# Patient Record
Sex: Female | Born: 1950
Health system: Southern US, Community
[De-identification: ages and names within clinical notes are randomized; demographics above are authoritative.]

## PROBLEM LIST (undated history)

## (undated) DIAGNOSIS — I10 Essential (primary) hypertension: Secondary | ICD-10-CM

## (undated) DIAGNOSIS — N200 Calculus of kidney: Secondary | ICD-10-CM

## (undated) DIAGNOSIS — R112 Nausea with vomiting, unspecified: Secondary | ICD-10-CM

## (undated) DIAGNOSIS — R519 Headache, unspecified: Secondary | ICD-10-CM

## (undated) DIAGNOSIS — L409 Psoriasis, unspecified: Secondary | ICD-10-CM

## (undated) DIAGNOSIS — Z91018 Allergy to other foods: Secondary | ICD-10-CM

## (undated) DIAGNOSIS — Z9889 Other specified postprocedural states: Secondary | ICD-10-CM

## (undated) DIAGNOSIS — E8809 Other disorders of plasma-protein metabolism, not elsewhere classified: Secondary | ICD-10-CM

## (undated) DIAGNOSIS — F32A Depression, unspecified: Secondary | ICD-10-CM

## (undated) DIAGNOSIS — Z9861 Coronary angioplasty status: Principal | ICD-10-CM

## (undated) DIAGNOSIS — F329 Major depressive disorder, single episode, unspecified: Secondary | ICD-10-CM

## (undated) DIAGNOSIS — A4902 Methicillin resistant Staphylococcus aureus infection, unspecified site: Secondary | ICD-10-CM

## (undated) DIAGNOSIS — F411 Generalized anxiety disorder: Secondary | ICD-10-CM

## (undated) DIAGNOSIS — I2510 Atherosclerotic heart disease of native coronary artery without angina pectoris: Secondary | ICD-10-CM

## (undated) DIAGNOSIS — I219 Acute myocardial infarction, unspecified: Secondary | ICD-10-CM

## (undated) DIAGNOSIS — E785 Hyperlipidemia, unspecified: Secondary | ICD-10-CM

## (undated) DIAGNOSIS — G4700 Insomnia, unspecified: Secondary | ICD-10-CM

## (undated) DIAGNOSIS — F419 Anxiety disorder, unspecified: Secondary | ICD-10-CM

## (undated) DIAGNOSIS — I214 Non-ST elevation (NSTEMI) myocardial infarction: Secondary | ICD-10-CM

## (undated) DIAGNOSIS — R079 Chest pain, unspecified: Secondary | ICD-10-CM

## (undated) DIAGNOSIS — R51 Headache: Secondary | ICD-10-CM

## (undated) DIAGNOSIS — T8859XA Other complications of anesthesia, initial encounter: Secondary | ICD-10-CM

## (undated) DIAGNOSIS — T4145XA Adverse effect of unspecified anesthetic, initial encounter: Secondary | ICD-10-CM

## (undated) HISTORY — DX: Atherosclerotic heart disease of native coronary artery without angina pectoris: I25.10

## (undated) HISTORY — DX: Major depressive disorder, single episode, unspecified: F32.9

## (undated) HISTORY — DX: Insomnia, unspecified: G47.00

## (undated) HISTORY — DX: Depression, unspecified: F32.A

## (undated) HISTORY — DX: Anxiety disorder, unspecified: F41.9

## (undated) HISTORY — PX: BREAST BIOPSY: SHX20

## (undated) HISTORY — PX: APPENDECTOMY: SHX54

## (undated) HISTORY — DX: Psoriasis, unspecified: L40.9

## (undated) HISTORY — PX: INCISION AND DRAINAGE FOOT: SHX1800

## (undated) HISTORY — PX: NOSE SURGERY: SHX723

## (undated) HISTORY — DX: Coronary angioplasty status: Z98.61

## (undated) HISTORY — DX: Methicillin resistant Staphylococcus aureus infection, unspecified site: A49.02

## (undated) HISTORY — DX: Generalized anxiety disorder: F41.1

---

## 1977-08-07 HISTORY — PX: DILATION AND CURETTAGE OF UTERUS: SHX78

## 1999-07-08 ENCOUNTER — Encounter: Admission: RE | Admit: 1999-07-08 | Discharge: 1999-07-08 | Payer: Self-pay | Admitting: *Deleted

## 1999-07-08 ENCOUNTER — Encounter: Payer: Self-pay | Admitting: *Deleted

## 2000-02-09 ENCOUNTER — Other Ambulatory Visit: Admission: RE | Admit: 2000-02-09 | Discharge: 2000-02-09 | Payer: Self-pay | Admitting: *Deleted

## 2000-07-24 ENCOUNTER — Encounter: Admission: RE | Admit: 2000-07-24 | Discharge: 2000-07-24 | Payer: Self-pay | Admitting: *Deleted

## 2000-07-24 ENCOUNTER — Encounter: Payer: Self-pay | Admitting: *Deleted

## 2001-04-01 ENCOUNTER — Other Ambulatory Visit: Admission: RE | Admit: 2001-04-01 | Discharge: 2001-04-01 | Payer: Self-pay | Admitting: *Deleted

## 2001-08-19 ENCOUNTER — Encounter: Admission: RE | Admit: 2001-08-19 | Discharge: 2001-08-19 | Payer: Self-pay | Admitting: *Deleted

## 2001-08-19 ENCOUNTER — Encounter: Payer: Self-pay | Admitting: *Deleted

## 2001-08-23 ENCOUNTER — Encounter: Payer: Self-pay | Admitting: *Deleted

## 2001-08-23 ENCOUNTER — Encounter: Admission: RE | Admit: 2001-08-23 | Discharge: 2001-08-23 | Payer: Self-pay | Admitting: *Deleted

## 2002-02-13 ENCOUNTER — Encounter: Payer: Self-pay | Admitting: *Deleted

## 2002-02-13 ENCOUNTER — Encounter: Admission: RE | Admit: 2002-02-13 | Discharge: 2002-02-13 | Payer: Self-pay | Admitting: *Deleted

## 2002-09-16 ENCOUNTER — Encounter: Admission: RE | Admit: 2002-09-16 | Discharge: 2002-09-16 | Payer: Self-pay | Admitting: Radiology

## 2004-01-27 ENCOUNTER — Other Ambulatory Visit: Admission: RE | Admit: 2004-01-27 | Discharge: 2004-01-27 | Payer: Self-pay | Admitting: Obstetrics & Gynecology

## 2004-02-10 ENCOUNTER — Encounter: Admission: RE | Admit: 2004-02-10 | Discharge: 2004-02-10 | Payer: Self-pay | Admitting: Obstetrics & Gynecology

## 2005-05-01 ENCOUNTER — Encounter: Admission: RE | Admit: 2005-05-01 | Discharge: 2005-05-01 | Payer: Self-pay | Admitting: Obstetrics & Gynecology

## 2005-07-04 ENCOUNTER — Other Ambulatory Visit: Admission: RE | Admit: 2005-07-04 | Discharge: 2005-07-04 | Payer: Self-pay | Admitting: Obstetrics & Gynecology

## 2006-05-02 ENCOUNTER — Encounter: Admission: RE | Admit: 2006-05-02 | Discharge: 2006-05-02 | Payer: Self-pay | Admitting: Obstetrics & Gynecology

## 2007-05-13 ENCOUNTER — Encounter: Admission: RE | Admit: 2007-05-13 | Discharge: 2007-05-13 | Payer: Self-pay | Admitting: Obstetrics & Gynecology

## 2007-12-18 ENCOUNTER — Inpatient Hospital Stay (HOSPITAL_COMMUNITY): Admission: EM | Admit: 2007-12-18 | Discharge: 2007-12-24 | Payer: Self-pay | Admitting: Emergency Medicine

## 2007-12-18 ENCOUNTER — Ambulatory Visit: Payer: Self-pay | Admitting: Internal Medicine

## 2008-01-03 ENCOUNTER — Telehealth (INDEPENDENT_AMBULATORY_CARE_PROVIDER_SITE_OTHER): Payer: Self-pay | Admitting: Internal Medicine

## 2008-01-16 ENCOUNTER — Ambulatory Visit: Payer: Self-pay | Admitting: Internal Medicine

## 2008-01-17 DIAGNOSIS — F4321 Adjustment disorder with depressed mood: Secondary | ICD-10-CM | POA: Insufficient documentation

## 2008-01-17 DIAGNOSIS — L02619 Cutaneous abscess of unspecified foot: Secondary | ICD-10-CM | POA: Insufficient documentation

## 2008-01-17 DIAGNOSIS — L03119 Cellulitis of unspecified part of limb: Secondary | ICD-10-CM | POA: Insufficient documentation

## 2008-05-28 ENCOUNTER — Encounter: Admission: RE | Admit: 2008-05-28 | Discharge: 2008-05-28 | Payer: Self-pay | Admitting: Obstetrics & Gynecology

## 2009-02-12 ENCOUNTER — Ambulatory Visit: Payer: Self-pay | Admitting: Infectious Diseases

## 2009-02-12 ENCOUNTER — Encounter (INDEPENDENT_AMBULATORY_CARE_PROVIDER_SITE_OTHER): Payer: Self-pay | Admitting: Internal Medicine

## 2009-02-12 DIAGNOSIS — R030 Elevated blood-pressure reading, without diagnosis of hypertension: Secondary | ICD-10-CM | POA: Insufficient documentation

## 2009-02-12 DIAGNOSIS — T4145XA Adverse effect of unspecified anesthetic, initial encounter: Secondary | ICD-10-CM | POA: Insufficient documentation

## 2009-02-17 LAB — CONVERTED CEMR LAB
Calcium: 9.9 mg/dL (ref 8.4–10.5)
Chloride: 108 meq/L (ref 96–112)
Cholesterol: 298 mg/dL — ABNORMAL HIGH (ref 0–200)
LDL Cholesterol: 191 mg/dL — ABNORMAL HIGH (ref 0–99)
Potassium: 4.4 meq/L (ref 3.5–5.3)
Sodium: 142 meq/L (ref 135–145)
Total CHOL/HDL Ratio: 5.4
Triglycerides: 262 mg/dL — ABNORMAL HIGH (ref <150)
VLDL: 52 mg/dL — ABNORMAL HIGH (ref 0–40)

## 2009-02-23 ENCOUNTER — Encounter (INDEPENDENT_AMBULATORY_CARE_PROVIDER_SITE_OTHER): Payer: Self-pay | Admitting: Internal Medicine

## 2009-05-31 ENCOUNTER — Encounter (INDEPENDENT_AMBULATORY_CARE_PROVIDER_SITE_OTHER): Payer: Self-pay | Admitting: Internal Medicine

## 2009-05-31 ENCOUNTER — Encounter: Admission: RE | Admit: 2009-05-31 | Discharge: 2009-05-31 | Payer: Self-pay | Admitting: Obstetrics & Gynecology

## 2009-05-31 LAB — HM MAMMOGRAPHY

## 2010-06-01 ENCOUNTER — Encounter: Admission: RE | Admit: 2010-06-01 | Discharge: 2010-06-01 | Payer: Self-pay | Admitting: Obstetrics & Gynecology

## 2010-09-15 ENCOUNTER — Encounter: Payer: Self-pay | Admitting: Internal Medicine

## 2010-12-20 NOTE — Op Note (Signed)
NAME:  Laura Herrera, Laura Herrera NO.:  [id number]   MEDICAL RECORD NO.:  [id number]          PATIENT TYPE:  INP   LOCATION:  5159                         FACILITY:  [facility]   PHYSICIAN:  Madlyn Frankel. Charlann Boxer, M.D.  DATE OF BIRTH:  25-Dec-1950   DATE OF PROCEDURE:  12/21/2007  DATE OF DISCHARGE:                               OPERATIVE REPORT   PREOPERATIVE DIAGNOSIS:  Right dorsal foot abscess.   POSTOPERATIVE DIAGNOSIS:  Right dorsal foot abscess.   PROCEDURE:  Incision, debridement, and drainage of right foot abscess  obtaining cultures.   FINDINGS:  Purulence dorsal aspect of the right foot, tenuous wound  directly over abscess.   SURGEON:  Shelda Pal, MD   ASSISTANT:  None.   ANESTHESIA:  General LMA.   ESTIMATED BLOOD LOSS:  Minimal.   COMPLICATIONS:  None.   TOURNIQUET:  An Esmarch was utilized for exsanguination and tourniquet  use.  Tourniquet was only up for approximately 10 minutes.  The patient  was otherwise stable to recovery room without complication.   INDICATIONS FOR PROCEDURE:  Laura Herrera is a 60 year old female with 5-to  6-day progressively worsening pain, swelling, and erythema over the  dorsal aspect of foot with migration in the lower leg.  She was admitted  to the hospital on Dec 18, 2007 and placed on IV antibiotics with some  response.  Questionable whether or not this was a spider bite.  Reviewed  her risk and benefits of operative procedure, she wished to proceed with  the I&D of the foot in order to help obtain relief and identify cultures  and prevent further wound complications.  I discussed the risk and wound  breakdown, potential slough, and the need for skin grafting versus  secondary wound closure.  Consent was obtained.   PROCEDURE IN DETAIL:  The patient was brought to the operative theater.  Once the adequate anesthesia was established, the patient was positioned  supine.  The patient had been receiving doxycycline on the floor.   No  preoperative meds given per se.  The right lower extremity was then  prepped and draped in sterile fashion from the toes to the upper leg.  Esmarch tourniquet was used to exsanguinate the leg, but then used as a  tourniquet.   I made a 2-cm incision lateral to the area of her most significant wound  at the center portion of the cellulitic change.  I used a tonsil to open  up and identified purulent material.  Cultures were obtained for aerobic  and anaerobic evaluation.  Following debridement of some of the tissue  in this wound, I irrigated the wound out with 2 L of normal saline  solution.  Further debridement was carried out as necessary.  Two single  interrupted nylon suture were then placed on the wounds for  reapproximation and sealed wound.  I then cleaned the foot and dressed  it with some  Xeroform and then dressings in a Kerlix.  She was awoken from anesthesia  and brought to the recovery room in stable condition.  She will continue  on her  current medications, pain medicines, and be weightbearing as  tolerated with the use of a cast shoe.      Madlyn Frankel Charlann Boxer, M.D.  Electronically Signed     MDO/MEDQ  D:  12/21/2007  T:  12/21/2007  Job:  [id number]

## 2010-12-20 NOTE — Consult Note (Signed)
NAME:  CALENE, PARADISO NO.:  [id number]   MEDICAL RECORD NO.:  [id number]          PATIENT TYPE:  INP   LOCATION:  5159                         FACILITY:  [facility]   PHYSICIAN:  Madlyn Frankel. Charlann Boxer, M.D.  DATE OF BIRTH:  01-27-1951   DATE OF CONSULTATION:  12/21/2007  DATE OF DISCHARGE:                                 CONSULTATION   CHIEF COMPLAINT:  Right foot abscess.   HISTORY OF PRESENT ILLNESS:  Ms. Dress is a pleasant 60 year old female  who presented to the emergency room yesterday after persistent swelling  despite some treatment with oral antibiotics over the right dorsal foot,  and she reports a 5-day history of swelling and increasing pain after  going to dinner one evening, putting on some shoes that she has not worn  in a while.  She cannot recall exactly if there was a spider bite, but  gives it a high-end sense of possibility [place]  Nonetheless, she had an initial pimple appearance over  the dorsal foot, which increased in its erythema.  She had some  progressive erythema despite being placed on some antibiotics after seen  at an Urgent Care, and diagnosed with a staph infection.   Given the persistence of the swelling increasing into the leg, she came  to the emergency room.  She has denied any fevers, chills, or night  sweats.  She has not had any nausea or vomiting.  She is having  difficult time mobilizing due to the discomfort in this foot.  She is  very sore to report.   PAST MEDICAL HISTORY:  Includes psoriasis, history of a right leg staph  infection due to Enbrel injection for this psoriasis.   DRUG ALLERGIES:  Reaction to ANESTHESIA when she was 60 years old.   CURRENT MEDICATIONS:  At the time of admission included rifampin,  Vicodin, Altabax, Celexa, and Ambien.   SOCIAL HISTORY:  She is a former smoker.  Has two glasses of wine a day.  No other drug use.  Socially, she is widowed.  She works as an  Arts development officer.   FAMILY MEDICAL HISTORY:  Includes coronary artery disease and dementia.   PHYSICAL EXAMINATION:  GENERAL:  General medical exam is deferred due to  the medical admission note.  EXTREMITIES:  Examination of the extremities of the dorsal foot reveals  a significant area of erythema over the entire dorsal aspect of the  foot.  She says that it has improved with the use of doxycycline  currently.  She does have an area central that almost has a necrotic-  appearing skin area with elevation.  It is very tender.  She has intact  sensibility distally in her toes, and has palpable pulses in the  posterior tib.  Distribution of the dorsal pedal pulse is not palpable  due to the location of the abscess.   She has no significant streaking at this point, and no evidence of  migratory cellulitis at this point.   ASSESSMENT:  Right dorsal foot abscess.   LABS:  She had normal electrolyte values at the time of evaluation  without any kidney function failure or decline.  She had a white count  of 7.6, hematocrit of 37.2, and platelets 303.  Blood cultures performed  on Dec 18, 2007, are negative to date.   PLAN:  I reviewed Ms. Chenette current situation and plan.  I currently  recommended an I&D of this wound.  Obvious staph infections is of  concern as is the possibility of this being a brown recluse spider bite,  which has the potential to be locally a kind of aggressive.  I discussed  with her possible need for repeat I&D, and possible need for skin  grafting.   At this point, I will plan to do a simple I&D with primary partial wound  closure dressage.  Trace dressing applied.  Consult the Wound Care  Service for potential recommendations.  If the dorsal wound breaks down,  then initial wet-to-dry dressing will be attempted to try to get this to  heal with secondary intent.  Questions were encouraged and answered were  reviewed with Ms. Stubblefield this morning.  She will be  taken in, on  operating on this wound.  She is currently n.p.o.      Madlyn Frankel Charlann Boxer, M.D.  Electronically Signed     MDO/MEDQ  D:  12/21/2007  T:  12/21/2007  Job:  [id number]

## 2010-12-23 NOTE — Discharge Summary (Signed)
NAME:  Laura Herrera, PAGLIARO NO.:  [id number]   MEDICAL RECORD NO.:  [id number]          PATIENT TYPE:  INP   LOCATION:  5159                         FACILITY:  [facility]   PHYSICIAN:  Madaline Guthrie, M.D.    DATE OF BIRTH:  07-23-1951   DATE OF ADMISSION:  12/18/2007  DATE OF DISCHARGE:  12/24/2007                               DISCHARGE SUMMARY   DISCHARGE DIAGNOSES:  1. Cellulitis and abscess of right foot, positive for methicillin-      resistant Staphylococcus aureus.  2. Nausea and vomiting secondary to narcotics.  3. History of psoriasis.  4. History of depression and insomnia.   DISCHARGE MEDICATIONS:  1. Doxycycline 100 mg twice daily for 2 more weeks.  2. Vicodin 5/325 one to two tablets every 4-6 hours as required for      pain.  3. Zofran 4 mg 1 tablet as required for nausea up to 3 times a day.  4. Scopolamine patch 1.5 mg to apply behind the ear every 3 days.  5. Celexa 40 mg 1 tablet daily.  6. Ambien 10 mg 1 tablet daily.   DISPOSITION AND FOLLOWUP:  1. The patient will follow with Dr. Charlann Boxer, phone number [phone] in      about a week's time.  Dr. Nilsa Nutting office is going to call the      patient with a followup appointment.  2. The patient will follow with Dr. Polly Cobia at the [facility].  A followup appointment has been set for January 16, 2008 at 1:30 p.m.  At the followup visit, the patient will be      reviewed for resolution of her right foot wound.  Also the      patient's blood pressure will be monitored, as it was noted to be      on the higher side during this hospital admission.  Any other      primary care needs will be addressed at the followup visit.   STUDIES/PROCEDURES:  1. X-ray, right foot, Dec 18, 2007.  No acute bony findings, no      periosteal reaction, and no acute osseous findings.  2. Incision, debridement, and drainage of right foot abscess, Dec 21, 2007 performed by Dr. Charlann Boxer.   CONSULTATIONS:  Orthopedics, Dr. Shelda Pal.   ADMISSION HISTORY:  Ms. Righetti was an otherwise healthy 60 year old white  lady who presented to the ED on Dec 18, 2007 with 5-day history of right  foot swelling and redness.  It started as a small reddish bump on the  dorsum of her right foot that looked like an insect bite.  However, she  denied having been bitten by an insect.  The redness and swelling  gradually progressed to involve the entire right foot.  The patient went  to Urgent Care 2 days prior to admission where they diagnosed possible  staph infection and placed her on rifampin and Bactrim.  Other than the  leg swelling, she mentioned of fever with chills and sweats.  She denied  any nausea, vomiting, chest pain, shortness of breath, prolonged  immobility or previous history of DVT or pulmonary embolism, weight loss  or any problem with her bowel or bladder.  She did, however, mention of  having had staphylococcus infection on the right leg in the past.   PHYSICAL EXAMINATION:  VITAL SIGNS:  Temperature 97, blood pressure  144/75, pulse 76, respiratory rate 18, oxygen saturation 99% on room  air.  GENERAL:  NAD.  EYES:  EOMI, PERRL, no icterus, no pallor.  ENT:  Moist mucous membranes, oropharynx clear.  NECK:  Supple.  No cervical lymphadenopathy.  CHEST:  Clear to auscultation bilaterally with good air entry.  CARDIOVASCULAR:  Regular rate and rhythm.  Normal heart sounds.  No  murmur, rubs, or gallops.  ABDOMEN:  Soft, nontender, nondistended.  Positive bowel sounds.  EXTREMITIES:  Right foot grossly swollen, reddish/purplish color.  Inflammation extending up to mid part of right leg, warm and exquisitely  tender to touch, good capillary refill, sensation intact.  SKIN:  Scaly skin consistent with psoriasis on hand and sole of feet.  LYMPH:  No lymphadenopathy.  MUSCULOSKELETAL:  No joint or spine tenderness or swelling.  NEURO:  Alert and oriented x3.  Cranial  nerves II-XII intact.  No focal  motor or sensory deficits.  PSYCH:  Appropriate.   ADMISSION LABORATORY DATA:  Sodium 135, potassium 3.9, chloride 103,  bicarbonate 26, BUN 13, creatinine 0.89, blood glucose 102.  WBC 12.5,  ANC 10.5, hemoglobin 14.4, MCV 93.7, platelet 300.  Bilirubin 1.7, alk  phos 92, AST 26, ALT 35, protein 7.6, albumin 4.2, calcium 9.7.   HOSPITAL COURSE:  1. Cellulitis and abscess of right foot.  The patient was admitted to      a regular floor for further management on her problem since she had      already received a course of oral antibiotics.  It was likely that      she was having resistant infection namely MRSA.  She was initially      placed on broad-spectrum vancomycin and Zosyn.  She did not      tolerate vancomycin very well as she developed generalized      erythematous reaction to it.  She was given Benadryl and vancomycin      but were discontinued and switched to IV doxycycline.  Her Zosyn      was also discontinued on day 2.  A wound culture was obtained which      revealed few MRSA that was sensitive to doxycycline.  Her      cellulitis decreased remarkably after initiation of IV antibiotics      but swelling persisted.  An orthopedic consultation was done      following which she underwent incision and drainage procedure.      Following I and D, she progressed very well and upon discharge, she      was placed on oral antibiotics which she will continue for 2 more      weeks to complete a 3 weeks course.  2. Psoriasis.  This was stable.  No medications were given for this      particular problem.  3. Insomnia and depression.  Her regular medications of Effexor and      Ambien were continued.  4. Nausea and vomiting.  This was most likely related to strong      opiates that she was receiving.  This responded to Zofran and  scopolamine patch.  5. Blood pressure.  The patient's blood pressure was noted to be on      the higher side during the  hospital admission, but this might have      been related to her pain.  This will be monitored on outpatient      basis and if need be antihypertensive medicines will be considered.   DISCHARGE LABORATORY DATA:  WBC 6.7, hemoglobin 12, platelet 300.  Sodium 140, potassium 3.9, chloride 111, bicarbonate 26, glucose 105,  BUN 15, creatinine 0.77, calcium 9.3.   DISCHARGE VITAL SIGNS:  Temperature 98.1, pulse 66, respirations 18,  blood pressure 149/59, oxygen saturation 96% on room air.   The patient's condition at the day of discharge was quite stable. Her  nausea and vomiting had subsided, swelling and cellulitis on the right  foot had remarkably decreased, and she was not complaining of any pain.  She was mobile and was able to bear weight on her right side.      Zara Council, MD  Electronically Signed      Madaline Guthrie, M.D.  Electronically Signed    AS/MEDQ  D:  12/25/2007  T:  12/25/2007  Job:  [id number]   cc:   Madlyn Frankel Charlann Boxer, M.D.

## 2011-05-03 LAB — CBC
Hemoglobin: 12
Hemoglobin: 12
Hemoglobin: 13.7
MCHC: 34.7
MCHC: 35
MCV: 93.9
MCV: 94.4
MCV: 94.5
MCV: 94.5
Platelets: 255
Platelets: 303
Platelets: 371
RBC: 3.67 — ABNORMAL LOW
RBC: 4.13
RDW: 12.1
RDW: 12.4
WBC: 6.7
WBC: 7
WBC: 8.8

## 2011-05-03 LAB — BASIC METABOLIC PANEL
BUN: 10
BUN: 11
BUN: 13
CO2: 25
CO2: 26
Calcium: 9.3
Calcium: 9.6
Chloride: 106
Chloride: 107
Creatinine, Ser: 0.77
Creatinine, Ser: 0.78
Creatinine, Ser: 0.81
Creatinine, Ser: 0.88
GFR calc Af Amer: >60
GFR calc Af Amer: >60
GFR calc Af Amer: >60
GFR calc Af Amer: >60
GFR calc Af Amer: >60
GFR calc non Af Amer: >60
GFR calc non Af Amer: >60
Glucose, Bld: 105 — ABNORMAL HIGH
Potassium: 3.9
Sodium: 137
Sodium: 140
Sodium: 140

## 2011-05-03 LAB — DIFFERENTIAL
Basophils Relative: 0
Eosinophils Relative: 4
Monocytes Absolute: 0.7
Monocytes Relative: 8
Neutrophils Relative %: 54

## 2011-05-03 LAB — ANAEROBIC CULTURE

## 2011-05-03 LAB — CULTURE, ROUTINE-ABSCESS

## 2011-05-03 LAB — HEPATIC FUNCTION PANEL
Albumin: 3.4 — ABNORMAL LOW
Alkaline Phosphatase: 76
Bilirubin, Direct: 0.1
Total Protein: 6.2

## 2011-05-29 ENCOUNTER — Other Ambulatory Visit: Payer: Self-pay | Admitting: Obstetrics & Gynecology

## 2011-05-29 DIAGNOSIS — Z1231 Encounter for screening mammogram for malignant neoplasm of breast: Secondary | ICD-10-CM

## 2011-06-15 ENCOUNTER — Ambulatory Visit
Admission: RE | Admit: 2011-06-15 | Discharge: 2011-06-15 | Disposition: A | Discharge status: Home / Self Care | Payer: BC Managed Care – PPO | Source: Ambulatory Visit | Attending: Obstetrics & Gynecology | Admitting: Obstetrics & Gynecology

## 2011-06-15 DIAGNOSIS — Z1231 Encounter for screening mammogram for malignant neoplasm of breast: Secondary | ICD-10-CM

## 2012-06-12 ENCOUNTER — Other Ambulatory Visit: Payer: Self-pay | Admitting: Obstetrics & Gynecology

## 2012-06-12 DIAGNOSIS — Z1231 Encounter for screening mammogram for malignant neoplasm of breast: Secondary | ICD-10-CM

## 2012-07-02 ENCOUNTER — Ambulatory Visit
Admission: RE | Admit: 2012-07-02 | Discharge: 2012-07-02 | Disposition: A | Discharge status: Home / Self Care | Payer: BC Managed Care – PPO | Source: Ambulatory Visit | Attending: Family Medicine | Admitting: Family Medicine

## 2012-07-02 ENCOUNTER — Other Ambulatory Visit: Payer: Self-pay | Admitting: Family Medicine

## 2012-07-02 DIAGNOSIS — M79672 Pain in left foot: Secondary | ICD-10-CM

## 2012-07-23 ENCOUNTER — Ambulatory Visit
Admission: RE | Admit: 2012-07-23 | Discharge: 2012-07-23 | Disposition: A | Discharge status: Home / Self Care | Payer: BC Managed Care – PPO | Source: Ambulatory Visit | Attending: Obstetrics & Gynecology | Admitting: Obstetrics & Gynecology

## 2012-07-23 DIAGNOSIS — Z1231 Encounter for screening mammogram for malignant neoplasm of breast: Secondary | ICD-10-CM

## 2012-11-27 ENCOUNTER — Emergency Department (HOSPITAL_COMMUNITY)
Admission: EM | Admit: 2012-11-27 | Discharge: 2012-11-28 | Disposition: A | Discharge status: Home / Self Care | Payer: BC Managed Care – PPO | Attending: Emergency Medicine | Admitting: Emergency Medicine

## 2012-11-27 ENCOUNTER — Encounter (HOSPITAL_COMMUNITY): Payer: Self-pay | Admitting: *Deleted

## 2012-11-27 DIAGNOSIS — F411 Generalized anxiety disorder: Secondary | ICD-10-CM | POA: Insufficient documentation

## 2012-11-27 DIAGNOSIS — R0982 Postnasal drip: Secondary | ICD-10-CM | POA: Insufficient documentation

## 2012-11-27 DIAGNOSIS — Z872 Personal history of diseases of the skin and subcutaneous tissue: Secondary | ICD-10-CM | POA: Insufficient documentation

## 2012-11-27 DIAGNOSIS — J3489 Other specified disorders of nose and nasal sinuses: Secondary | ICD-10-CM | POA: Insufficient documentation

## 2012-11-27 DIAGNOSIS — G4700 Insomnia, unspecified: Secondary | ICD-10-CM | POA: Insufficient documentation

## 2012-11-27 DIAGNOSIS — Z8614 Personal history of Methicillin resistant Staphylococcus aureus infection: Secondary | ICD-10-CM | POA: Insufficient documentation

## 2012-11-27 DIAGNOSIS — Z79899 Other long term (current) drug therapy: Secondary | ICD-10-CM | POA: Insufficient documentation

## 2012-11-27 DIAGNOSIS — Z8659 Personal history of other mental and behavioral disorders: Secondary | ICD-10-CM | POA: Insufficient documentation

## 2012-11-27 DIAGNOSIS — J029 Acute pharyngitis, unspecified: Secondary | ICD-10-CM | POA: Insufficient documentation

## 2012-11-27 DIAGNOSIS — J309 Allergic rhinitis, unspecified: Secondary | ICD-10-CM | POA: Insufficient documentation

## 2012-11-27 DIAGNOSIS — H1045 Other chronic allergic conjunctivitis: Secondary | ICD-10-CM | POA: Insufficient documentation

## 2012-11-27 NOTE — ED Notes (Signed)
Pt states she has times where she can not breath well, very anxious upon arrival, feels she has some congestion, tearful in triage

## 2012-11-28 ENCOUNTER — Emergency Department (HOSPITAL_COMMUNITY): Payer: BC Managed Care – PPO

## 2012-11-28 MED ORDER — PREDNISONE 20 MG PO TABS
20.0000 mg | ORAL_TABLET | Freq: Once | ORAL | Status: AC
  Administered 2012-11-28: 20 mg via ORAL
  Filled 2012-11-28: qty 1

## 2012-11-28 MED ORDER — LORATADINE 10 MG PO TABS: 10.0000 mg | ORAL_TABLET | Freq: Every day | ORAL | Status: DC

## 2012-11-28 MED ORDER — PREDNISONE 20 MG PO TABS: 20.0000 mg | ORAL_TABLET | Freq: Every day | ORAL | Status: DC

## 2012-11-28 MED ORDER — BENZONATATE 100 MG PO CAPS
200.0000 mg | ORAL_CAPSULE | Freq: Once | ORAL | Status: AC
  Administered 2012-11-28: 200 mg via ORAL
  Filled 2012-11-28: qty 2

## 2012-11-28 MED ORDER — BENZONATATE 100 MG PO CAPS: 200.0000 mg | ORAL_CAPSULE | Freq: Three times a day (TID) | ORAL | Status: DC

## 2012-11-28 MED ORDER — IBUPROFEN 200 MG PO TABS
600.0000 mg | ORAL_TABLET | Freq: Once | ORAL | Status: AC
  Administered 2012-11-28: 600 mg via ORAL
  Filled 2012-11-28: qty 3

## 2012-11-28 MED ORDER — MORPHINE SULFATE 4 MG/ML IJ SOLN
INTRAMUSCULAR | Status: AC
  Filled 2012-11-28: qty 1

## 2012-11-28 MED ORDER — LORATADINE 10 MG PO TABS
10.0000 mg | ORAL_TABLET | Freq: Once | ORAL | Status: AC
  Administered 2012-11-28: 10 mg via ORAL
  Filled 2012-11-28: qty 1

## 2012-11-28 MED ORDER — PROPOFOL 10 MG/ML IV EMUL
INTRAVENOUS | Status: AC
  Filled 2012-11-28: qty 100

## 2012-11-28 MED ORDER — DIPHENHYDRAMINE HCL 25 MG PO CAPS
25.0000 mg | ORAL_CAPSULE | Freq: Once | ORAL | Status: AC
  Administered 2012-11-28: 25 mg via ORAL
  Filled 2012-11-28: qty 1

## 2012-11-28 NOTE — ED Notes (Signed)
Pt states last night started having a scratchy throat, then today had coughing episodes and throat still scratchy, states cough is dry, then states this evening has had 3 episodes where she feels like her throat closes up and she can't breathe, pt states her throat feels fine just a little scratchy at this time. Pt upset in room.

## 2012-11-28 NOTE — ED Provider Notes (Signed)
History     CSN: [id number]  Arrival date & time 11/27/12  2349   First MD Initiated Contact with Patient 11/28/12 0109      Chief Complaint  Patient presents with  . Shortness of Breath  . Sore Throat    HPI Laura Herrera is a 62 y.o. female arrives complaining of shortness of breath, sore throat, nasal congestion, itchy watery eyes. She says she's never had allergies before in her life. She denies any chest pain, productive cough, fevers or chills. Start a couple days ago has gotten worse. She has not taken anything for it. No history of venous thromboembolic disease, no history of hemoptysis, exogenous estrogens, or recent immobilization, fracture can't. No history of heart disease.  Past Medical History  Diagnosis Date  . Psoriasis   . MRSA infection     h/o MRSA infection in the right foot  . Anxiety   . Depression   . Insomnia     History reviewed. No pertinent past surgical history.  No family history on file.  History  Substance Use Topics  . Smoking status: Never Smoker   . Smokeless tobacco: Not on file  . Alcohol Use: Yes    OB History   Grav Para Term Preterm Abortions TAB SAB Ect Mult Living                  Review of Systems At least 10pt or greater review of systems completed and are negative except where specified in the HPI.  Allergies  Cholinesterase inhibitors and Vancomycin  Home Medications   Current Outpatient Rx  Name  Route  Sig  Dispense  Refill  . amLODipine (NORVASC) 2.5 MG tablet   Oral   Take 2.5 mg by mouth daily.         Marland Kitchen atorvastatin (LIPITOR) 10 MG tablet   Oral   Take 10 mg by mouth daily.         Marland Kitchen zolpidem (AMBIEN CR) 6.25 MG CR tablet   Oral   Take 6.25 mg by mouth at bedtime as needed for sleep.           BP 181/67  Pulse 67  Temp(Src) 98.3 F (36.8 C) (Oral)  Resp 20  Wt 180 lb (81.647 kg)  BMI 27.86 kg/m2  SpO2 100%  Physical Exam  Nursing notes reviewed.  Electronic medical record  reviewed. VITAL SIGNS:   Filed Vitals:   11/27/12 2351 11/28/12 0404 11/28/12 0447  BP: 181/67 164/67   Pulse: 67 72 67  Temp: 98.3 F (36.8 C) 98.8 F (37.1 C)   TempSrc: Oral Oral   Resp: 20 16 18   Weight: 180 lb (81.647 kg)    SpO2: 100% 95%    CONSTITUTIONAL: Awake, oriented x4, appears non-toxic HENT: Atraumatic, normocephalic, oral mucosa pink and moist, airway patent. Nares crusted with clear discharge, nasal turbinates are boggy and inflamed. External ears normal. TMs with small amount of clear fluid behind them. EYES: Conjunctiva injected, eyes are tearing constantly, EOMI, PERRLA NECK: Trachea midline, non-tender, supple, no lymphadenopathy CARDIOVASCULAR: Normal heart rate, Normal rhythm, No murmurs, rubs, gallops PULMONARY/CHEST: Clear to auscultation, no rhonchi, wheezes, or rales. Symmetrical breath sounds. Non-tender. ABDOMINAL: Non-distended, soft, non-tender - no rebound or guarding.  BS normal. NEUROLOGIC: Non-focal, moving all four extremities, no gross sensory or motor deficits. EXTREMITIES: No clubbing, cyanosis, or edema SKIN: Warm, Dry, No erythema, No rash  ED Course  Procedures (including critical care time)  Labs Reviewed -  No data to display No results found.   1. Allergic conjunctivitis and rhinitis, bilateral   2. Postnasal drip   3. Sore throat       MDM  Patient arrives anxious, says she cannot breathe well, does not have seasonal allergies however her physical exam is very consistent with environmental allergies/allergic rhinitis. Treated with prednisone, Claritin, Benadryl and Tessalon and reevaluate. Do not think this patient's shortness of breath is secondary to intrathoracic emergency at this time, she is low risk for PE, she's not complaining about any chest pains, she's afebrile, and nontoxic.  On reevaluation, the patient feels much better, is breathing much easier, she's not coughing, her eyes feel better she's not as congested. We'll  discharge her home on a regimen of selected antihistamines as well as a small prednisone burst to control her symptoms.  I explained the diagnosis and have given explicit precautions to return to the ER including recurring symptoms or any other new or worsening symptoms. The patient understands and accepts the medical plan as it's been dictated and I have answered their questions. Discharge instructions concerning home care and prescriptions have been given.  The patient is STABLE and is discharged to home in good condition.         Jones Skene, MD 11/28/12 [phone]

## 2013-07-07 ENCOUNTER — Other Ambulatory Visit: Payer: Self-pay

## 2013-07-07 DIAGNOSIS — Z1231 Encounter for screening mammogram for malignant neoplasm of breast: Secondary | ICD-10-CM

## 2013-08-12 ENCOUNTER — Ambulatory Visit
Admission: RE | Admit: 2013-08-12 | Discharge: 2013-08-12 | Disposition: A | Discharge status: Home / Self Care | Payer: BC Managed Care – PPO | Source: Ambulatory Visit

## 2013-08-12 DIAGNOSIS — Z1231 Encounter for screening mammogram for malignant neoplasm of breast: Secondary | ICD-10-CM

## 2013-09-29 ENCOUNTER — Ambulatory Visit
Admission: RE | Admit: 2013-09-29 | Discharge: 2013-09-29 | Disposition: A | Discharge status: Home / Self Care | Payer: BC Managed Care – PPO | Source: Ambulatory Visit | Attending: Family Medicine | Admitting: Family Medicine

## 2013-09-29 ENCOUNTER — Other Ambulatory Visit: Payer: Self-pay | Admitting: Family Medicine

## 2013-09-29 DIAGNOSIS — M79609 Pain in unspecified limb: Secondary | ICD-10-CM

## 2014-10-01 ENCOUNTER — Other Ambulatory Visit: Payer: Self-pay

## 2014-10-01 DIAGNOSIS — Z1231 Encounter for screening mammogram for malignant neoplasm of breast: Secondary | ICD-10-CM

## 2014-10-08 ENCOUNTER — Ambulatory Visit
Admission: RE | Admit: 2014-10-08 | Discharge: 2014-10-08 | Disposition: A | Discharge status: Home / Self Care | Payer: BLUE CROSS/BLUE SHIELD | Source: Ambulatory Visit

## 2014-10-08 ENCOUNTER — Other Ambulatory Visit: Payer: Self-pay

## 2014-10-08 DIAGNOSIS — Z1231 Encounter for screening mammogram for malignant neoplasm of breast: Secondary | ICD-10-CM

## 2015-01-06 DIAGNOSIS — Z9861 Coronary angioplasty status: Secondary | ICD-10-CM

## 2015-01-06 DIAGNOSIS — I219 Acute myocardial infarction, unspecified: Secondary | ICD-10-CM

## 2015-01-06 DIAGNOSIS — I2510 Atherosclerotic heart disease of native coronary artery without angina pectoris: Secondary | ICD-10-CM

## 2015-01-06 DIAGNOSIS — I2119 ST elevation (STEMI) myocardial infarction involving other coronary artery of inferior wall: Secondary | ICD-10-CM

## 2015-01-06 HISTORY — DX: Coronary angioplasty status: Z98.61

## 2015-01-06 HISTORY — DX: Atherosclerotic heart disease of native coronary artery without angina pectoris: I25.10

## 2015-01-06 HISTORY — DX: Acute myocardial infarction, unspecified: I21.9

## 2015-01-06 HISTORY — DX: ST elevation (STEMI) myocardial infarction involving other coronary artery of inferior wall: I21.19

## 2015-01-11 ENCOUNTER — Encounter (HOSPITAL_COMMUNITY)
Admission: EM | Disposition: A | Discharge status: Home / Self Care | Payer: BLUE CROSS/BLUE SHIELD | Source: Home / Self Care | Attending: Cardiology

## 2015-01-11 ENCOUNTER — Inpatient Hospital Stay (HOSPITAL_COMMUNITY)
Admission: EM | Admit: 2015-01-11 | Discharge: 2015-01-17 | DRG: 247 | Disposition: A | Discharge status: Home / Self Care | Payer: BLUE CROSS/BLUE SHIELD | Attending: Cardiology | Admitting: Cardiology

## 2015-01-11 ENCOUNTER — Ambulatory Visit (HOSPITAL_COMMUNITY): Admit: 2015-01-11 | Payer: Self-pay | Admitting: Cardiology

## 2015-01-11 ENCOUNTER — Encounter (HOSPITAL_COMMUNITY): Payer: Self-pay | Admitting: Internal Medicine

## 2015-01-11 DIAGNOSIS — Z6826 Body mass index (BMI) 26.0-26.9, adult: Secondary | ICD-10-CM

## 2015-01-11 DIAGNOSIS — F419 Anxiety disorder, unspecified: Secondary | ICD-10-CM | POA: Diagnosis present

## 2015-01-11 DIAGNOSIS — I10 Essential (primary) hypertension: Secondary | ICD-10-CM | POA: Diagnosis present

## 2015-01-11 DIAGNOSIS — Z87891 Personal history of nicotine dependence: Secondary | ICD-10-CM | POA: Diagnosis not present

## 2015-01-11 DIAGNOSIS — Z91013 Allergy to seafood: Secondary | ICD-10-CM

## 2015-01-11 DIAGNOSIS — L409 Psoriasis, unspecified: Secondary | ICD-10-CM | POA: Diagnosis present

## 2015-01-11 DIAGNOSIS — Z888 Allergy status to other drugs, medicaments and biological substances status: Secondary | ICD-10-CM

## 2015-01-11 DIAGNOSIS — I25110 Atherosclerotic heart disease of native coronary artery with unstable angina pectoris: Secondary | ICD-10-CM | POA: Diagnosis present

## 2015-01-11 DIAGNOSIS — Z881 Allergy status to other antibiotic agents status: Secondary | ICD-10-CM

## 2015-01-11 DIAGNOSIS — Z7982 Long term (current) use of aspirin: Secondary | ICD-10-CM

## 2015-01-11 DIAGNOSIS — G4700 Insomnia, unspecified: Secondary | ICD-10-CM | POA: Diagnosis present

## 2015-01-11 DIAGNOSIS — Z79899 Other long term (current) drug therapy: Secondary | ICD-10-CM

## 2015-01-11 DIAGNOSIS — Z7902 Long term (current) use of antithrombotics/antiplatelets: Secondary | ICD-10-CM

## 2015-01-11 DIAGNOSIS — F329 Major depressive disorder, single episode, unspecified: Secondary | ICD-10-CM | POA: Diagnosis present

## 2015-01-11 DIAGNOSIS — I213 ST elevation (STEMI) myocardial infarction of unspecified site: Secondary | ICD-10-CM | POA: Diagnosis not present

## 2015-01-11 DIAGNOSIS — Z8614 Personal history of Methicillin resistant Staphylococcus aureus infection: Secondary | ICD-10-CM | POA: Diagnosis not present

## 2015-01-11 DIAGNOSIS — E669 Obesity, unspecified: Secondary | ICD-10-CM | POA: Diagnosis present

## 2015-01-11 DIAGNOSIS — I2119 ST elevation (STEMI) myocardial infarction involving other coronary artery of inferior wall: Secondary | ICD-10-CM | POA: Diagnosis present

## 2015-01-11 DIAGNOSIS — Z9861 Coronary angioplasty status: Secondary | ICD-10-CM

## 2015-01-11 DIAGNOSIS — E785 Hyperlipidemia, unspecified: Secondary | ICD-10-CM | POA: Diagnosis present

## 2015-01-11 DIAGNOSIS — I2582 Chronic total occlusion of coronary artery: Secondary | ICD-10-CM | POA: Diagnosis present

## 2015-01-11 DIAGNOSIS — I2111 ST elevation (STEMI) myocardial infarction involving right coronary artery: Principal | ICD-10-CM | POA: Diagnosis present

## 2015-01-11 DIAGNOSIS — I2584 Coronary atherosclerosis due to calcified coronary lesion: Secondary | ICD-10-CM | POA: Diagnosis present

## 2015-01-11 DIAGNOSIS — Z955 Presence of coronary angioplasty implant and graft: Secondary | ICD-10-CM

## 2015-01-11 DIAGNOSIS — I2510 Atherosclerotic heart disease of native coronary artery without angina pectoris: Secondary | ICD-10-CM | POA: Insufficient documentation

## 2015-01-11 DIAGNOSIS — R079 Chest pain, unspecified: Secondary | ICD-10-CM | POA: Diagnosis present

## 2015-01-11 HISTORY — DX: Other specified postprocedural states: Z98.890

## 2015-01-11 HISTORY — DX: Hyperlipidemia, unspecified: E78.5

## 2015-01-11 HISTORY — PX: CARDIAC CATHETERIZATION: SHX172

## 2015-01-11 HISTORY — DX: Nausea with vomiting, unspecified: R11.2

## 2015-01-11 HISTORY — DX: Essential (primary) hypertension: I10

## 2015-01-11 LAB — CBC
HCT: 33.8 % — ABNORMAL LOW (ref 36.0–46.0)
HEMOGLOBIN: 11.3 g/dL — AB (ref 12.0–15.0)
MCH: 30.9 pg (ref 26.0–34.0)
MCHC: 33.4 g/dL (ref 30.0–36.0)
MCV: 92.3 fL (ref 78.0–100.0)
Platelets: 317 10*3/uL (ref 150–400)
RBC: 3.66 MIL/uL — ABNORMAL LOW (ref 3.87–5.11)
RDW: 12.8 % (ref 11.5–15.5)
WBC: 14 10*3/uL — AB (ref 4.0–10.5)

## 2015-01-11 SURGERY — LEFT HEART CATH AND CORONARY ANGIOGRAPHY
Anesthesia: LOCAL

## 2015-01-11 MED ORDER — ONDANSETRON HCL 4 MG/2ML IJ SOLN
4.0000 mg | Freq: Four times a day (QID) | INTRAMUSCULAR | Status: DC | PRN
  Administered 2015-01-12 – 2015-01-16 (×10): 4 mg via INTRAVENOUS
  Filled 2015-01-11 (×10): qty 2

## 2015-01-11 MED ORDER — FENTANYL CITRATE (PF) 100 MCG/2ML IJ SOLN
INTRAMUSCULAR | Status: DC | PRN
  Administered 2015-01-11 (×2): 25 ug via INTRAVENOUS

## 2015-01-11 MED ORDER — TIROFIBAN HCL IV 5 MG/100ML
INTRAVENOUS | Status: DC | PRN
  Administered 2015-01-11: 0.15 ug/kg/min via INTRAVENOUS

## 2015-01-11 MED ORDER — HEPARIN SODIUM (PORCINE) 5000 UNIT/ML IJ SOLN
5000.0000 [IU] | Freq: Three times a day (TID) | INTRAMUSCULAR | Status: DC
  Administered 2015-01-12 – 2015-01-17 (×15): 5000 [IU] via SUBCUTANEOUS
  Filled 2015-01-11 (×20): qty 1

## 2015-01-11 MED ORDER — MIDAZOLAM HCL 2 MG/2ML IJ SOLN
INTRAMUSCULAR | Status: AC
Start: 2015-01-11 — End: 2015-01-11
  Filled 2015-01-11: qty 2

## 2015-01-11 MED ORDER — TIROFIBAN (AGGRASTAT) BOLUS VIA INFUSION
INTRAVENOUS | Status: DC | PRN
Start: 2015-01-11 — End: 2015-01-11
  Administered 2015-01-11: 2050 ug via INTRAVENOUS

## 2015-01-11 MED ORDER — SODIUM CHLORIDE 0.9 % IJ SOLN: 3.0000 mL | INTRAMUSCULAR | Status: DC | PRN

## 2015-01-11 MED ORDER — VERAPAMIL HCL 2.5 MG/ML IV SOLN
INTRAVENOUS | Status: DC | PRN
  Administered 2015-01-11 (×2): via INTRA_ARTERIAL

## 2015-01-11 MED ORDER — ZOLPIDEM TARTRATE 5 MG PO TABS: 5.0000 mg | ORAL_TABLET | Freq: Every evening | ORAL | Status: DC | PRN

## 2015-01-11 MED ORDER — TICAGRELOR 90 MG PO TABS
ORAL_TABLET | ORAL | Status: DC | PRN
  Administered 2015-01-11: 180 mg via ORAL

## 2015-01-11 MED ORDER — MORPHINE SULFATE 2 MG/ML IJ SOLN
2.0000 mg | INTRAMUSCULAR | Status: DC | PRN
  Administered 2015-01-12 – 2015-01-15 (×7): 2 mg via INTRAVENOUS
  Filled 2015-01-11 (×7): qty 1

## 2015-01-11 MED ORDER — MIDAZOLAM HCL 2 MG/2ML IJ SOLN
INTRAMUSCULAR | Status: DC | PRN
  Administered 2015-01-11 (×2): 1 mg via INTRAVENOUS

## 2015-01-11 MED ORDER — ONDANSETRON HCL 4 MG/2ML IJ SOLN
INTRAMUSCULAR | Status: DC | PRN
  Administered 2015-01-11: 4 mg via INTRAVENOUS

## 2015-01-11 MED ORDER — NITROGLYCERIN IN D5W 200-5 MCG/ML-% IV SOLN
INTRAVENOUS | Status: DC | PRN
  Administered 2015-01-11: 5 ug/min via INTRAVENOUS

## 2015-01-11 MED ORDER — HEPARIN (PORCINE) IN NACL 2-0.9 UNIT/ML-% IJ SOLN
INTRAMUSCULAR | Status: AC
  Filled 2015-01-11: qty 1000

## 2015-01-11 MED ORDER — ACETAMINOPHEN 325 MG PO TABS
650.0000 mg | ORAL_TABLET | ORAL | Status: DC | PRN
  Administered 2015-01-12: 650 mg via ORAL
  Filled 2015-01-11: qty 2

## 2015-01-11 MED ORDER — IOHEXOL 350 MG/ML SOLN
INTRAVENOUS | Status: DC | PRN
  Administered 2015-01-11: 325 mL via INTRA_ARTERIAL

## 2015-01-11 MED ORDER — LIDOCAINE HCL (PF) 1 % IJ SOLN
INTRAMUSCULAR | Status: DC | PRN
  Administered 2015-01-11: 5 mL via INTRADERMAL

## 2015-01-11 MED ORDER — BIVALIRUDIN BOLUS VIA INFUSION - CUPID
INTRAVENOUS | Status: DC | PRN
  Administered 2015-01-11: 61.5 mg via INTRAVENOUS

## 2015-01-11 MED ORDER — SODIUM CHLORIDE 0.9 % IV SOLN: 250.0000 mL | INTRAVENOUS | Status: DC | PRN

## 2015-01-11 MED ORDER — NITROGLYCERIN 1 MG/10 ML FOR IR/CATH LAB
INTRA_ARTERIAL | Status: AC
  Filled 2015-01-11: qty 10

## 2015-01-11 MED ORDER — MIDAZOLAM HCL 2 MG/2ML IJ SOLN
INTRAMUSCULAR | Status: AC
  Filled 2015-01-11: qty 2

## 2015-01-11 MED ORDER — NITROGLYCERIN 1 MG/10 ML FOR IR/CATH LAB
INTRA_ARTERIAL | Status: DC | PRN
  Administered 2015-01-11 (×3): 200 ug via INTRACORONARY

## 2015-01-11 MED ORDER — SODIUM CHLORIDE 0.9 % IJ SOLN
3.0000 mL | Freq: Two times a day (BID) | INTRAMUSCULAR | Status: DC
  Administered 2015-01-12: 3 mL via INTRAVENOUS

## 2015-01-11 MED ORDER — ASPIRIN EC 81 MG PO TBEC
81.0000 mg | DELAYED_RELEASE_TABLET | Freq: Every day | ORAL | Status: DC
  Administered 2015-01-12 – 2015-01-17 (×6): 81 mg via ORAL
  Filled 2015-01-11 (×6): qty 1

## 2015-01-11 MED ORDER — LIDOCAINE HCL (PF) 1 % IJ SOLN
INTRAMUSCULAR | Status: AC
  Filled 2015-01-11: qty 30

## 2015-01-11 MED ORDER — NITROGLYCERIN 0.4 MG SL SUBL
0.4000 mg | SUBLINGUAL_TABLET | SUBLINGUAL | Status: DC | PRN
  Administered 2015-01-15 (×3): 0.4 mg via SUBLINGUAL
  Filled 2015-01-11: qty 1

## 2015-01-11 MED ORDER — NITROGLYCERIN IN D5W 200-5 MCG/ML-% IV SOLN
INTRAVENOUS | Status: AC
  Filled 2015-01-11: qty 250

## 2015-01-11 MED ORDER — SODIUM CHLORIDE 0.9 % WEIGHT BASED INFUSION: 1.0000 mL/kg/h | INTRAVENOUS | Status: AC

## 2015-01-11 MED ORDER — VERAPAMIL HCL 2.5 MG/ML IV SOLN
INTRAVENOUS | Status: AC
  Filled 2015-01-11: qty 2

## 2015-01-11 MED ORDER — MIDAZOLAM HCL 2 MG/2ML IJ SOLN
INTRAMUSCULAR | Status: DC | PRN
  Administered 2015-01-11: 1 mg via INTRAVENOUS

## 2015-01-11 MED ORDER — TICAGRELOR 90 MG PO TABS
90.0000 mg | ORAL_TABLET | Freq: Two times a day (BID) | ORAL | Status: DC
  Administered 2015-01-12 – 2015-01-17 (×11): 90 mg via ORAL
  Filled 2015-01-11 (×12): qty 1

## 2015-01-11 MED ORDER — BIVALIRUDIN 250 MG IV SOLR
INTRAVENOUS | Status: AC
  Filled 2015-01-11: qty 250

## 2015-01-11 MED ORDER — ONDANSETRON HCL 4 MG/2ML IJ SOLN
INTRAMUSCULAR | Status: AC
  Filled 2015-01-11: qty 2

## 2015-01-11 MED ORDER — SODIUM CHLORIDE 0.9 % IV SOLN
250.0000 mg | INTRAVENOUS | Status: DC | PRN
  Administered 2015-01-11: 1.75 mg/kg/h via INTRAVENOUS

## 2015-01-11 MED ORDER — METOPROLOL TARTRATE 12.5 MG HALF TABLET
12.5000 mg | ORAL_TABLET | Freq: Two times a day (BID) | ORAL | Status: DC
  Administered 2015-01-12 – 2015-01-17 (×10): 12.5 mg via ORAL
  Filled 2015-01-11 (×13): qty 1

## 2015-01-11 MED ORDER — ATORVASTATIN CALCIUM 80 MG PO TABS
80.0000 mg | ORAL_TABLET | Freq: Every day | ORAL | Status: DC
  Administered 2015-01-12 – 2015-01-16 (×5): 80 mg via ORAL
  Filled 2015-01-11 (×7): qty 1

## 2015-01-11 MED ORDER — NITROGLYCERIN IN D5W 200-5 MCG/ML-% IV SOLN
0.0000 ug/min | INTRAVENOUS | Status: AC
  Administered 2015-01-12: 15 ug/min via INTRAVENOUS
  Filled 2015-01-11: qty 250

## 2015-01-11 MED ORDER — FENTANYL CITRATE (PF) 100 MCG/2ML IJ SOLN
INTRAMUSCULAR | Status: AC
  Filled 2015-01-11: qty 2

## 2015-01-11 SURGICAL SUPPLY — 31 items
BALLN EMERGE MR 2.0X12 (BALLOONS) ×3
BALLN ~~LOC~~ EMERGE MR 2.75X12 (BALLOONS) ×3
BALLN ~~LOC~~ EMERGE MR 3.0X8 (BALLOONS) ×3
BALLOON EMERGE MR 2.0X12 (BALLOONS) ×2 IMPLANT
BALLOON ~~LOC~~ EMERGE MR 2.75X12 (BALLOONS) ×2 IMPLANT
BALLOON ~~LOC~~ EMERGE MR 3.0X8 (BALLOONS) ×2 IMPLANT
CATH HEARTRAIL IKARI 6F IR1.0 (CATHETERS) ×3 IMPLANT
CATH INFINITI 5 FR JL3.5 (CATHETERS) ×3 IMPLANT
CATH INFINITI 5FR ANG PIGTAIL (CATHETERS) ×3 IMPLANT
CATH INFINITI 5FR MULTPACK ANG (CATHETERS) IMPLANT
CATH INFINITI JR4 5F (CATHETERS) ×3 IMPLANT
CATH LAUNCHER 6FR 3DRIGHT (CATHETERS) ×2 IMPLANT
CATH OPTITORQUE TIG 4.0 5F (CATHETERS) ×3 IMPLANT
CATH VISTA GUIDE 6FR JR4 (CATHETERS) ×3 IMPLANT
CATHETER LAUNCHER 6FR 3DRIGHT (CATHETERS) ×3
CUTTING BAL FLEXTOME RX 2.5X6 (BALLOONS) ×3 IMPLANT
DEVICE RAD COMP TR BAND LRG (VASCULAR PRODUCTS) ×3 IMPLANT
GLIDESHEATH SLEND A-KIT 6F 22G (SHEATH) ×3 IMPLANT
KIT ENCORE 26 ADVANTAGE (KITS) ×3 IMPLANT
KIT HEART LEFT (KITS) ×3 IMPLANT
PACK CARDIAC CATHETERIZATION (CUSTOM PROCEDURE TRAY) ×3 IMPLANT
SHEATH PINNACLE 5F 10CM (SHEATH) IMPLANT
STENT PROMUS PREM MR 2.5X24 (Permanent Stent) ×3 IMPLANT
STENT PROMUS PREM MR 2.75X12 (Permanent Stent) ×3 IMPLANT
SYR MEDRAD MARK V 150ML (SYRINGE) ×3 IMPLANT
TRANSDUCER W/STOPCOCK (MISCELLANEOUS) ×3 IMPLANT
TUBING CIL FLEX 10 FLL-RA (TUBING) ×3 IMPLANT
WIRE ASAHI PROWATER 180CM (WIRE) ×6 IMPLANT
WIRE EMERALD 3MM-J .035X150CM (WIRE) IMPLANT
WIRE HI TORQ BMW 190CM (WIRE) ×3 IMPLANT
WIRE SAFE-T 1.5MM-J .035X260CM (WIRE) ×3 IMPLANT

## 2015-01-11 NOTE — H&P (Addendum)
CAREL SCHNEE is an 64 y.o. female.   Chief Complaint: chest pain Primary Cardiologist: new HPI: Ms. Sardinha is a 64 yo woman with intermittent tobacco use in the past quit a few years ago, hypertension, dyslipidemia with mother with coronary artery disease who developed chest discomfort, pressure sensation in her left breast around ~ 7 pm this evening. However, she's been having pain for 3 days intermittently. Pain worse with walking around and gradually improved with rest. She received aspirin 324 mg in the ambulance and ECG interpreted as inferior STEMI leading to cath lab activation. On arrival she was directed escorted to the cath lab by the interventionalist on call call and me. LHC via right radial artery revealed 100% proximal RCA.   Past Medical History  Diagnosis Date  . Psoriasis   . MRSA infection     h/o MRSA infection in the right foot  . Anxiety   . Depression   . Insomnia     History reviewed. No pertinent past surgical history. Family history as above History reviewed. No pertinent family history. Social History:  reports that she has quit smoking. Her smoking use included Cigarettes. She does not have any smokeless tobacco history on file. She reports that she drinks alcohol. She reports that she does not use illicit drugs.  Allergies:  Allergies  Allergen Reactions  . Cholinesterase Inhibitors   . Vancomycin     REACTION: severe rash/hives    Medications Prior to Admission  Medication Sig Dispense Refill  . amLODipine (NORVASC) 2.5 MG tablet Take 2.5 mg by mouth daily.    Marland Kitchen atorvastatin (LIPITOR) 10 MG tablet Take 10 mg by mouth daily.    . benzonatate (TESSALON) 100 MG capsule Take 2 capsules (200 mg total) by mouth every 8 (eight) hours. 21 capsule 0  . loratadine (CLARITIN) 10 MG tablet Take 1 tablet (10 mg total) by mouth daily. 30 tablet 0  . predniSONE (DELTASONE) 20 MG tablet Take 1 tablet (20 mg total) by mouth daily. 5 tablet 0  . zolpidem (AMBIEN CR)  6.25 MG CR tablet Take 6.25 mg by mouth at bedtime as needed for sleep.      No results found for this or any previous visit (from the past 48 hour(s)). No results found.  Review of Systems  Constitutional: Positive for diaphoresis. Negative for fever and chills.  HENT: Negative for ear pain.   Eyes: Negative for double vision and photophobia.  Respiratory: Negative for cough and hemoptysis.   Cardiovascular: Positive for chest pain. Negative for palpitations and orthopnea.  Gastrointestinal: Negative for nausea and vomiting.  Genitourinary: Negative for dysuria and hematuria.  Musculoskeletal: Negative for myalgias and neck pain.  Skin: Negative for rash.  Neurological: Negative for dizziness, tingling, tremors and headaches.  Endo/Heme/Allergies: Negative for polydipsia. Does not bruise/bleed easily.  Psychiatric/Behavioral: Negative for suicidal ideas and hallucinations.    Height 5' 7.32" (1.71 m), weight 82 kg (180 lb 12.4 oz), SpO2 100 %. Physical Exam  Nursing note and vitals reviewed. Constitutional: She is oriented to person, place, and time. She appears well-developed and well-nourished. She appears distressed.  HENT:  Head: Normocephalic and atraumatic.  Nose: Nose normal.  Mouth/Throat: Oropharynx is clear and moist. No oropharyngeal exudate.  Eyes: Conjunctivae and EOM are normal. Pupils are equal, round, and reactive to light. No scleral icterus.  Neck: Normal range of motion. Neck supple. No JVD present. No tracheal deviation present.  Cardiovascular: Normal rate, regular rhythm and intact distal pulses.  Exam reveals no gallop.   No murmur heard. Respiratory: Effort normal and breath sounds normal. No respiratory distress. She has no wheezes. She has no rales.  GI: Soft. Bowel sounds are normal. She exhibits no distension. There is no tenderness. There is no rebound.  Musculoskeletal: Normal range of motion. She exhibits no edema or tenderness.  Neurological: She  is alert and oriented to person, place, and time. No cranial nerve deficit. Coordination normal.  Skin: Skin is warm. No rash noted. She is diaphoretic. No erythema.  Psychiatric: She has a normal mood and affect. Her behavior is normal. Thought content normal.   labs pending ECG reviewed; infero-posterior STEMI, SR, PVCs  Assessment/Plan Ms. Delmonte is a 64 yo woman with intermittent tobacco use in the past quit a few years ago, hypertension, dyslipidemia with mother with coronary artery disease who developed chest discomfort and found to have inferior STEMI in ECG. She went directly to the cath lab and initial angiogram revealed 100% proximal RCA.  1. STEMI: received aspirin 324 mg, loaded with ticagrelor 180 mg in cath lab, received angiomax in the cath lab - admit to CCU, telemetry, daily asa 81 mg, ticagrelor 90 mg bid, atorvastatin 80 mg qHS, metoprolol 12.5 mg bid  - update TSH, BNP, Mg, hba1c, lipid panel - echocardiogram in AM if no LV-gram performed 2 Hypertension: metoprolol, hydralazine prn  3 Dyslipidemia: atorvastatin 80 mg qHS first dose now    Crystallee Werden 01/11/2015, 9:03 PM

## 2015-01-11 NOTE — Interval H&P Note (Signed)
History and Physical Interval Note:  01/11/2015 11:05 PM  Laura Herrera  has presented today for surgery, with the diagnosis of Inferior stemi -- 2 mm.  The various methods of treatment have been discussed with the patient and family. After consideration of risks, benefits and other options for treatment, the patient has consented to  Procedure(s): Left Heart Cath and Coronary Angiography (N/A) +/- PCI as a surgical intervention .  The patient's history has been reviewed, patient examined, no change in status, stable for surgery.  I have reviewed the patient's chart and labs.  Questions were answered to the patient's satisfaction.     Andrews Tener W

## 2015-01-12 ENCOUNTER — Inpatient Hospital Stay (HOSPITAL_COMMUNITY): Payer: BLUE CROSS/BLUE SHIELD

## 2015-01-12 ENCOUNTER — Encounter (HOSPITAL_COMMUNITY): Payer: Self-pay | Admitting: *Deleted

## 2015-01-12 ENCOUNTER — Encounter (HOSPITAL_COMMUNITY)
Admission: EM | Disposition: A | Discharge status: Home / Self Care | Payer: Self-pay | Source: Home / Self Care | Attending: Cardiology

## 2015-01-12 DIAGNOSIS — I25110 Atherosclerotic heart disease of native coronary artery with unstable angina pectoris: Secondary | ICD-10-CM

## 2015-01-12 DIAGNOSIS — I2119 ST elevation (STEMI) myocardial infarction involving other coronary artery of inferior wall: Secondary | ICD-10-CM

## 2015-01-12 DIAGNOSIS — R079 Chest pain, unspecified: Secondary | ICD-10-CM

## 2015-01-12 HISTORY — PX: CARDIAC CATHETERIZATION: SHX172

## 2015-01-12 HISTORY — PX: ANGIOPLASTY: SHX39

## 2015-01-12 LAB — PROTIME-INR
INR: 5.39 — AB (ref 0.00–1.49)
Prothrombin Time: 47.5 seconds — ABNORMAL HIGH (ref 11.6–15.2)

## 2015-01-12 LAB — APTT: aPTT: 153 seconds — ABNORMAL HIGH (ref 24–37)

## 2015-01-12 LAB — POCT I-STAT, CHEM 8
BUN: 11 mg/dL (ref 6–20)
BUN: 9 mg/dL (ref 6–20)
CALCIUM ION: 1.24 mmol/L (ref 1.13–1.30)
CHLORIDE: 106 mmol/L (ref 101–111)
CREATININE: 0.7 mg/dL (ref 0.44–1.00)
Calcium, Ion: 1.19 mmol/L (ref 1.13–1.30)
Chloride: 109 mmol/L (ref 101–111)
Creatinine, Ser: 0.7 mg/dL (ref 0.44–1.00)
Glucose, Bld: 102 mg/dL — ABNORMAL HIGH (ref 65–99)
Glucose, Bld: 110 mg/dL — ABNORMAL HIGH (ref 65–99)
HCT: 32 % — ABNORMAL LOW (ref 36.0–46.0)
HEMATOCRIT: 33 % — AB (ref 36.0–46.0)
Hemoglobin: 10.9 g/dL — ABNORMAL LOW (ref 12.0–15.0)
Hemoglobin: 11.2 g/dL — ABNORMAL LOW (ref 12.0–15.0)
POTASSIUM: 3.6 mmol/L (ref 3.5–5.1)
Potassium: 3.5 mmol/L (ref 3.5–5.1)
Sodium: 139 mmol/L (ref 135–145)
Sodium: 138 mmol/L (ref 135–145)
TCO2: 16 mmol/L (ref 0–100)
TCO2: 18 mmol/L (ref 0–100)

## 2015-01-12 LAB — TROPONIN I
TROPONIN I: 14.25 ng/mL — AB (ref <0.031)
TROPONIN I: 17.45 ng/mL — AB (ref <0.031)
TROPONIN I: 10.44 ng/mL — AB (ref <0.031)
Troponin I: 10.08 ng/mL (ref <0.031)

## 2015-01-12 LAB — COMPREHENSIVE METABOLIC PANEL
ALK PHOS: 75 U/L (ref 38–126)
ALT: 35 U/L (ref 14–54)
ANION GAP: 10 (ref 5–15)
AST: 90 U/L — ABNORMAL HIGH (ref 15–41)
Albumin: 3.6 g/dL (ref 3.5–5.0)
BUN: 11 mg/dL (ref 6–20)
CALCIUM: 8.6 mg/dL — AB (ref 8.9–10.3)
CO2: 18 mmol/L — ABNORMAL LOW (ref 22–32)
Chloride: 105 mmol/L (ref 101–111)
Creatinine, Ser: 0.79 mg/dL (ref 0.44–1.00)
GFR calc Af Amer: >60 mL/min (ref >60)
Glucose, Bld: 104 mg/dL — ABNORMAL HIGH (ref 65–99)
Potassium: 3.7 mmol/L (ref 3.5–5.1)
SODIUM: 133 mmol/L — AB (ref 135–145)
Total Bilirubin: 0.8 mg/dL (ref 0.3–1.2)
Total Protein: 6.2 g/dL — ABNORMAL LOW (ref 6.5–8.1)

## 2015-01-12 LAB — LIPID PANEL
Cholesterol: 147 mg/dL (ref 0–200)
HDL: 58 mg/dL (ref >40)
LDL CALC: 75 mg/dL (ref 0–99)
TRIGLYCERIDES: 69 mg/dL (ref <150)
Total CHOL/HDL Ratio: 2.5 RATIO
VLDL: 14 mg/dL (ref 0–40)

## 2015-01-12 LAB — CK TOTAL AND CKMB (NOT AT ARMC)
CK, MB: 74.3 ng/mL — ABNORMAL HIGH (ref 0.5–5.0)
Relative Index: 10.8 — ABNORMAL HIGH (ref 0.0–2.5)
Total CK: 689 U/L — ABNORMAL HIGH (ref 38–234)

## 2015-01-12 LAB — MRSA PCR SCREENING: MRSA by PCR: NEGATIVE

## 2015-01-12 LAB — MAGNESIUM: Magnesium: 1.7 mg/dL (ref 1.7–2.4)

## 2015-01-12 LAB — TSH: TSH: 3.035 u[IU]/mL (ref 0.350–4.500)

## 2015-01-12 LAB — BRAIN NATRIURETIC PEPTIDE: B NATRIURETIC PEPTIDE 5: 289.6 pg/mL — AB (ref 0.0–100.0)

## 2015-01-12 LAB — POCT ACTIVATED CLOTTING TIME: Activated Clotting Time: 337 seconds

## 2015-01-12 SURGERY — LEFT HEART CATH AND CORONARY ANGIOGRAPHY

## 2015-01-12 MED ORDER — SODIUM CHLORIDE 0.9 % IV SOLN: 250.0000 mL | INTRAVENOUS | Status: DC | PRN

## 2015-01-12 MED ORDER — SODIUM CHLORIDE 0.9 % IJ SOLN: 3.0000 mL | INTRAMUSCULAR | Status: DC | PRN

## 2015-01-12 MED ORDER — ALPRAZOLAM 0.5 MG PO TABS: 0.5000 mg | ORAL_TABLET | Freq: Every day | ORAL | Status: DC

## 2015-01-12 MED ORDER — FENTANYL CITRATE (PF) 100 MCG/2ML IJ SOLN
INTRAMUSCULAR | Status: DC | PRN
  Administered 2015-01-12 (×2): 50 ug via INTRAVENOUS

## 2015-01-12 MED ORDER — MIDAZOLAM HCL 2 MG/2ML IJ SOLN
INTRAMUSCULAR | Status: AC
  Filled 2015-01-12: qty 2

## 2015-01-12 MED ORDER — MIDAZOLAM HCL 2 MG/2ML IJ SOLN
INTRAMUSCULAR | Status: DC | PRN
  Administered 2015-01-12: 1 mg via INTRAVENOUS

## 2015-01-12 MED ORDER — ASPIRIN 81 MG PO CHEW: 81.0000 mg | CHEWABLE_TABLET | ORAL | Status: DC

## 2015-01-12 MED ORDER — SODIUM CHLORIDE 0.9 % IV BOLUS (SEPSIS)
500.0000 mL | Freq: Once | INTRAVENOUS | Status: AC
  Administered 2015-01-12: 500 mL via INTRAVENOUS

## 2015-01-12 MED ORDER — SODIUM CHLORIDE 0.9 % IJ SOLN
3.0000 mL | Freq: Two times a day (BID) | INTRAMUSCULAR | Status: DC
  Administered 2015-01-12: 10 mL via INTRAVENOUS

## 2015-01-12 MED ORDER — SODIUM CHLORIDE 0.9 % IV SOLN
INTRAVENOUS | Status: DC
  Administered 2015-01-12 – 2015-01-14 (×3): via INTRAVENOUS

## 2015-01-12 MED ORDER — HEPARIN (PORCINE) IN NACL 2-0.9 UNIT/ML-% IJ SOLN
INTRAMUSCULAR | Status: AC
Start: 2015-01-12 — End: 2015-01-12
  Filled 2015-01-12: qty 1500

## 2015-01-12 MED ORDER — NITROGLYCERIN IN D5W 200-5 MCG/ML-% IV SOLN: 0.0000 ug/min | INTRAVENOUS | Status: AC

## 2015-01-12 MED ORDER — NITROGLYCERIN IN D5W 200-5 MCG/ML-% IV SOLN
INTRAVENOUS | Status: DC | PRN
  Administered 2015-01-12: 20 ug/min via INTRAVENOUS

## 2015-01-12 MED ORDER — FENTANYL CITRATE (PF) 100 MCG/2ML IJ SOLN
INTRAMUSCULAR | Status: AC
  Filled 2015-01-12: qty 2

## 2015-01-12 MED ORDER — HYDROCODONE-ACETAMINOPHEN 5-325 MG PO TABS
1.0000 | ORAL_TABLET | ORAL | Status: DC | PRN
  Administered 2015-01-12 – 2015-01-16 (×5): 1 via ORAL
  Filled 2015-01-12 (×5): qty 1

## 2015-01-12 MED ORDER — HEPARIN SODIUM (PORCINE) 1000 UNIT/ML IJ SOLN
INTRAMUSCULAR | Status: DC | PRN
  Administered 2015-01-12: 5000 [IU] via INTRAVENOUS

## 2015-01-12 MED ORDER — NITROGLYCERIN 1 MG/10 ML FOR IR/CATH LAB
INTRA_ARTERIAL | Status: AC
  Filled 2015-01-12: qty 10

## 2015-01-12 MED ORDER — TIROFIBAN HCL IV 12.5 MG/250 ML
INTRAVENOUS | Status: DC | PRN
  Administered 2015-01-12: 0.15 ug/kg/min via INTRAVENOUS

## 2015-01-12 MED ORDER — SODIUM CHLORIDE 0.9 % WEIGHT BASED INFUSION: 1.0000 mL/kg/h | INTRAVENOUS | Status: AC

## 2015-01-12 MED ORDER — ONDANSETRON HCL 4 MG/2ML IJ SOLN: 4.0000 mg | Freq: Four times a day (QID) | INTRAMUSCULAR | Status: DC | PRN

## 2015-01-12 MED ORDER — SODIUM CHLORIDE 0.9 % IJ SOLN
3.0000 mL | Freq: Two times a day (BID) | INTRAMUSCULAR | Status: DC
  Administered 2015-01-12 – 2015-01-17 (×8): 3 mL via INTRAVENOUS

## 2015-01-12 MED ORDER — POTASSIUM CHLORIDE ER 10 MEQ PO TBCR
20.0000 meq | EXTENDED_RELEASE_TABLET | Freq: Once | ORAL | Status: AC
  Administered 2015-01-12: 20 meq via ORAL
  Filled 2015-01-12 (×2): qty 2

## 2015-01-12 MED ORDER — TIROFIBAN HCL IV 12.5 MG/250 ML
0.1500 ug/kg/min | INTRAVENOUS | Status: DC
  Administered 2015-01-12: 0.15 ug/kg/min via INTRAVENOUS
  Filled 2015-01-12: qty 250

## 2015-01-12 MED ORDER — LIDOCAINE HCL (PF) 1 % IJ SOLN
INTRAMUSCULAR | Status: AC
  Filled 2015-01-12: qty 30

## 2015-01-12 MED ORDER — IOHEXOL 350 MG/ML SOLN
INTRAVENOUS | Status: DC | PRN
  Administered 2015-01-12 (×2): 100 mL via INTRAVENOUS

## 2015-01-12 MED ORDER — LIDOCAINE HCL (PF) 1 % IJ SOLN
INTRAMUSCULAR | Status: DC | PRN
  Administered 2015-01-12: 15 mg

## 2015-01-12 MED ORDER — SODIUM CHLORIDE 0.9 % IV SOLN
INTRAVENOUS | Status: DC
  Administered 2015-01-12 – 2015-01-13 (×3): via INTRAVENOUS

## 2015-01-12 MED ORDER — TIROFIBAN HCL IV 12.5 MG/250 ML
0.1500 ug/kg/min | INTRAVENOUS | Status: AC
  Administered 2015-01-12 – 2015-01-13 (×3): 0.15 ug/kg/min via INTRAVENOUS
  Filled 2015-01-12 (×4): qty 250

## 2015-01-12 MED ORDER — ACETAMINOPHEN 325 MG PO TABS: 650.0000 mg | ORAL_TABLET | ORAL | Status: DC | PRN

## 2015-01-12 SURGICAL SUPPLY — 14 items
CATH HEARTRAIL IKARI 6F IR1.0 (CATHETERS) ×3 IMPLANT
CATH VISTA GUIDE 6FR IM 90 CM (CATHETERS) ×3 IMPLANT
DEVICE WIRE ANGIOSEAL 6FR (Vascular Products) ×3 IMPLANT
GUIDE CATH RUNWAY 6FR AR1 (CATHETERS) ×3 IMPLANT
GUIDE CATH RUNWAY 6FR FR3.5 (CATHETERS) ×3 IMPLANT
GUIDE CATH RUNWAY 6FR FR4 (CATHETERS) ×3 IMPLANT
KIT ENCORE 26 ADVANTAGE (KITS) ×3 IMPLANT
KIT HEART LEFT (KITS) ×3 IMPLANT
PACK CARDIAC CATHETERIZATION (CUSTOM PROCEDURE TRAY) ×3 IMPLANT
SHEATH PINNACLE 6F 10CM (SHEATH) ×3 IMPLANT
TRANSDUCER W/STOPCOCK (MISCELLANEOUS) ×3 IMPLANT
TUBING CIL FLEX 10 FLL-RA (TUBING) ×3 IMPLANT
WIRE ASAHI PROWATER 180CM (WIRE) ×3 IMPLANT
WIRE EMERALD 3MM-J .035X150CM (WIRE) IMPLANT

## 2015-01-12 NOTE — Progress Notes (Signed)
Patient taken to  Cath lab at beginning of shift (approx 0800) report given to cath lab staff in lab,  She returned to unit via bed approx 0920 complaining of chest pain.  Per report from cath lab staff pain has not been relieved at all and is constant even after morphine and fentanyl IVp.  Physician restarted aggrestat infusing upon arrival back to unit, NTG gtt increased while in cath lab as well.  She is alert and oriented, o2 in place via , sats 99%, iv fluids infusing at 100 cc /hr due to lower blood pressures after increase in NTG gtt.  Son updated of transport to cath lab and is currently at bedside.  Will continue to monitor closely

## 2015-01-12 NOTE — Progress Notes (Signed)
  Echocardiogram 2D Echocardiogram has been performed.  Laura Herrera [name] 01/12/2015, 2:16 PM

## 2015-01-12 NOTE — Progress Notes (Signed)
Report given to [facility]. Bedside assessment completed. Dr. Herbie Baltimore updated on pt's condition at [phone]. Fluid bolus of 500 ml NS infusing. NS rate increased to 100 ml/hr. Patient pain unchanged. Heat pack applied to left shoulder at 0645 to attempt to help with pain. Morphine 2 mg IV given. See MAR. Attempt x 3 to call patient's son Susy Frizzle. Left message. Dr. Eden Emms at bedside at 0745 assessing the patient. Plan to return to cath lab. Cath lab team at bedside at 0800. Taken back to cath lab.

## 2015-01-12 NOTE — Progress Notes (Signed)
At 0548 additional morphine given, see MAR. Pain unchanged in patient's chest , lt arm and now radiating to lt shoulder.  Second 12 lead EKG obtained. Dr. Tresa Endo at bedside and assessed patient. NTG increased to 25 mcg. Continuing to assess.

## 2015-01-12 NOTE — Progress Notes (Signed)
Progress X-cover note  Called with chest pain at 05:05. She was CP free. Roughly 2/10. Trial of NTG gtt and morphine without much improvement. EKG with persistent subtle ST elevation inferiorly that never really resolved. I evaluated her and pain mildly worse 3/10. Vitals stable with soft BP. BP 105/45 mmHg  Pulse 63  Temp(Src) 99.2 F (37.3 C) (Oral)  Resp 14  Ht 5\' 7"  (1.702 m)  Wt 78 kg (171 lb 15.3 oz)  BMI 26.93 kg/m2  SpO2 99%HR regular rhythm. Lungs fairly clear. I discussed her cause with Dr. Herbie Baltimore who is back with another STEMI. We are starting tirofiban written for 12 hours and I discussed with incoming PA and our rounding team will see her first thing to reassess and consider re-look cath. I updated the primary RN and the patient.   Leeann Must, MD

## 2015-01-12 NOTE — Progress Notes (Signed)
    Subjective:  8/10 pain since 5:00 am no help with morphine and nitro Had felt better immediately after procedure.  ECG with persistent ST elevation  Objective:  Filed Vitals:   01/12/15 0630 01/12/15 0645 01/12/15 0700 01/12/15 0715  BP: 90/43 93/29 98/35  92/41  Pulse: 68 62 68 68  Temp:      TempSrc:      Resp: 12 8 15 11   Height:      Weight:      SpO2: 99% 100% 100% 100%    Intake/Output from previous day:  Intake/Output Summary (Last 24 hours) at 01/12/15 0738 Last data filed at 01/12/15 0300  Gross per 24 hour  Intake    511 ml  Output    700 ml  Net   -189 ml    Physical Exam: Concerned and in pain Obese white female  HEENT: normal Neck supple with no adenopathy JVP normal no bruits no thyromegaly Lungs clear with no wheezing and good diaphragmatic motion Heart:  S1/S2 no murmur, no rub, gallop or click PMI normal Abdomen: benighn, BS positve, no tenderness, no AAA no bruit.  No HSM or HJR Distal pulses intact with no bruits No edema Neuro non-focal Skin warm and dry No muscular weakness Right radial artery A    Lab Results: Basic Metabolic Panel:  Recent Labs  51/88/41 2258  NA 133*  K 3.7  CL 105  CO2 18*  GLUCOSE 104*  BUN 11  CREATININE 0.79  CALCIUM 8.6*  MG 1.7   Liver Function Tests:  Recent Labs  01/11/15 2258  AST 90*  ALT 35  ALKPHOS 75  BILITOT 0.8  PROT 6.2*  ALBUMIN 3.6   CBC:  Recent Labs  01/11/15 2258  WBC 14.0*  HGB 11.3*  HCT 33.8*  MCV 92.3  PLT 317   Cardiac Enzymes:  Recent Labs  01/11/15 2258  CKTOTAL 689*  CKMB 74.3*  TROPONINI 14.25*   Thyroid Function Tests:  Recent Labs  01/11/15 2258  TSH 3.035    Imaging: No results found.  Cardiac Studies:  ECG:  Evolving IMI with persistent inferior ST elevation Q waves 61F   Telemetry: NSR rate 72 no arrhythmia   Echo:   Medications:   . aspirin EC  81 mg Oral Daily  . atorvastatin  80 mg Oral q1800  . heparin  5,000 Units  Subcutaneous 3 times per day  . metoprolol tartrate  12.5 mg Oral BID  . sodium chloride  3 mL Intravenous Q12H  . ticagrelor  90 mg Oral BID     . sodium chloride 100 mL/hr at 01/12/15 0655  . nitroGLYCERIN 15 mcg/min (01/12/15 0450)  . tirofiban 0.15 mcg/kg/min (01/12/15 6606)    Assessment/Plan:  IMI:  Single vessel disease Difficult case with tough ostial lesion and multiple stents in mid vessel.  Unable to get rid of pain And persistent ST elevation Will need to go back to lab for relook  Have called lab to expedite.  Continue ticagrelor tirofiban  Restarted by Dr Herbie Baltimore at 6:30  BP soft due to nitro and getting 500 cc bolus.  Did not see LV gram on cath films No new Murmurs on exam.    Charlton Haws 01/12/2015, 7:38 AM

## 2015-01-12 NOTE — Care Management Note (Addendum)
Case Management Note  Patient Details  Name: Laura Herrera MRN: [id number] Date of Birth: 1950-10-15  Subjective/Objective:     Adm w mi               Action/Plan: lives at home   Expected Discharge Date:                  Expected Discharge Plan:  Home/Self Care  In-House Referral:     Discharge planning Services     Post Acute Care Choice:    Choice offered to:     DME Arranged:    DME Agency:     HH Arranged:    HH Agency:     Status of Service:     Medicare Important Message Given:    Date Medicare IM Given:    Medicare IM give by:    Date Additional Medicare IM Given:    Additional Medicare Important Message give by:     If discussed at Long Length of Stay Meetings, dates discussed:    Additional Comments: ur review done. Ur review done, gave pt 30day free and copay assist card for brilinta. Pt has bcbs ins.  Hanley Hays, RN 01/12/2015, 7:38 AM

## 2015-01-12 NOTE — Progress Notes (Signed)
Assess patient, reported that she has left arm numbness and tingling in left hand. Increase chest pain present. Morphine 2 mg IV given and IV NTG infusion started at 10 mg. Rest of patient assessment unchanged. 12 lead ekg obtained.  Dr. Leeann Must paged and made aware of findings. No additional orders received.

## 2015-01-12 NOTE — Progress Notes (Signed)
eLink Physician-Brief Progress Note Patient Name: Laura Herrera DOB: [birth year] MRN: [id number]   Date of Service  01/12/2015  HPI/Events of Note  57 F with PMH sign for HTN now s/p inferior STEMI with CC and 2 stents to OM and RCA.  Patient is currently HD stable with HR of 63 BP of 131/39 (67) and sats of 98% on RA.  eICU Interventions  Plan of care per primary cardiology service Continue to monitor via [facility]     Intervention Category Evaluation Type: New Patient Evaluation  [name] 01/12/2015, 12:12 AM

## 2015-01-13 LAB — HEMOGLOBIN A1C
Hgb A1c MFr Bld: 5.7 % — ABNORMAL HIGH (ref 4.8–5.6)
MEAN PLASMA GLUCOSE: 117 mg/dL

## 2015-01-13 MED ORDER — SUCCINYLCHOLINE CHLORIDE 20 MG/ML IJ SOLN
INTRAMUSCULAR | Status: AC
  Filled 2015-01-13: qty 1

## 2015-01-13 MED ORDER — LIDOCAINE HCL (CARDIAC) 20 MG/ML IV SOLN
INTRAVENOUS | Status: AC
  Filled 2015-01-13: qty 5

## 2015-01-13 MED ORDER — ROCURONIUM BROMIDE 50 MG/5ML IV SOLN
INTRAVENOUS | Status: AC
  Filled 2015-01-13: qty 2

## 2015-01-13 MED ORDER — ALPRAZOLAM 0.5 MG PO TABS
0.5000 mg | ORAL_TABLET | Freq: Two times a day (BID) | ORAL | Status: DC | PRN
  Administered 2015-01-13: 0.5 mg via ORAL
  Filled 2015-01-13: qty 1

## 2015-01-13 MED ORDER — ALPRAZOLAM 0.5 MG PO TABS
0.5000 mg | ORAL_TABLET | Freq: Every evening | ORAL | Status: DC | PRN
  Administered 2015-01-13: 0.5 mg via ORAL
  Filled 2015-01-13: qty 1

## 2015-01-13 MED ORDER — METOCLOPRAMIDE HCL 5 MG/ML IJ SOLN
5.0000 mg | Freq: Once | INTRAMUSCULAR | Status: AC
  Administered 2015-01-13: 5 mg via INTRAVENOUS
  Filled 2015-01-13: qty 1

## 2015-01-13 MED ORDER — ETOMIDATE 2 MG/ML IV SOLN
INTRAVENOUS | Status: AC
  Filled 2015-01-13: qty 20

## 2015-01-13 MED FILL — Heparin Sodium (Porcine) 2 Unit/ML in Sodium Chloride 0.9%: INTRAMUSCULAR | Qty: 1000 | Status: AC

## 2015-01-13 NOTE — Progress Notes (Signed)
Patient ID: Laura Herrera, female   DOB: [birth year], 64 y.o.   MRN: [id number]    Subjective:  Looks great this am No angina   Objective:  Filed Vitals:   01/13/15 0542 01/13/15 0600 01/13/15 0700 01/13/15 0704  BP: 111/27 86/29 76/69  88/46  Pulse: 61 63 62 64  Temp:    98.5 F (36.9 C)  TempSrc:    Oral  Resp: 0 Height:      Weight:      SpO2: 96% 97% 94% 97%    Intake/Output from previous day:  Intake/Output Summary (Last 24 hours) at 01/13/15 0751 Last data filed at 01/13/15 0700  Gross per 24 hour  Intake 3165.55 ml  Output   2125 ml  Net 1040.55 ml    Physical Exam: Affect normal  Obese white female  HEENT: normal Neck supple with no adenopathy JVP normal no bruits no thyromegaly Lungs clear with no wheezing and good diaphragmatic motion Heart:  S1/S2 no murmur, no rub, gallop or click PMI normal Abdomen: benighn, BS positve, no tenderness, no AAA  RFA A no hematoma  no bruit.  No HSM or HJR Distal pulses intact with no bruits No edema Neuro non-focal Skin warm and dry No muscular weakness Right radial artery A    Lab Results: Basic Metabolic Panel:  Recent Labs  40/98/11 2258 01/12/15 0847  NA 133* 138  K 3.7 3.6  CL 105 106  CO2 18*  --   GLUCOSE 104* 110*  BUN 11 9  CREATININE 0.79 0.70  CALCIUM 8.6*  --   MG 1.7  --    Liver Function Tests:  Recent Labs  01/11/15 2258  AST 90*  ALT 35  ALKPHOS 75  BILITOT 0.8  PROT 6.2*  ALBUMIN 3.6   CBC:  Recent Labs  01/11/15 2258 01/12/15 0847  WBC 14.0*  --   HGB 11.3* 11.2*  HCT 33.8* 33.0*  MCV 92.3  --   PLT 317  --    Cardiac Enzymes:  Recent Labs  01/11/15 2258 01/12/15 0650 01/12/15 1215 01/12/15 1720  CKTOTAL 689*  --   --   --   CKMB 74.3*  --   --   --   TROPONINI 14.25* 17.45* 10.08* 10.44*   Thyroid Function Tests:  Recent Labs  01/11/15 2258  TSH 3.035    Imaging: No results found.  Cardiac Studies:  ECG:  Evolving IMI with  persistent inferior ST elevation Q waves 92F   Telemetry: NSR rate 60's no arrhythmia  01/13/2015   Echo: EF 60-65% moderate AR  01/12/15 reviewed   Medications:   . aspirin EC  81 mg Oral Daily  . atorvastatin  80 mg Oral q1800  . heparin  5,000 Units Subcutaneous 3 times per day  . metoprolol tartrate  12.5 mg Oral BID  . sodium chloride  3 mL Intravenous Q12H  . ticagrelor  90 mg Oral BID     . sodium chloride 100 mL/hr (01/12/15 0800)  . sodium chloride 100 mL/hr at 01/13/15 0700  . nitroGLYCERIN 10 mcg/min (01/12/15 2209)  . tirofiban 0.15 mcg/kg/min (01/13/15 0700)    Assessment/Plan:  IMI:  Single vessel disease Difficult case with tough ostial lesion and multiple stents in mid vessel.  Relook With patent vessel but proximal filling defect  Unable to engage ostium due to stent protrusion.  Discussed with Dr Katrinka Blazing and if she has recurrent issues may need single vessel CABG  Continue ASA beta blocker and ticagrelor Keep in unit today  Possible d/c in 48 hrs   Charlton Haws 01/13/2015, 7:51 AM

## 2015-01-13 NOTE — Progress Notes (Signed)
1140 Came to see pt. Pt resting after receiving meds for discomfort. Will follow up late if time permits. Will continue to follow. Luetta Nutting RN BSN 01/13/2015 11:40 AM

## 2015-01-13 NOTE — Progress Notes (Signed)
Pt c/o new left shoulder pain rating at a 3/10 pain.  STAT EKG obtained and was WNL. MD notified and came to bedside. Pain increased to a 4/10.  MD ordered to given PRN morphine. NO new orders received at this time.  Morphine, zofran and xanax given.will continue to monitor closely and update as needed.

## 2015-01-13 NOTE — Progress Notes (Signed)
CARDIAC REHAB PHASE I   Went to see if pt ready to ambulate. Pt resting in bed with lights off in room, family at bedside. Pt states she continues to have some pain in her chest. Pt also states she is "woozy" from taking xanax and morphine. Pt declined ambulation at this time. Will follow-up tomorrow.   Joylene Grapes, RN, BSN 01/13/2015 1:56 PM

## 2015-01-14 MED ORDER — ALPRAZOLAM 0.5 MG PO TABS
0.5000 mg | ORAL_TABLET | Freq: Three times a day (TID) | ORAL | Status: DC | PRN
  Administered 2015-01-14 – 2015-01-17 (×7): 0.5 mg via ORAL
  Filled 2015-01-14 (×7): qty 1

## 2015-01-14 NOTE — Progress Notes (Signed)
Pt feeling nauseated at this time, will transfer once pt feels better.

## 2015-01-14 NOTE — Progress Notes (Signed)
Report called to 3W. Will transfer shortly.

## 2015-01-14 NOTE — Progress Notes (Signed)
Attempted to call report to 3W.  RN unable to take report at this time.  Told to call back in 15 min.  Will try back.

## 2015-01-14 NOTE — Progress Notes (Signed)
Patient ID: LOVIA FERRINI, female   DOB: 10/15/1950, 64 y.o.   MRN: [id number]    Subjective:  Some abdominal and right shoulder pain   Objective:  Filed Vitals:   01/14/15 0500 01/14/15 0600 01/14/15 0700 01/14/15 0723  BP: 102/44 100/41 114/56   Pulse:      Temp:      TempSrc:      Resp: 0 0 18   Height:      Weight: 81.6 kg (179 lb 14.3 oz)     SpO2:    93%    Intake/Output from previous day:  Intake/Output Summary (Last 24 hours) at 01/14/15 1856 Last data filed at 01/14/15 0700  Gross per 24 hour  Intake 3113.46 ml  Output   1950 ml  Net 1163.46 ml    Physical Exam: Affect normal  Obese white female  HEENT: normal Neck supple with no adenopathy JVP normal no bruits no thyromegaly Lungs clear with no wheezing and good diaphragmatic motion Heart:  S1/S2 no murmur, no rub, gallop or click PMI normal Abdomen: benighn, BS positve, no tenderness, no AAA  RFA A no hematoma  no bruit.  No HSM or HJR Distal pulses intact with no bruits No edema Neuro non-focal Skin warm and dry No muscular weakness Right radial artery A    Lab Results: Basic Metabolic Panel:  Recent Labs  31/49/70 2258 01/12/15 0847  NA 133* 138  K 3.7 3.6  CL 105 106  CO2 18*  --   GLUCOSE 104* 110*  BUN 11 9  CREATININE 0.79 0.70  CALCIUM 8.6*  --   MG 1.7  --    Liver Function Tests:  Recent Labs  01/11/15 2258  AST 90*  ALT 35  ALKPHOS 75  BILITOT 0.8  PROT 6.2*  ALBUMIN 3.6   CBC:  Recent Labs  01/11/15 2258 01/12/15 0847  WBC 14.0*  --   HGB 11.3* 11.2*  HCT 33.8* 33.0*  MCV 92.3  --   PLT 317  --    Cardiac Enzymes:  Recent Labs  01/11/15 2258 01/12/15 0650 01/12/15 1215 01/12/15 1720  CKTOTAL 689*  --   --   --   CKMB 74.3*  --   --   --   TROPONINI 14.25* 17.45* 10.08* 10.44*   Thyroid Function Tests:  Recent Labs  01/11/15 2258  TSH 3.035    Imaging: No results found.  Cardiac Studies:  ECG:  Evolving IMI with persistent inferior  ST elevation Q waves 1F   Telemetry: NSR rate 60's no arrhythmia  01/14/2015   Echo: EF 60-65% moderate AR  01/12/15 reviewed   Medications:   . aspirin EC  81 mg Oral Daily  . atorvastatin  80 mg Oral q1800  . heparin  5,000 Units Subcutaneous 3 times per day  . metoprolol tartrate  12.5 mg Oral BID  . sodium chloride  3 mL Intravenous Q12H  . ticagrelor  90 mg Oral BID     . sodium chloride 100 mL/hr at 01/14/15 0700    Assessment/Plan:  IMI:  Single vessel disease Difficult case with tough ostial lesion and multiple stents in mid vessel.  Relook With patent vessel but proximal filling defect  Unable to engage ostium due to stent protrusion.  Discussed with Dr Katrinka Blazing and if she has recurrent issues may need single vessel CABG  Continue ASA beta blocker and ticagrelor Transfer to telemetry   Advance diet abdomen is soft with bowel sounds.  Some anxiety written for xanax.     Charlton Haws 01/14/2015, 8:38 AM

## 2015-01-14 NOTE — Progress Notes (Signed)
CARDIAC REHAB PHASE I   PRE:  Rate/Rhythm: 75 SR  BP:  Sitting: 114/46        SaO2: 95 RA  MODE:  Ambulation: 150 ft   POST:  Rate/Rhythm: 80 SR  BP:  Sitting: 136/54         SaO2: 95 RA  Pt states she has been nauseated today and yesterday, pt currently sitting in recliner eating broth and [place] ice pop.  Pt agreeable to ambulate. Pt ambulated 150 ft on RA, hand held assist, steady gait, tolerated fair, standing rest x1.  Pt c/o DOE, requested to return to room. Began MI/stent education. Gave pt MI book. Reviewed risk factors, anti-platelet therapy, stent card, activity restrictions, ntg, gave pt heart healthy diet sheet, sodium recommendations, and phase 2 cardiac rehab brochure. Pt verbalized understanding.  Pt states she has been under a lot of stress the past 8 years- her husband died, she lost her job and her parents died.  Pt thinks her stress has not helped with her heart problems. Pt tearful discussing husband's death and loss of her job.  Pt states she has a good support system and has since made changes to her lifestyle eating a "healthy diet" and "doing piliates 3x/week." Pt very responsive to education. Pt to recliner after walk, call bell within reach. Will follow up tomorrow.     [id number]  Joylene Grapes, RN, BSN 01/14/2015 2:58 PM

## 2015-01-15 LAB — TROPONIN I
TROPONIN I: 3.84 ng/mL — AB (ref <0.031)
Troponin I: 3.2 ng/mL (ref <0.031)

## 2015-01-15 MED ORDER — ISOSORBIDE MONONITRATE ER 30 MG PO TB24
15.0000 mg | ORAL_TABLET | Freq: Every day | ORAL | Status: DC
  Administered 2015-01-15 – 2015-01-17 (×3): 15 mg via ORAL
  Filled 2015-01-15 (×3): qty 1

## 2015-01-15 NOTE — Progress Notes (Signed)
Dr Allena Katz at bedside evaluating pt. EKG provided. Lab made aware of stat Troponin to be drawn. Total of 3 NTGs administered. VSS. CP a 3/10 at the moment.

## 2015-01-15 NOTE — Progress Notes (Addendum)
Pt had c/o of chest pain after she got out of bed and used the bathroom. She had scored her pain at 3-4/10 on pain scale and slightly SOB. Pain medication given . Not currently SOB at this time.Pain was radiating to her back , arm  and neck area.

## 2015-01-15 NOTE — Progress Notes (Signed)
Dr Allena Katz paged. Informed of pt c/o MSCP 4/10 radiating to R shoulder. I NTG 1/150 SL administered per Dr. Allena Katz. Ekg being done. VS being monitored. Nasal O2 applied at 2L.

## 2015-01-15 NOTE — Progress Notes (Signed)
CARDIAC REHAB PHASE I   PRE:  Rate/Rhythm: 66 SR  BP:  Sitting: 120/45        SaO2: 94 RA  MODE:  Ambulation: 300 ft   POST:  Rate/Rhythm: 83 sR  BP:  Sitting: 102/56         SaO2: 97 RA  Pt c/o chest/shoulder pain after walking to bathroom this morning. Pt in bed resting on 2L O2, still reports 3/10 chest pain in her mid/left chest, described as squeezing, worse at the end of a deep breath.  Pt agreeable to walk.  Pt up to bathroom first, c/o 4/10 chest pain with initial activity.  Pt states she would "like to walk a little farther."  Pt ambulated 300 ft on RA, very slow pace, occasionally mildly unsteady gait, tolerated fair. Pt c/o DOE, states her pain "leveled off to a 3/10" during ambulation. Pt requesting oxygen upon returning to room, states she feels "short of breath." Pt sats 97% on RA, pt placed on 2L O2 nasal cannula per request. Pt to bed after walk per request, call bell within reach. RN notified of continued chest pain and shortness of breath.  Will follow-up tomorrow.  [phone]  Joylene Grapes, RN, BSN 01/15/2015 3:22 PM

## 2015-01-15 NOTE — Progress Notes (Signed)
CARDIAC REHAB PHASE I   Went to see pt to ambulate. Pt c/o SOB and arm/neck/back pain. RN performing EKG at bedside. PA states no plan at this time to discharge pt today. Will follow-up with pt this afternoon.     Joylene Grapes, RN, BSN 01/15/2015 8:44 AM

## 2015-01-15 NOTE — Progress Notes (Signed)
Dr. Allena Katz made aware of EKG results and that 2nd NTG 1/150 administered. B/P holding.

## 2015-01-15 NOTE — Progress Notes (Signed)
Patient Profile: 64 year old female with PMH of HTN, HLD, and former tobacco use presenting with inferior STEMI.   Subjective: Experienced moderate to severe chest pain this morning after ambulating to and from the bathroom. The pain occurred after she laid down in bed, not during activity. The pain is somewhat similar to her angina pain but worse with deep inspiration and no radiation down her arm. It does radiated to her back and posterior neck. It improved with PRN morphine. It did not resolve with leaning forward. There was very mild dyspnea. No diaphoresis, nausea, vomiting, or palpitations.   Objective: Vital signs in last 24 hours: Temp:  [98.5 F (36.9 C)-98.7 F (37.1 C)] 98.7 F (37.1 C) (06/10 0500) Pulse Rate:  [63-75] 63 (06/10 0500) Resp:  [11-18] 18 (06/09 1945) BP: (110-147)/(42-54) 110/47 mmHg (06/10 0500) SpO2:  [96 %-98 %] 96 % (06/10 0500) Weight:  [81.602 kg (179 lb 14.4 oz)] 81.602 kg (179 lb 14.4 oz) (06/10 0500) Last BM Date: 01/11/15  Intake/Output from previous day: 06/09 0701 - 06/10 0700 In: 340 [P.O.:240; I.V.:100] Out: -  Intake/Output this shift:    Medications Current Facility-Administered Medications  Medication Dose Route Frequency Provider Last Rate Last Dose  . 0.9 %  sodium chloride infusion  250 mL Intravenous PRN Lyn Records, MD      . acetaminophen (TYLENOL) tablet 650 mg  650 mg Oral Q4H PRN Leeann Must, MD   650 mg at 01/12/15 0311  . ALPRAZolam Prudy Feeler) tablet 0.5 mg  0.5 mg Oral TID PRN Wendall Stade, MD   0.5 mg at 01/14/15 1801  . aspirin EC tablet 81 mg  81 mg Oral Daily Leeann Must, MD   81 mg at 01/14/15 1038  . atorvastatin (LIPITOR) tablet 80 mg  80 mg Oral q1800 Leeann Must, MD   80 mg at 01/14/15 1759  . heparin injection 5,000 Units  5,000 Units Subcutaneous 3 times per day Leeann Must, MD   5,000 Units at 01/15/15 0622  . HYDROcodone-acetaminophen (NORCO/VICODIN) 5-325 MG per tablet 1-2 tablet  1-2 tablet Oral Q4H PRN  Sulaiman Alonna Minium, MD   1 tablet at 01/14/15 2355  . metoprolol tartrate (LOPRESSOR) tablet 12.5 mg  12.5 mg Oral BID Leeann Must, MD   12.5 mg at 01/14/15 2121  . morphine 2 MG/ML injection 2 mg  2 mg Intravenous Q1H PRN Marykay Lex, MD   2 mg at 01/15/15 0748  . nitroGLYCERIN (NITROSTAT) SL tablet 0.4 mg  0.4 mg Sublingual Q5 Min x 3 PRN Leeann Must, MD      . ondansetron [facility]) injection 4 mg  4 mg Intravenous Q6H PRN Leeann Must, MD   4 mg at 01/15/15 0733  . sodium chloride 0.9 % injection 3 mL  3 mL Intravenous Q12H Lyn Records, MD   3 mL at 01/14/15 2330  . sodium chloride 0.9 % injection 3 mL  3 mL Intravenous PRN Lyn Records, MD      . ticagrelor [facility]) tablet 90 mg  90 mg Oral BID Leeann Must, MD   90 mg at 01/14/15 2121  . zolpidem (AMBIEN) tablet 5 mg  5 mg Oral QHS PRN Leeann Must, MD        PE: General appearance: alert, cooperative and mild distress Neck: JVD - 6 cm above sternal notch, no adenopathy, no carotid bruit, supple, symmetrical, trachea midline and thyroid not enlarged, symmetric, no tenderness/mass/nodules Lungs: diminished breath sounds LLL  and RLL Heart: regular rate and rhythm, S1, S2 normal, no murmur, click, rub or gallop Abdomen: soft, non-tender; bowel sounds normal; no masses,  no organomegaly Extremities: extremities normal, atraumatic, no cyanosis or edema Pulses: 2+ and symmetric Skin: Skin color, texture, turgor normal. No rashes or lesions Neurologic: Grossly normal  Lab Results:   Recent Labs  01/12/15 0847  HGB 11.2*  HCT 33.0*   BMET  Recent Labs  01/12/15 0847  NA 138  K 3.6  CL 106  GLUCOSE 110*  BUN 9  CREATININE 0.70   Cardiac Enzymes:  14.25 > 17.45 > 10.08 > 10.44.   Studies/Results: 01/12/15 TTE: - Left ventricle: The cavity size was normal. Wall thickness was increased in a pattern of mild LVH. Systolic function was normal. The estimated ejection fraction was in the range of 60% to 65%. Wall  motion was normal; there were no regional wall motion abnormalities. Left ventricular diastolic function parameters were normal. - Aortic valve: There was moderate regurgitation. - Mitral valve: Calcified annulus. - Pulmonary arteries: PA peak pressure: 37 mm Hg (S).   Assessment/Plan Principal Problem:   ST elevation myocardial infarction (STEMI) of inferior wall Active Problems:   Essential hypertension   Dyslipidemia   Coronary artery disease involving native coronary artery of native heart with unstable angina pectoris   1. Inferior STEMI s/p 2 DES to RCA: Severe single-vessel disease involving the ostium and mid RCA. Unable to engage ostium due to stent protrusion.   - She had chest pain this morning after ambulation which improved with morphine. No ECG changes. Troponin ordered x1.   - Will plan to ambulate her again later today and see if this chest pain is reproducible.   -May require single vessel CABG if she remains symptomatic  -Continue ASA, BB, atorvastatin, and ticagrelor.     2. Essential HTN: well controlled. Adding low-dose ACEI.   3. HLD: continue high intensity statin.     LOS: 4 days    Azalee Course, PA-C 01/15/2015 8:45 AM  Patient examined  SEM not classic rub.  She is teary eyed and having a hard time coming to grips with diagnosis and lack of perfect result.  Troponins coming down and ECG no acute ST elevation I think she is having more post MI pain than new angina Add low dose nitrates  To medical Rx as BP allows   Charlton Haws

## 2015-01-16 NOTE — Progress Notes (Signed)
CARDIAC REHAB PHASE I   PRE:  Rate/Rhythm: 67 SR    BP: sitting 92/50    SaO2:   MODE:  Ambulation: 450 ft   POST:  Rate/Rhythm: 83 SR    BP: sitting 110/54     SaO2:   Pt able to walk slowly. Sts she is not having CP today, sts she feels a slight pressure when she takes a deep breath. This was only minimally worse after walking. BP and HR stable. Discussed increasing ex/walking at home. Discussed NTG. Pt plans to attend [facility]. Encouraged her to walk atleast x2 more today. [id number]   Laura Herrera CES, ACSM 01/16/2015 11:51 AM

## 2015-01-16 NOTE — Progress Notes (Addendum)
Patient ID: Laura Herrera, female   DOB: 1950/10/27, 64 y.o.   MRN: [id number]    Patient Profile: 64 year old female with PMH of HTN, HLD, and former tobacco use presenting with inferior STEMI.   Subjective: No pain this am  Scared to go home   Objective: Vital signs in last 24 hours: Temp:  [98.2 F (36.8 C)-99.1 F (37.3 C)] 98.2 F (36.8 C) (06/11 2423) Pulse Rate:  [64-77] 67 (06/11 0926) Resp:  [17-18] 17 (06/11 0050) BP: (101-152)/(42-55) 101/42 mmHg (06/11 0926) SpO2:  [93 %-100 %] 100 % (06/11 5361) Weight:  [83.19 kg (183 lb 6.4 oz)] 83.19 kg (183 lb 6.4 oz) (06/11 0608) Last BM Date: 01/11/15  Intake/Output from previous day: 06/10 0701 - 06/11 0700 In: 240 [P.O.:240] Out: -  Intake/Output this shift: Total I/O In: 240 [P.O.:240] Out: -   Medications Current Facility-Administered Medications  Medication Dose Route Frequency Provider Last Rate Last Dose  . 0.9 %  sodium chloride infusion  250 mL Intravenous PRN Lyn Records, MD      . acetaminophen (TYLENOL) tablet 650 mg  650 mg Oral Q4H PRN Leeann Must, MD   650 mg at 01/12/15 0311  . ALPRAZolam Prudy Feeler) tablet 0.5 mg  0.5 mg Oral TID PRN Wendall Stade, MD   0.5 mg at 01/16/15 0741  . aspirin EC tablet 81 mg  81 mg Oral Daily Leeann Must, MD   81 mg at 01/16/15 0926  . atorvastatin (LIPITOR) tablet 80 mg  80 mg Oral q1800 Leeann Must, MD   80 mg at 01/15/15 1704  . heparin injection 5,000 Units  5,000 Units Subcutaneous 3 times per day Leeann Must, MD   5,000 Units at 01/16/15 [id number]  . HYDROcodone-acetaminophen (NORCO/VICODIN) 5-325 MG per tablet 1-2 tablet  1-2 tablet Oral Q4H PRN Sulaiman Alonna Minium, MD   1 tablet at 01/16/15 0107  . isosorbide mononitrate (IMDUR) 24 hr tablet 15 mg  15 mg Oral Daily Wendall Stade, MD   15 mg at 01/16/15 0932  . metoprolol tartrate (LOPRESSOR) tablet 12.5 mg  12.5 mg Oral BID Leeann Must, MD   12.5 mg at 01/16/15 5400  . morphine 2 MG/ML injection 2 mg  2 mg Intravenous  Q1H PRN Marykay Lex, MD   2 mg at 01/15/15 0748  . nitroGLYCERIN (NITROSTAT) SL tablet 0.4 mg  0.4 mg Sublingual Q5 Min x 3 PRN Leeann Must, MD   0.4 mg at 01/15/15 2145  . ondansetron (ZOFRAN) injection 4 mg  4 mg Intravenous Q6H PRN Leeann Must, MD   4 mg at 01/16/15 0107  . sodium chloride 0.9 % injection 3 mL  3 mL Intravenous Q12H Lyn Records, MD   3 mL at 01/15/15 1000  . sodium chloride 0.9 % injection 3 mL  3 mL Intravenous PRN Lyn Records, MD      . ticagrelor [facility]) tablet 90 mg  90 mg Oral BID Leeann Must, MD   90 mg at 01/16/15 8676  . zolpidem (AMBIEN) tablet 5 mg  5 mg Oral QHS PRN Leeann Must, MD        PE: General appearance: alert, cooperative and mild distress Neck: JVD - 6 cm above sternal notch, no adenopathy, no carotid bruit, supple, symmetrical, trachea midline and thyroid not enlarged, symmetric, no tenderness/mass/nodules Lungs: diminished breath sounds LLL and RLL Heart: regular rate and rhythm, S1, S2 normal, no murmur, click, rub or gallop Abdomen: soft, non-tender;  bowel sounds normal; no masses,  no organomegaly Extremities: extremities normal, atraumatic, no cyanosis or edema Pulses: 2+ and symmetric Skin: Skin color, texture, turgor normal. No rashes or lesions Neurologic: Grossly normal  Lab Results:  No results for input(s): WBC, HGB, HCT, PLT in the last 72 hours. BMET No results for input(s): NA, K, CL, CO2, GLUCOSE, BUN, CREATININE, CALCIUM in the last 72 hours. Cardiac Enzymes:  14.25 > 17.45 > 10.08 > 10.44. -> 3.8 ->3.2   Studies/Results: 01/12/15 TTE: - Left ventricle: The cavity size was normal. Wall thickness was increased in a pattern of mild LVH. Systolic function was normal. The estimated ejection fraction was in the range of 60% to 65%. Wall motion was normal; there were no regional wall motion abnormalities. Left ventricular diastolic function parameters were normal. - Aortic valve: There was moderate  regurgitation. - Mitral valve: Calcified annulus. - Pulmonary arteries: PA peak pressure: 37 mm Hg (S).   Assessment/Plan Principal Problem:   ST elevation myocardial infarction (STEMI) of inferior wall Active Problems:   Essential hypertension   Dyslipidemia   Coronary artery disease involving native coronary artery of native heart with unstable angina pectoris   1. Inferior STEMI s/p 2 DES to RCA: Severe single-vessel disease involving the ostium and mid RCA. Unable to engage ostium due to stent protrusion.   - DC in am  with brother who is an MD no acute ECG changes with pain and troponins consistantly trending down   -Continue ASA, BB, atorvastatin, and ticagrelor.   And imdur  2. Essential HTN: well controlled.  3. HLD: continue high intensity statin.     LOS: 5 days    Charlton Haws

## 2015-01-17 ENCOUNTER — Other Ambulatory Visit: Payer: Self-pay | Admitting: Nurse Practitioner

## 2015-01-17 ENCOUNTER — Encounter (HOSPITAL_COMMUNITY): Payer: Self-pay | Admitting: Nurse Practitioner

## 2015-01-17 DIAGNOSIS — R0989 Other specified symptoms and signs involving the circulatory and respiratory systems: Secondary | ICD-10-CM

## 2015-01-17 MED ORDER — NITROGLYCERIN 0.4 MG SL SUBL: 0.4000 mg | SUBLINGUAL_TABLET | SUBLINGUAL | Status: DC | PRN

## 2015-01-17 MED ORDER — METOPROLOL TARTRATE 25 MG PO TABS: 12.5000 mg | ORAL_TABLET | Freq: Two times a day (BID) | ORAL | Status: DC

## 2015-01-17 MED ORDER — ISOSORBIDE MONONITRATE ER 30 MG PO TB24: 15.0000 mg | ORAL_TABLET | Freq: Every day | ORAL | Status: DC

## 2015-01-17 MED ORDER — TICAGRELOR 90 MG PO TABS: 90.0000 mg | ORAL_TABLET | Freq: Two times a day (BID) | ORAL | Status: DC

## 2015-01-17 MED ORDER — ASPIRIN 81 MG PO TBEC: 81.0000 mg | DELAYED_RELEASE_TABLET | Freq: Every day | ORAL | Status: DC

## 2015-01-17 MED ORDER — ATORVASTATIN CALCIUM 80 MG PO TABS: 80.0000 mg | ORAL_TABLET | Freq: Every day | ORAL | Status: DC

## 2015-01-17 NOTE — Discharge Instructions (Signed)
**PLEASE REMEMBER TO BRING ALL OF YOUR MEDICATIONS TO EACH OF YOUR FOLLOW-UP OFFICE VISITS.  NO HEAVY LIFTING X 4 WEEKS. NO SEXUAL ACTIVITY X 4 WEEKS. NO DRIVING X 2 WEEKS. NO SOAKING BATHS, HOT TUBS, POOLS, ETC., X 7 DAYS.  Radial Site Care Refer to this sheet in the next few weeks. These instructions provide you with information on caring for yourself after your procedure. Your caregiver may also give you more specific instructions. Your treatment has been planned according to current medical practices, but problems sometimes occur. Call your caregiver if you have any problems or questions after your procedure. HOME CARE INSTRUCTIONS  You may shower the day after the procedure.Remove the bandage (dressing) and gently wash the site with plain soap and water.Gently pat the site dry.   Do not apply powder or lotion to the site.   Do not submerge the affected site in water for 3 to 5 days.   Inspect the site at least twice daily.   Do not flex or bend the affected arm for 24 hours.   No lifting over 5 pounds (2.3 kg) for 5 days after your procedure.   Do not drive home if you are discharged the same day of the procedure. Have someone else drive you.  What to expect:  Any bruising will usually fade within 1 to 2 weeks.   Blood that collects in the tissue (hematoma) may be painful to the touch. It should usually decrease in size and tenderness within 1 to 2 weeks.  SEEK IMMEDIATE MEDICAL CARE IF:  You have unusual pain at the radial site.   You have redness, warmth, swelling, or pain at the radial site.   You have drainage (other than a small amount of blood on the dressing).   You have chills.   You have a fever or persistent symptoms for more than 72 hours.   You have a fever and your symptoms suddenly get worse.   Your arm becomes pale, cool, tingly, or numb.   You have heavy bleeding from the site. Hold pressure on the site.  _____________  Groin Site Care Refer to  this sheet in the next few weeks. These instructions provide you with information on caring for yourself after your procedure. Your caregiver may also give you more specific instructions. Your treatment has been planned according to current medical practices, but problems sometimes occur. Call your caregiver if you have any problems or questions after your procedure. HOME CARE INSTRUCTIONS  You may shower 24 hours after the procedure. Remove the bandage (dressing) and gently wash the site with plain soap and water. Gently pat the site dry.   Do not apply powder or lotion to the site.   Do not sit in a bathtub, swimming pool, or whirlpool for 5 to 7 days.   No bending, squatting, or lifting anything over 10 pounds (4.5 kg) as directed by your caregiver.   Inspect the site at least twice daily.   Do not drive home if you are discharged the same day of the procedure. Have someone else drive you.   What to expect:  Any bruising will usually fade within 1 to 2 weeks.   Blood that collects in the tissue (hematoma) may be painful to the touch. It should usually decrease in size and tenderness within 1 to 2 weeks.  SEEK IMMEDIATE MEDICAL CARE IF:  You have unusual pain at the groin site or down the affected leg.   You have redness,  warmth, swelling, or pain at the groin site.   You have drainage (other than a small amount of blood on the dressing).   You have chills.   You have a fever or persistent symptoms for more than 72 hours.   You have a fever and your symptoms suddenly get worse.   Your leg becomes pale, cool, tingly, or numb.  You have heavy bleeding from the site. Hold pressure on the site. Marland Kitchen

## 2015-01-17 NOTE — Progress Notes (Signed)
Patient ID: Laura Herrera, female   DOB: 07/31/1951, 64 y.o.   MRN: [id number]    Patient Profile: 64 year old female with PMH of HTN, HLD, and former tobacco use presenting with inferior STEMI.   Subjective: No pain this am  Scared to go home   Objective: Vital signs in last 24 hours: Temp:  [98.7 F (37.1 C)-99.3 F (37.4 C)] 99.3 F (37.4 C) (06/12 0608) Pulse Rate:  [67-86] 71 (06/12 0608) Resp:  [18-20] 18 (06/12 0037) BP: (101-119)/(42-54) 113/45 mmHg (06/12 0608) SpO2:  [93 %-96 %] 95 % (06/12 0608) Weight:  [82.645 kg (182 lb 3.2 oz)] 82.645 kg (182 lb 3.2 oz) (06/12 0300) Last BM Date: 01/11/15  Intake/Output from previous day: 06/11 0701 - 06/12 0700 In: 720 [P.O.:720] Out: -  Intake/Output this shift:    Medications Current Facility-Administered Medications  Medication Dose Route Frequency Provider Last Rate Last Dose  . 0.9 %  sodium chloride infusion  250 mL Intravenous PRN Lyn Records, MD      . acetaminophen (TYLENOL) tablet 650 mg  650 mg Oral Q4H PRN Leeann Must, MD   650 mg at 01/12/15 0311  . ALPRAZolam Prudy Feeler) tablet 0.5 mg  0.5 mg Oral TID PRN Wendall Stade, MD   0.5 mg at 01/17/15 0223  . aspirin EC tablet 81 mg  81 mg Oral Daily Leeann Must, MD   81 mg at 01/16/15 0926  . atorvastatin (LIPITOR) tablet 80 mg  80 mg Oral q1800 Leeann Must, MD   80 mg at 01/16/15 1712  . heparin injection 5,000 Units  5,000 Units Subcutaneous 3 times per day Leeann Must, MD   5,000 Units at 01/17/15 0718  . HYDROcodone-acetaminophen (NORCO/VICODIN) 5-325 MG per tablet 1-2 tablet  1-2 tablet Oral Q4H PRN Sulaiman Alonna Minium, MD   1 tablet at 01/16/15 0107  . isosorbide mononitrate (IMDUR) 24 hr tablet 15 mg  15 mg Oral Daily Wendall Stade, MD   15 mg at 01/16/15 0932  . metoprolol tartrate (LOPRESSOR) tablet 12.5 mg  12.5 mg Oral BID Leeann Must, MD   12.5 mg at 01/16/15 2318  . morphine 2 MG/ML injection 2 mg  2 mg Intravenous Q1H PRN Marykay Lex, MD   2 mg at  01/15/15 0748  . nitroGLYCERIN (NITROSTAT) SL tablet 0.4 mg  0.4 mg Sublingual Q5 Min x 3 PRN Leeann Must, MD   0.4 mg at 01/15/15 2145  . ondansetron (ZOFRAN) injection 4 mg  4 mg Intravenous Q6H PRN Leeann Must, MD   4 mg at 01/16/15 0107  . sodium chloride 0.9 % injection 3 mL  3 mL Intravenous Q12H Lyn Records, MD   3 mL at 01/16/15 1000  . sodium chloride 0.9 % injection 3 mL  3 mL Intravenous PRN Lyn Records, MD      . ticagrelor [facility]) tablet 90 mg  90 mg Oral BID Leeann Must, MD   90 mg at 01/16/15 2317  . zolpidem (AMBIEN) tablet 5 mg  5 mg Oral QHS PRN Leeann Must, MD        PE: General appearance: alert, cooperative and mild distress Neck: JVD - 6 cm above sternal notch, no adenopathy, no carotid bruit, supple, symmetrical, trachea midline and thyroid not enlarged, symmetric, no tenderness/mass/nodules Lungs: diminished breath sounds LLL and RLL Heart: regular rate and rhythm, S1, S2 normal, SEM murmur, click, rub or gallop Abdomen: soft, non-tender; bowel sounds normal; no masses,  no organomegaly Extremities: extremities normal, atraumatic, no cyanosis or edema Pulses: 2+ and symmetric bilateral bruits  Skin: Skin color, texture, turgor normal. No rashes or lesions Neurologic: Grossly normal  Cardiac Enzymes:  14.25 > 17.45 > 10.08 > 10.44. -> 3.8 ->3.2   Studies/Results: 01/12/15 TTE: - Left ventricle: The cavity size was normal. Wall thickness was increased in a pattern of mild LVH. Systolic function was normal. The estimated ejection fraction was in the range of 60% to 65%. Wall motion was normal; there were no regional wall motion abnormalities. Left ventricular diastolic function parameters were normal. - Aortic valve: There was moderate regurgitation. - Mitral valve: Calcified annulus. - Pulmonary arteries: PA peak pressure: 37 mm Hg (S).   Assessment/Plan Principal Problem:   ST elevation myocardial infarction (STEMI) of inferior wall Active  Problems:   Essential hypertension   Dyslipidemia   Coronary artery disease involving native coronary artery of native heart with unstable angina pectoris   1. Inferior STEMI s/p 2 DES to RCA: Severe single-vessel disease involving the ostium and mid RCA. Unable to engage ostium due to stent protrusion.   - DC in am  with brother who is an MD no acute ECG changes with pain and troponins consistantly trending down   -Continue ASA, BB, atorvastatin, and ticagrelor.   And imdur  2. Essential HTN: well controlled.  3. HLD: continue high intensity statin.    4. Bruit:  F/u outpatient duplex    LOS: 6 days    Charlton Haws

## 2015-01-17 NOTE — Progress Notes (Signed)
Pt prescribed Xanax 0.5 mg TID PRN as patient is about to be discharged by Dr. Eden Emms. Xanax was a medication pt was taking for anxiety as an inpatient.

## 2015-01-17 NOTE — Discharge Summary (Signed)
Discharge Summary   Patient ID: Laura Herrera,  MRN: [id number], DOB/AGE: 63-Jan-1952 63 y.o.  Admit date: 01/11/2015 Discharge date: 01/17/2015  Primary Care Provider: No primary care provider on file. Primary Cardiologist: Ranae Palms, MD   Discharge Diagnoses Principal Problem:   ST elevation myocardial infarction (STEMI) of inferior wall (Peak troponin of 17.45)  **S/P complex PCI and drug-eluting stent placement to the ostial, proximal, and mid RCA this admission, along with PTCA of the distal RCA.  Relook cath performed secondary to ongoing chest pain.  Active Problems:   CAD (coronary artery disease)   Essential hypertension   Dyslipidemia  Allergies Allergies  Allergen Reactions  . Cholinesterase Inhibitors Other (See Comments)    Enzyme deficiency  . Shellfish-Derived Products Other (See Comments)    Crabs, lobster, crayfish  . Vancomycin     REACTION: severe rash/hives    Procedures  Cardiac Catheterization and Percutaneous Coronary Intervention [id number]  Coronary Findings     Dominance: Right    Left Main  The vessel , is large .      Left Anterior Descending  The vessel , is moderate in size is angiographically normal. The vessel is tortuous. Extremely   . Mid LAD lesion, 20% stenosed. diffuse .   Marland Kitchen First Diagonal Branch   The vessel is moderate in size and exhibits minimal luminal irregularities.   . First Septal Branch   The vessel is small in size. Gives collaterals to the RPDA   . Third Diagonal Branch   The vessel is small in size.      Left Circumflex  The vessel , is moderate in size is angiographically normal. The vessel is tortuous. Extremely tortuous Gives collaterals to the RPL system   . First Obtuse Marginal Branch   The vessel is small in size. There is moderate.   . Second Obtuse Marginal Branch   The vessel is small in size. There is mild.      Right Coronary Artery  The vessel , is moderate in size is angiographically normal. The  vessel is tortuous. Extremely   . Ost RCA lesion, 80% stenosed. moderately calcified, discrete, eccentric . This lesion became more pronounced after flow was restored down the native right.   Marland Kitchen PCI: The ostial RCA was successfully treated with a 2.75 x 12 mm Promus Premier DES.  Marland Kitchen There is a 10% residual stenosis post intervention.     . Prox RCA lesion, 100% stenosed. diffuse, with heavy thrombus, ulcerative , and has left-to-right collateral flow.   Marland Kitchen PCI: The proximal RCA was successfully stented using a 2.5 x 24 mm Promus Premier DES.  Marland Kitchen There is no residual stenosis post intervention.     . Prox RCA to Mid RCA lesion, 70% stenosed. thrombotic, with heavy thrombus, eccentric, ulcerative .   Marland Kitchen PCI: The proximal to mid RCA was successfully stented using a 2.5 x 24 mm Promus Premier DES.  Marland Kitchen There is no residual stenosis post intervention.     . Dist RCA lesion, 90% stenosed. tubular .   Marland Kitchen Angioplasty: The distal RCA was treated with balloon angioplasty only.  . There is a 10% residual stenosis post intervention.     . Acute Marginal Branch   The vessel is small in size.   . Right Posterior Descending Artery   The vessel is moderate in size.   . First Right Posterolateral   The vessel is small in size.   Marland Kitchen Second Right Posterolateral  The vessel is small in size.    _____________  Relook Cardiac Catheterization 6.7.2016  Coronary Findings     Dominance: Right    Left Main  The vessel , is large .      Left Anterior Descending  The vessel , is moderate in size is angiographically normal. The vessel is tortuous. Extremely   . Mid LAD lesion, 20% stenosed. diffuse .   Marland Kitchen First Diagonal Branch   The vessel is moderate in size and exhibits minimal luminal irregularities.   . First Septal Branch   The vessel is small in size. Gives collaterals to the RPDA   . Third Diagonal Branch   The vessel is small in size.      Left Circumflex  The vessel , is moderate in size is  angiographically normal. The vessel is tortuous. Extremely tortuous Gives collaterals to the RPL system   . First Obtuse Marginal Branch   The vessel is small in size. There is moderate.   . Second Obtuse Marginal Branch   The vessel is small in size. There is mild.      Right Coronary Artery  The vessel , is moderate in size is angiographically normal. The vessel is tortuous. Extremely   . Ost RCA lesion, 10% stenosed. moderately calcified, discrete, eccentric . The lesion was previously treated with a drug-eluting stent . This lesion became more pronounced after flow was restored down the native right.   . Prox RCA lesion, 0% stenosed. diffuse, with heavy thrombus, ulcerative , and has left-to-right collateral flow. Previously placed Prox RCA drug eluting stent is patent.   . Prox RCA to Mid RCA lesion, 0% stenosed. thrombotic, with heavy thrombus, eccentric, ulcerative . Previously placed Prox RCA to Mid RCA drug eluting stent is patent.   . Dist RCA lesion, 10% stenosed. tubular . The lesion was previously treated.   . Acute Marginal Branch   The vessel is small in size.   . Right Posterior Descending Artery   The vessel is moderate in size.   . First Right Posterolateral   The vessel is small in size.   Marland Kitchen Second Right Posterolateral   The vessel is small in size.     **Patient was medically managed with IV Tirofiban therapy.** _____________  2D Echocardiogram 6.7.2016  Study Conclusions  - Left ventricle: The cavity size was normal. Wall thickness was   increased in a pattern of mild LVH. Systolic function was normal.   The estimated ejection fraction was in the range of 60% to 65%.   Wall motion was normal; there were no regional wall motion   abnormalities. Left ventricular diastolic function parameters   were normal. - Aortic valve: There was moderate regurgitation. - Mitral valve: Calcified annulus. - Pulmonary arteries: PA peak pressure: 37 mm Hg (S). _____________     History of Present Illness  64 year old female with a prior history of hypertension and hyperlipidemia who was in her usual state of health until approximately 3 days prior to admission when she began to experience intermittent chest discomfort. On the evening of June 6, she developed more severe chest pressure prompting her to call EMS. Upon arrival, ECG showed inferior ST segment elevation and a code STEMI was activated. Patient was taken emergently to the [facility] cardiac catheterization laboratory.  Hospital Course  Patient underwent emergent diagnostic catheterization revealing severe ostial and proximal RCA disease with a total occlusion in the proximal to midsection of the artery. She was noted  to have significant tortuosity of the right coronary artery. The LAD and left circumflex had nonobstructive disease. She underwent percutaneous intervention with drug eluting stent placement with the ostial RCA, proximal RCA, and proximal mid RCA. She also had distal disease which was successfully treated with balloon angioplasty only. Unfortunately, she continued to have chest discomfort and ongoing ST segment elevation following the procedure. She was taken back to the cardiac catheterization laboratory on the morning of June 7 with a relook catheterization revealed heavy thrombus throughout the proximal and mid RCA with TIMI 3 flow. The interventional team had difficulty engaging the ostium of the right coronary artery secondary to extension of the previously placed ostial stent into the aorta. As result, no intervention was performed instead medical therapy was recommended. Patient was placed on IV tirofiban therapy and transferred back to the coronary intensive care unit. She eventually peaked troponin at 17.45. 2-D echocardiography was carried out on June 17 revealing normal LV function without wall motion abnormalities. Patient was placed on aspirin, brilinta, beta blocker, high potency statin, and  nitrate therapy. She continued to have intermittent chest discomfort however troponins trended down. She was eventually transferred to the floor and has been seen by cardiac rehabilitation. Over the past 24 hours, she's been running without difficulty. She is continued to have mild pleuritic chest discomfort. ECG remains notable for mild, persistent ST elevation. Patient is felt to be stable for discharge today. Will arrange for follow-up in our office within the next 7 days. We have also arranged for outpatient carotid ultrasound secondary to a finding of soft carotid bruits on examination.  Discharge Vitals Blood pressure 113/45, pulse 71, temperature 99.3 F (37.4 C), temperature source Oral, resp. rate 18, height 5' 7.32" (1.71 m), weight 182 lb 3.2 oz (82.645 kg), SpO2 95 %.  Filed Weights   01/15/15 0500 01/16/15 0608 01/17/15 0300  Weight: 179 lb 14.4 oz (81.602 kg) 183 lb 6.4 oz (83.19 kg) 182 lb 3.2 oz (82.645 kg)   Labs  CBC Lab Results  Component Value Date   WBC 14.0* 01/11/2015   HGB 11.2* 01/12/2015   HCT 33.0* 01/12/2015   MCV 92.3 01/11/2015   PLT 317 01/11/2015    Basic Metabolic Panel Lab Results  Component Value Date   CREATININE 0.70 01/12/2015   BUN 9 01/12/2015   NA 138 01/12/2015   K 3.6 01/12/2015   CL 106 01/12/2015   CO2 18* 01/11/2015    Liver Function Tests Lab Results  Component Value Date   ALT 35 01/11/2015   AST 90* 01/11/2015   ALKPHOS 75 01/11/2015   BILITOT 0.8 01/11/2015    Cardiac Enzymes  Recent Labs  01/15/15 1009 01/15/15 2208  TROPONINI 3.84* 3.20*   **Peak troponin of 17.45.**  Hemoglobin A1C Lab Results  Component Value Date   HGBA1C 5.7* 01/11/2015    Fasting Lipid Panel Lab Results  Component Value Date   CHOL 147 01/12/2015   HDL 58 01/12/2015   LDLCALC 75 01/12/2015   TRIG 69 01/12/2015   CHOLHDL 2.5 01/12/2015    Thyroid Function Tests Lab Results  Component Value Date   TSH 3.035 01/11/2015      Disposition  Pt is being discharged home today in good condition.  Follow-up Plans & Appointments  Follow-up Information    Follow up with [facility], Piedad Climes, MD.   Specialty:  Cardiology   Why:  we will arrange for follow-up within the next week and contact you.   Contact  information:   [address] [place] [address] [phone]      Discharge Medications    Medication List    STOP taking these medications        amLODipine 2.5 MG tablet  Commonly known as:  NORVASC      TAKE these medications        aspirin 81 MG EC tablet  Take 1 tablet (81 mg total) by mouth daily.     atorvastatin 80 MG tablet  Commonly known as:  LIPITOR  Take 1 tablet (80 mg total) by mouth daily.     clobetasol cream 0.05 %  Commonly known as:  TEMOVATE  Apply 1 application topically 2 (two) times daily.     isosorbide mononitrate 30 MG 24 hr tablet  Commonly known as:  IMDUR  Take 0.5 tablets (15 mg total) by mouth daily.     metoprolol tartrate 25 MG tablet  Commonly known as:  LOPRESSOR  Take 0.5 tablets (12.5 mg total) by mouth 2 (two) times daily.     nitroGLYCERIN 0.4 MG SL tablet  Commonly known as:  NITROSTAT  Place 1 tablet (0.4 mg total) under the tongue every 5 (five) minutes x 3 doses as needed for chest pain.     ticagrelor 90 MG Tabs tablet  Commonly known as:  BRILINTA  Take 1 tablet (90 mg total) by mouth 2 (two) times daily.       Outstanding Labs/Studies  **Carotid U/S to be scheduled. **F/U lipids/lft's in 8 wks.  Duration of Discharge Encounter   Greater than 30 minutes including physician time.  Signed, Nicolasa Ducking NP 01/17/2015, 8:57 AM

## 2015-01-19 ENCOUNTER — Telehealth: Payer: Self-pay | Admitting: Nurse Practitioner

## 2015-01-19 ENCOUNTER — Telehealth: Payer: Self-pay | Admitting: *Deleted

## 2015-01-19 NOTE — Telephone Encounter (Signed)
TOC call.  Left message for the pt to call back.

## 2015-01-19 NOTE — Telephone Encounter (Signed)
FAXED  -SIGNED BY DR HARDING -CARDIAC REHAB PHASE II ORDER  

## 2015-01-19 NOTE — Telephone Encounter (Signed)
Patient contacted regarding discharge from [facility] on 01/17/15.  Patient understands to follow up with provider Norma Fredrickson NP on 01/25/15 at 11:30 am at [facility]. Patient understands discharge instructions? yes Patient understands medications and regiment? yes Patient understands to bring all medications to this visit? yes

## 2015-01-19 NOTE — Telephone Encounter (Signed)
6/20 @ 11:30 w/ Lawson Fiscal  7 day TOC per Ruthe Mannan message

## 2015-01-20 ENCOUNTER — Encounter: Payer: Self-pay | Admitting: *Deleted

## 2015-01-20 ENCOUNTER — Observation Stay (HOSPITAL_BASED_OUTPATIENT_CLINIC_OR_DEPARTMENT_OTHER)
Admission: EM | Admit: 2015-01-20 | Discharge: 2015-01-22 | Disposition: A | Discharge status: Home / Self Care | Payer: BLUE CROSS/BLUE SHIELD | Source: Home / Self Care | Attending: Emergency Medicine | Admitting: Emergency Medicine

## 2015-01-20 ENCOUNTER — Telehealth: Payer: Self-pay | Admitting: *Deleted

## 2015-01-20 ENCOUNTER — Emergency Department (HOSPITAL_COMMUNITY): Payer: BLUE CROSS/BLUE SHIELD

## 2015-01-20 ENCOUNTER — Encounter (HOSPITAL_COMMUNITY): Payer: Self-pay | Admitting: *Deleted

## 2015-01-20 DIAGNOSIS — R51 Headache: Secondary | ICD-10-CM | POA: Insufficient documentation

## 2015-01-20 DIAGNOSIS — F329 Major depressive disorder, single episode, unspecified: Secondary | ICD-10-CM | POA: Insufficient documentation

## 2015-01-20 DIAGNOSIS — Z87891 Personal history of nicotine dependence: Secondary | ICD-10-CM | POA: Diagnosis not present

## 2015-01-20 DIAGNOSIS — Z7902 Long term (current) use of antithrombotics/antiplatelets: Secondary | ICD-10-CM | POA: Diagnosis not present

## 2015-01-20 DIAGNOSIS — Z9889 Other specified postprocedural states: Secondary | ICD-10-CM | POA: Diagnosis not present

## 2015-01-20 DIAGNOSIS — I10 Essential (primary) hypertension: Secondary | ICD-10-CM | POA: Insufficient documentation

## 2015-01-20 DIAGNOSIS — Z8614 Personal history of Methicillin resistant Staphylococcus aureus infection: Secondary | ICD-10-CM

## 2015-01-20 DIAGNOSIS — E785 Hyperlipidemia, unspecified: Secondary | ICD-10-CM

## 2015-01-20 DIAGNOSIS — I2510 Atherosclerotic heart disease of native coronary artery without angina pectoris: Secondary | ICD-10-CM | POA: Diagnosis not present

## 2015-01-20 DIAGNOSIS — Z7952 Long term (current) use of systemic steroids: Secondary | ICD-10-CM | POA: Diagnosis not present

## 2015-01-20 DIAGNOSIS — Z79899 Other long term (current) drug therapy: Secondary | ICD-10-CM | POA: Diagnosis not present

## 2015-01-20 DIAGNOSIS — R079 Chest pain, unspecified: Secondary | ICD-10-CM | POA: Diagnosis present

## 2015-01-20 DIAGNOSIS — Z872 Personal history of diseases of the skin and subcutaneous tissue: Secondary | ICD-10-CM

## 2015-01-20 DIAGNOSIS — I252 Old myocardial infarction: Secondary | ICD-10-CM | POA: Insufficient documentation

## 2015-01-20 DIAGNOSIS — R1110 Vomiting, unspecified: Secondary | ICD-10-CM | POA: Insufficient documentation

## 2015-01-20 DIAGNOSIS — Z7982 Long term (current) use of aspirin: Secondary | ICD-10-CM

## 2015-01-20 DIAGNOSIS — G4700 Insomnia, unspecified: Secondary | ICD-10-CM | POA: Insufficient documentation

## 2015-01-20 DIAGNOSIS — F419 Anxiety disorder, unspecified: Secondary | ICD-10-CM | POA: Diagnosis not present

## 2015-01-20 NOTE — ED Notes (Addendum)
Pt arrives EMS from home c/o CP. Pt was recently seen and had a cath done. Cards at bedside to see pt. Pt received a total of 4 NTG and 324 ASA prior to arrival pain went from 5/10 to 2/10.

## 2015-01-20 NOTE — Telephone Encounter (Signed)
spoke to pt,.Laura KitchenMarland Herrera

## 2015-01-21 DIAGNOSIS — I10 Essential (primary) hypertension: Secondary | ICD-10-CM | POA: Diagnosis not present

## 2015-01-21 DIAGNOSIS — R079 Chest pain, unspecified: Secondary | ICD-10-CM | POA: Diagnosis present

## 2015-01-21 DIAGNOSIS — E785 Hyperlipidemia, unspecified: Secondary | ICD-10-CM | POA: Diagnosis not present

## 2015-01-21 DIAGNOSIS — I252 Old myocardial infarction: Secondary | ICD-10-CM | POA: Diagnosis not present

## 2015-01-21 DIAGNOSIS — I2510 Atherosclerotic heart disease of native coronary artery without angina pectoris: Secondary | ICD-10-CM

## 2015-01-21 DIAGNOSIS — I209 Angina pectoris, unspecified: Secondary | ICD-10-CM

## 2015-01-21 LAB — CBC
HCT: 30.4 % — ABNORMAL LOW (ref 36.0–46.0)
HEMATOCRIT: 27.4 % — AB (ref 36.0–46.0)
HEMOGLOBIN: 10.1 g/dL — AB (ref 12.0–15.0)
HEMOGLOBIN: 9.2 g/dL — AB (ref 12.0–15.0)
MCH: 30.2 pg (ref 26.0–34.0)
MCH: 30.3 pg (ref 26.0–34.0)
MCHC: 33.2 g/dL (ref 30.0–36.0)
MCHC: 33.6 g/dL (ref 30.0–36.0)
MCV: 90.1 fL (ref 78.0–100.0)
MCV: 91 fL (ref 78.0–100.0)
PLATELETS: 455 10*3/uL — AB (ref 150–400)
Platelets: 449 10*3/uL — ABNORMAL HIGH (ref 150–400)
RBC: 3.04 MIL/uL — ABNORMAL LOW (ref 3.87–5.11)
RBC: 3.34 MIL/uL — ABNORMAL LOW (ref 3.87–5.11)
RDW: 12.5 % (ref 11.5–15.5)
RDW: 12.6 % (ref 11.5–15.5)
WBC: 9.1 10*3/uL (ref 4.0–10.5)
WBC: 9.5 10*3/uL (ref 4.0–10.5)

## 2015-01-21 LAB — BASIC METABOLIC PANEL
ANION GAP: 9 (ref 5–15)
BUN: 15 mg/dL (ref 6–20)
CO2: 17 mmol/L — AB (ref 22–32)
CREATININE: 0.94 mg/dL (ref 0.44–1.00)
Calcium: 8.8 mg/dL — ABNORMAL LOW (ref 8.9–10.3)
Chloride: 109 mmol/L (ref 101–111)
GFR calc non Af Amer: >60 mL/min (ref >60)
Glucose, Bld: 107 mg/dL — ABNORMAL HIGH (ref 65–99)
POTASSIUM: 3.5 mmol/L (ref 3.5–5.1)
SODIUM: 135 mmol/L (ref 135–145)

## 2015-01-21 LAB — I-STAT TROPONIN, ED: TROPONIN I, POC: 0.13 ng/mL — AB (ref 0.00–0.08)

## 2015-01-21 LAB — TROPONIN I
TROPONIN I: 0.2 ng/mL — AB (ref <0.031)
Troponin I: 0.2 ng/mL — ABNORMAL HIGH (ref <0.031)
Troponin I: 0.15 ng/mL — ABNORMAL HIGH (ref <0.031)

## 2015-01-21 LAB — BRAIN NATRIURETIC PEPTIDE: B Natriuretic Peptide: 173.4 pg/mL — ABNORMAL HIGH (ref 0.0–100.0)

## 2015-01-21 LAB — MRSA PCR SCREENING: MRSA by PCR: NEGATIVE

## 2015-01-21 MED ORDER — ATORVASTATIN CALCIUM 80 MG PO TABS
80.0000 mg | ORAL_TABLET | Freq: Every day | ORAL | Status: DC
  Administered 2015-01-22: 80 mg via ORAL
  Filled 2015-01-21: qty 1

## 2015-01-21 MED ORDER — MORPHINE SULFATE 2 MG/ML IJ SOLN: 2.0000 mg | INTRAMUSCULAR | Status: DC | PRN

## 2015-01-21 MED ORDER — POTASSIUM CHLORIDE CRYS ER 20 MEQ PO TBCR
40.0000 meq | EXTENDED_RELEASE_TABLET | Freq: Once | ORAL | Status: AC
  Administered 2015-01-21: 40 meq via ORAL
  Filled 2015-01-21: qty 2

## 2015-01-21 MED ORDER — METOPROLOL TARTRATE 12.5 MG HALF TABLET
12.5000 mg | ORAL_TABLET | Freq: Two times a day (BID) | ORAL | Status: DC
  Administered 2015-01-21 (×2): 12.5 mg via ORAL
  Filled 2015-01-21 (×3): qty 1

## 2015-01-21 MED ORDER — TICAGRELOR 90 MG PO TABS
90.0000 mg | ORAL_TABLET | Freq: Two times a day (BID) | ORAL | Status: DC
  Administered 2015-01-21 – 2015-01-22 (×3): 90 mg via ORAL
  Filled 2015-01-21 (×3): qty 1

## 2015-01-21 MED ORDER — RANOLAZINE ER 500 MG PO TB12
500.0000 mg | ORAL_TABLET | Freq: Two times a day (BID) | ORAL | Status: DC
  Administered 2015-01-21 – 2015-01-22 (×3): 500 mg via ORAL
  Filled 2015-01-21 (×3): qty 1

## 2015-01-21 MED ORDER — ONDANSETRON HCL 4 MG/2ML IJ SOLN: 4.0000 mg | Freq: Four times a day (QID) | INTRAMUSCULAR | Status: DC | PRN

## 2015-01-21 MED ORDER — PANTOPRAZOLE SODIUM 40 MG PO TBEC
40.0000 mg | DELAYED_RELEASE_TABLET | Freq: Every day | ORAL | Status: DC
Start: 2015-01-21 — End: 2015-01-22
  Administered 2015-01-21 – 2015-01-22 (×2): 40 mg via ORAL
  Filled 2015-01-21 (×2): qty 1

## 2015-01-21 MED ORDER — GI COCKTAIL ~~LOC~~: 30.0000 mL | Freq: Four times a day (QID) | ORAL | Status: DC | PRN

## 2015-01-21 MED ORDER — ISOSORBIDE MONONITRATE ER 30 MG PO TB24
15.0000 mg | ORAL_TABLET | Freq: Every day | ORAL | Status: DC
  Administered 2015-01-21: 15 mg via ORAL
  Filled 2015-01-21 (×2): qty 1

## 2015-01-21 MED ORDER — ALPRAZOLAM 0.25 MG PO TABS: 0.2500 mg | ORAL_TABLET | Freq: Three times a day (TID) | ORAL | Status: DC | PRN

## 2015-01-21 MED ORDER — ALPRAZOLAM 0.5 MG PO TABS
0.5000 mg | ORAL_TABLET | Freq: Three times a day (TID) | ORAL | Status: DC | PRN
  Administered 2015-01-21: 0.5 mg via ORAL
  Filled 2015-01-21: qty 1

## 2015-01-21 MED ORDER — ASPIRIN EC 81 MG PO TBEC
81.0000 mg | DELAYED_RELEASE_TABLET | Freq: Every day | ORAL | Status: DC
  Administered 2015-01-21 – 2015-01-22 (×2): 81 mg via ORAL
  Filled 2015-01-21 (×2): qty 1

## 2015-01-21 MED ORDER — ALPRAZOLAM 0.25 MG PO TABS
0.2500 mg | ORAL_TABLET | Freq: Three times a day (TID) | ORAL | Status: DC | PRN
  Administered 2015-01-21 – 2015-01-22 (×3): 0.25 mg via ORAL
  Filled 2015-01-21 (×3): qty 1

## 2015-01-21 MED ORDER — ACETAMINOPHEN 325 MG PO TABS: 650.0000 mg | ORAL_TABLET | ORAL | Status: DC | PRN

## 2015-01-21 MED ORDER — CLOBETASOL PROPIONATE 0.05 % EX CREA
1.0000 "application " | TOPICAL_CREAM | Freq: Two times a day (BID) | CUTANEOUS | Status: DC
  Filled 2015-01-21: qty 15

## 2015-01-21 MED ORDER — NITROGLYCERIN 0.4 MG SL SUBL: 0.4000 mg | SUBLINGUAL_TABLET | SUBLINGUAL | Status: DC | PRN

## 2015-01-21 NOTE — Progress Notes (Signed)
Paged Cards fellow for PRN 0.5 Xanax order, per home med rec

## 2015-01-21 NOTE — ED Provider Notes (Signed)
Pt not seen by EDP, met by cardiology upon arrival.  Pt to be admitted to cardiology service.  Linton Flemings, MD 01/21/15 [phone]

## 2015-01-21 NOTE — H&P (Signed)
Laura Herrera is an 64 y.o. female.   Chief Complaint: chest pain Primary Cardiologist: new HPI: Laura Herrera is a 64 yo woman with PMH of hypertension, dyslipidemia and CAD with STEMI 01/11/15 of the RCA with mid/distal and ostial disease who went home with some minor occasional chest discomfort but has had several episodes of more chest discomfort leading to re-presentation. Her symptoms improved with SL NTG but it gives her a headache. She has challenging RCA lesion that is not amenable to PCI with the current thought that if further revascularization needed above medical therapy that CABG would need to be considered. She was doing okay for the last few days at home when her symptoms came on again while eating this evening. Pain was as bad as 4-5/10 but improved with SL NTG 2-3/10 with pain in her left shoulder as well. Discharged summary 6/12 and previous notes reviewed. She's been compliant on her medications at home not missing any aspirin or brilinta. ECG was called STEMI in the field but largely unchanged with known significant inferior Qs and some mild ST elevation. She is currently feeling much improved.   Past Medical History  Diagnosis Date  . Psoriasis   . MRSA infection     h/o MRSA infection in the right foot  . Anxiety   . Depression   . Insomnia   . PONV (postoperative nausea and vomiting)   . Coronary artery disease     a. 01/2015 Inf STEMI/PCI: RCA 80ost (2.75x12 Promus Premier DES), 100p (2.5x24 Promus Premier DES), 70p/m (2.5x24 Promus Premier DES), 90d (PTCA);  b. 01/2015 Relook Cath: LM nl, LAD 84m, D1 min irregs, LCX nl- L->R collats,  OM1 mod dzs, OM2 mild dizs, RCA 10ost, heavy thrombus p/m, 10d->Aggrastat;  c. 01/2015 Echo: EF 60-65%, no rwma, mod AI, PASP 34mmHg.  Marland Kitchen Hypertension   . Dyslipidemia     Past Surgical History  Procedure Laterality Date  . Cardiac catheterization    . Cardiac catheterization N/A 01/11/2015    Procedure: Left Heart Cath and Coronary  Angiography;  Surgeon: Leonie Man, MD;  Location: [facility];  Service: Cardiovascular;  Laterality: N/A;  . Cardiac catheterization  01/11/2015    Procedure: Coronary Stent Intervention;  Surgeon: Leonie Man, MD;  Location: [facility];  Service: Cardiovascular;;  . Cardiac catheterization  01/11/2015    Procedure: Coronary Balloon Angioplasty;  Surgeon: Leonie Man, MD;  Location: [facility];  Service: Cardiovascular;;  . Cardiac catheterization N/A 01/12/2015    Procedure: Left Heart Cath and Coronary Angiography;  Surgeon: Belva Crome, MD;  Location: [place];  Service: Cardiovascular;  Laterality: N/A;  . Angioplasty  01/12/2015    Procedure: Angioplasty;  Surgeon: Belva Crome, MD;  Location: [facility];  Service: Cardiovascular;;    Family History  Problem Relation Age of Onset  . Peripheral vascular disease Mother   . Hyperlipidemia Mother   . Alzheimer's disease Father   . Mental illness Sister   . Heart Problems Sister   . Schizophrenia Sister   . Hypotension Sister   . Hyperlipidemia Brother   . Heart disease Maternal Grandmother    Social History:  reports that she has quit smoking. Her smoking use included Cigarettes. She does not have any smokeless tobacco history on file. She reports that she drinks about 1.2 oz of alcohol per week. She reports that she does not use illicit drugs.  Allergies:  Allergies  Allergen Reactions  . Cholinesterase Inhibitors Other (See Comments)    Enzyme deficiency  . Shellfish-Derived Products Other (See Comments)    Crabs, lobster, crayfish  . Vancomycin     REACTION: severe rash/hives     (Not in a hospital admission)  Results for orders placed or performed during the hospital encounter of 01/20/15 (from the past 48 hour(s))  CBC     Status: Abnormal   Collection Time: 01/20/15 11:58 PM  Result Value Ref Range   WBC 9.5 4.0 - 10.5 K/uL   RBC 3.04 (L) 3.87 - 5.11 MIL/uL    Hemoglobin 9.2 (L) 12.0 - 15.0 g/dL   HCT 27.4 (L) 36.0 - 46.0 %   MCV 90.1 78.0 - 100.0 fL   MCH 30.3 26.0 - 34.0 pg   MCHC 33.6 30.0 - 36.0 g/dL   RDW 12.5 11.5 - 15.5 %   Platelets 449 (H) 150 - 400 K/uL  Basic metabolic panel     Status: Abnormal   Collection Time: 01/20/15 11:58 PM  Result Value Ref Range   Sodium 135 135 - 145 mmol/L   Potassium 3.5 3.5 - 5.1 mmol/L   Chloride 109 101 - 111 mmol/L   CO2 17 (L) 22 - 32 mmol/L   Glucose, Bld 107 (H) 65 - 99 mg/dL   BUN 15 6 - 20 mg/dL   Creatinine, Ser 0.94 0.44 - 1.00 mg/dL   Calcium 8.8 (L) 8.9 - 10.3 mg/dL   GFR calc non Af Amer >60 >60 mL/min   GFR calc Af Amer >60 >60 mL/min    Comment: (NOTE) The eGFR has been calculated using the CKD EPI equation. This calculation has not been validated in all clinical situations. eGFR's persistently <60 mL/min signify possible Chronic Kidney Disease.    Anion gap 9 5 - 15  I-stat troponin, ED  (not at [facility], [facility])     Status: Abnormal   Collection Time: 01/21/15 12:04 AM  Result Value Ref Range   Troponin i, poc 0.13 (HH) 0.00 - 0.08 ng/mL   Comment NOTIFIED PHYSICIAN    Comment 3            Comment: Due to the release kinetics of cTnI, a negative result within the first hours of the onset of symptoms does not rule out myocardial infarction with certainty. If myocardial infarction is still suspected, repeat the test at appropriate intervals.    Dg Chest Port 1 View  01/21/2015   CLINICAL DATA:  Acute onset of left-sided shoulder pain. Initial encounter.  EXAM: PORTABLE CHEST - 1 VIEW  COMPARISON:  Chest radiograph from 11/28/2012  FINDINGS: The lungs are well-aerated and clear. There is no evidence of focal opacification, pleural effusion or pneumothorax.  The cardiomediastinal silhouette is within normal limits. No acute osseous abnormalities are seen. A small hiatal hernia is again seen.  IMPRESSION: 1. No acute cardiopulmonary process seen. 2. Small hiatal hernia again noted.    Electronically Signed   By: Garald Balding M.D.   On: 01/21/2015 00:12    Review of Systems  Constitutional: Positive for malaise/fatigue. Negative for fever and chills.  HENT: Negative for ear pain.   Eyes: Negative for blurred vision and double vision.  Respiratory: Negative for cough, hemoptysis and shortness of breath.   Cardiovascular: Positive for chest pain. Negative for palpitations and PND.  Gastrointestinal: Positive for vomiting. Negative for nausea and abdominal pain.  Genitourinary: Negative for dysuria and hematuria.  Musculoskeletal: Negative for myalgias  and neck pain.  Skin: Negative for rash.  Neurological: Positive for headaches. Negative for tingling, tremors and sensory change.  Endo/Heme/Allergies: Negative for polydipsia.  Psychiatric/Behavioral: Negative for depression, suicidal ideas and substance abuse.    Blood pressure 104/45, pulse 61, temperature 99.2 F (37.3 C), temperature source Oral, resp. rate 16, height $RemoveBe'5\' 7"'hoAdfAkzd$  (1.702 m), weight 82.555 kg (182 lb), SpO2 100 %. Physical Exam  Nursing note and vitals reviewed. Constitutional: She is oriented to person, place, and time. She appears well-developed and well-nourished. No distress.  HENT:  Head: Normocephalic and atraumatic.  Nose: Nose normal.  Mouth/Throat: Oropharynx is clear and moist. No oropharyngeal exudate.  Eyes: Conjunctivae and EOM are normal. Pupils are equal, round, and reactive to light. No scleral icterus.  Neck: Normal range of motion. Neck supple. No JVD present. No tracheal deviation present.  Cardiovascular: Normal rate, regular rhythm, normal heart sounds and intact distal pulses.  Exam reveals no gallop.   No murmur heard. Respiratory: Effort normal and breath sounds normal. No respiratory distress. She has no wheezes. She has no rales.  GI: Soft. Bowel sounds are normal. She exhibits no distension. There is no tenderness. There is no rebound.  Musculoskeletal: Normal range of motion.  She exhibits no edema or tenderness.  Neurological: She is alert and oriented to person, place, and time. No cranial nerve deficit.  Skin: Skin is warm and dry. No rash noted. She is not diaphoretic. No erythema.  Psychiatric: She has a normal mood and affect. Her behavior is normal.    Labs reviewed; echo reviewed; cath reviewed from May and June  Assessment/Plan Ms. Laura Herrera is a 64 yo woman with PMH of hypertension, dyslipidemia and CAD with STEMI 01/11/15 of the RCA with mid/distal and ostial disease who went home with some minor occasional chest discomfort but has had several episodes of more chest discomfort leading to re-presentation. She has known disease - query rather current episode of progressive infarction of her RCA territory, vasospasm, anxiety or multifactorial etiology for her discomfort. For now, will treat medically with pain medication and blood pressure/HR reduction while trending cardiac biomarkers. POC troponin only 0.13. Will observe overnight, trend cardiac biomarkers and reassess in AM. Differential diagnosis is musculoskeletal pain, esophageal spasm, GERD, pericarditis, ACS/NSTEMI among other etiologies. I favor a diagnosis of known underlying ostial disease and recent STEMI with continued healing and microvascular disease.  Problem List Chest Pain Recent STEMI - known CAD Dyslipidemia Hypertension Plan.  - NPO after MN just in case - trend cardiac markers - observation on telemetry - asa 81 mg, continue home  Asa 81 mg, brilinta 90 mg bid, atoravstatin 80 mg qHS, imdur 30 mg, metoprolol 25 mg bid    Wavie Hashimi 01/21/2015, 1:30 AM

## 2015-01-21 NOTE — Progress Notes (Signed)
Ordered 0.5mg  Xanax PRN TID, per Dr Tresa Endo. Also, not administered Brilinta and Lipitor after pt said she took all her meds at 2200. Pharmacist said to hold off until next administration time

## 2015-01-21 NOTE — Progress Notes (Addendum)
Patient: Laura Herrera / Admit Date: 01/20/2015 / Date of Encounter: 01/21/2015, 10:30 AM   Subjective: Chest pain improved overnight - just barely even able to feel it this AM. She said if she had to call it something, it'd be a 1/10 if that. No recent worsening of chest pain with ambulation at home. No SOB.   Objective: Telemetry: NSR Physical Exam: Blood pressure 105/44, pulse 60, temperature 97.9 F (36.6 C), temperature source Oral, resp. rate 20, height 5\' 7"  (1.702 m), weight 170 lb 12.8 oz (77.474 kg), SpO2 98 %. General: Well developed, well nourished WF, in no acute distress. Head: Normocephalic, atraumatic, sclera non-icteric, no xanthomas, nares are without discharge. Neck: Bilateral soft carotid bruits. JVP not elevated. Lungs: Clear bilaterally to auscultation without wheezes, rales, or rhonchi. Breathing is unlabored. Heart: RRR S1 S2 without murmurs, rubs, or gallops.  Abdomen: Soft, non-tender, non-distended with normoactive bowel sounds. No rebound/guarding. Extremities: No clubbing or cyanosis. No edema. Distal pedal pulses are 2+ and equal bilaterally. Neuro: Alert and oriented X 3. Moves all extremities spontaneously. Psych:  Responds to questions appropriately with a normal affect.  No intake or output data in the 24 hours ending 01/21/15 1030  Inpatient Medications:  . aspirin EC  81 mg Oral Daily  . atorvastatin  80 mg Oral Daily  . clobetasol cream  1 application Topical BID  . isosorbide mononitrate  15 mg Oral Daily  . metoprolol tartrate  12.5 mg Oral BID  . ticagrelor  90 mg Oral BID   Infusions:    Labs:  Recent Labs  01/20/15 2358  NA 135  K 3.5  CL 109  CO2 17*  GLUCOSE 107*  BUN 15  CREATININE 0.94  CALCIUM 8.8*   No results for input(s): AST, ALT, ALKPHOS, BILITOT, PROT, ALBUMIN in the last 72 hours.  Recent Labs  01/20/15 2358 01/21/15 0800  WBC 9.5 9.1  HGB 9.2* 10.1*  HCT 27.4* 30.4*  MCV 90.1 91.0  PLT 449* 455*     Recent Labs  01/21/15 0346 01/21/15 0800  TROPONINI 0.20* 0.20*   Invalid input(s): POCBNP No results for input(s): HGBA1C in the last 72 hours.   Radiology/Studies:  Dg Chest Port 1 View  01/21/2015   CLINICAL DATA:  Acute onset of left-sided shoulder pain. Initial encounter.  EXAM: PORTABLE CHEST - 1 VIEW  COMPARISON:  Chest radiograph from 11/28/2012  FINDINGS: The lungs are well-aerated and clear. There is no evidence of focal opacification, pleural effusion or pneumothorax.  The cardiomediastinal silhouette is within normal limits. No acute osseous abnormalities are seen. A small hiatal hernia is again seen.  IMPRESSION: 1. No acute cardiopulmonary process seen. 2. Small hiatal hernia again noted.   Electronically Signed   By: Roanna Raider M.D.   On: 01/21/2015 00:12     Assessment and Plan  1. Recurrent chest pain - POC troponin 0.13, full troponin 0.20->0.20 (unable to firm up or down trend), EKG yesterday with continued ST elevations that were present from prior admission. EKG this AM shows very minimal ST elevation in lead I, II, V6, rounded TWI in III, avF, QTc . Per tentative discussion with Dr. Tresa Endo, add Ranexa. Will add PPI as well since Hgb has gone down given recent anticoagulation/antiplatelets.  2. CAD - recent inferior STEMI last week with complex PCI/DES to ostial/prox/mRCA along with PTCA of distal RCA. Had residual chest pain after MI with relook cath 01/12/15 revealing heavy thrombus throughout the prox & mRCA -  interventional team had difficulty engaging the ostium of the RCA secondary to extension of the previously placed ostial stent into the aorta - medical therapy recommended, treated with tirofiban. Continued to have intermittent CP but troponins trended down. EF 60-65% with mod AI last admission.   3. HTN - BP has tendency to run on soft side, limiting aggressive med titration.  4. Dyslipidemia - continue high dose statin.  5. Normocytic anemia and  macrocytosis - no reported bleeding. Hgb repeated this AM and improved. Follow.  6. Carotid bruits - outpatient duplex ordered per prior d/c summary. Do not see a date set for this so we'll have to make sure this gets done at d/c.  Signed, Ronie Spies PA-C Pager: [phone]   Patient seen and examined. Agree with assessment and plan. Chart reviewed from last admission. Feels better today.  Recent inferior MI changes on ECG without additional changes. Pt only on minimal meds due to low BP.  Would add ranolazine at 500 mg bid to regimen which should not reduce P or BP. Consider titrate imdur to 30 mg daily. High threshold to consider repeat cath presently.   Lennette Bihari, MD, [facility] 01/21/2015 11:06 AM

## 2015-01-22 ENCOUNTER — Other Ambulatory Visit: Payer: Self-pay | Admitting: Physician Assistant

## 2015-01-22 ENCOUNTER — Emergency Department (HOSPITAL_COMMUNITY)
Admission: EM | Admit: 2015-01-22 | Discharge: 2015-01-23 | Disposition: A | Discharge status: Home / Self Care | Payer: BLUE CROSS/BLUE SHIELD | Attending: Internal Medicine | Admitting: Internal Medicine

## 2015-01-22 ENCOUNTER — Encounter (HOSPITAL_COMMUNITY): Payer: Self-pay | Admitting: Emergency Medicine

## 2015-01-22 DIAGNOSIS — I2510 Atherosclerotic heart disease of native coronary artery without angina pectoris: Secondary | ICD-10-CM | POA: Diagnosis not present

## 2015-01-22 DIAGNOSIS — F419 Anxiety disorder, unspecified: Secondary | ICD-10-CM | POA: Insufficient documentation

## 2015-01-22 DIAGNOSIS — Z9889 Other specified postprocedural states: Secondary | ICD-10-CM | POA: Insufficient documentation

## 2015-01-22 DIAGNOSIS — Z87891 Personal history of nicotine dependence: Secondary | ICD-10-CM | POA: Insufficient documentation

## 2015-01-22 DIAGNOSIS — Z8619 Personal history of other infectious and parasitic diseases: Secondary | ICD-10-CM | POA: Insufficient documentation

## 2015-01-22 DIAGNOSIS — E785 Hyperlipidemia, unspecified: Secondary | ICD-10-CM | POA: Insufficient documentation

## 2015-01-22 DIAGNOSIS — I25118 Atherosclerotic heart disease of native coronary artery with other forms of angina pectoris: Secondary | ICD-10-CM

## 2015-01-22 DIAGNOSIS — Z79899 Other long term (current) drug therapy: Secondary | ICD-10-CM

## 2015-01-22 DIAGNOSIS — F329 Major depressive disorder, single episode, unspecified: Secondary | ICD-10-CM | POA: Insufficient documentation

## 2015-01-22 DIAGNOSIS — Z8669 Personal history of other diseases of the nervous system and sense organs: Secondary | ICD-10-CM

## 2015-01-22 DIAGNOSIS — Z872 Personal history of diseases of the skin and subcutaneous tissue: Secondary | ICD-10-CM | POA: Insufficient documentation

## 2015-01-22 DIAGNOSIS — Z7902 Long term (current) use of antithrombotics/antiplatelets: Secondary | ICD-10-CM

## 2015-01-22 DIAGNOSIS — Z7982 Long term (current) use of aspirin: Secondary | ICD-10-CM | POA: Insufficient documentation

## 2015-01-22 DIAGNOSIS — I10 Essential (primary) hypertension: Secondary | ICD-10-CM

## 2015-01-22 DIAGNOSIS — R0989 Other specified symptoms and signs involving the circulatory and respiratory systems: Secondary | ICD-10-CM

## 2015-01-22 DIAGNOSIS — Z7952 Long term (current) use of systemic steroids: Secondary | ICD-10-CM | POA: Insufficient documentation

## 2015-01-22 LAB — CBC
HEMATOCRIT: 28.9 % — AB (ref 36.0–46.0)
Hemoglobin: 9.5 g/dL — ABNORMAL LOW (ref 12.0–15.0)
MCH: 30.2 pg (ref 26.0–34.0)
MCHC: 32.9 g/dL (ref 30.0–36.0)
MCV: 91.7 fL (ref 78.0–100.0)
Platelets: 450 10*3/uL — ABNORMAL HIGH (ref 150–400)
RBC: 3.15 MIL/uL — ABNORMAL LOW (ref 3.87–5.11)
RDW: 12.6 % (ref 11.5–15.5)
WBC: 9.1 10*3/uL (ref 4.0–10.5)

## 2015-01-22 LAB — BASIC METABOLIC PANEL
ANION GAP: 8 (ref 5–15)
Anion gap: 7 (ref 5–15)
BUN: 14 mg/dL (ref 6–20)
BUN: 17 mg/dL (ref 6–20)
CALCIUM: 9.2 mg/dL (ref 8.9–10.3)
CO2: 19 mmol/L — ABNORMAL LOW (ref 22–32)
CO2: 18 mmol/L — ABNORMAL LOW (ref 22–32)
Calcium: 9.4 mg/dL (ref 8.9–10.3)
Chloride: 111 mmol/L (ref 101–111)
Chloride: 109 mmol/L (ref 101–111)
Creatinine, Ser: 1.01 mg/dL — ABNORMAL HIGH (ref 0.44–1.00)
Creatinine, Ser: 0.92 mg/dL (ref 0.44–1.00)
GFR calc Af Amer: >60 mL/min (ref >60)
GFR calc non Af Amer: 58 mL/min — ABNORMAL LOW (ref >60)
GLUCOSE: 117 mg/dL — AB (ref 65–99)
GLUCOSE: 109 mg/dL — AB (ref 65–99)
POTASSIUM: 4.2 mmol/L (ref 3.5–5.1)
Potassium: 3.9 mmol/L (ref 3.5–5.1)
SODIUM: 137 mmol/L (ref 135–145)
Sodium: 135 mmol/L (ref 135–145)

## 2015-01-22 LAB — I-STAT TROPONIN, ED: Troponin i, poc: 0.05 ng/mL (ref 0.00–0.08)

## 2015-01-22 MED ORDER — PANTOPRAZOLE SODIUM 40 MG PO TBEC: 40.0000 mg | DELAYED_RELEASE_TABLET | Freq: Every day | ORAL | Status: DC

## 2015-01-22 MED ORDER — ISOSORBIDE MONONITRATE ER 30 MG PO TB24: 30.0000 mg | ORAL_TABLET | Freq: Every day | ORAL | Status: DC

## 2015-01-22 MED ORDER — RANOLAZINE ER 500 MG PO TB12: 500.0000 mg | ORAL_TABLET | Freq: Two times a day (BID) | ORAL | Status: DC

## 2015-01-22 NOTE — Discharge Summary (Signed)
Discharge Summary   Patient ID: Laura Herrera,  MRN: [id number], DOB/AGE: [birth year] 63 y.o.  Admit date: 01/20/2015 Discharge date: 01/22/2015  Primary Care Provider: No primary care provider on file. Primary Cardiologist: Dr. Herbie Baltimore  Discharge Diagnoses Principal Problem:   Chest pain Active Problems:   Essential hypertension   Dyslipidemia   CAD (coronary artery disease)   Allergies Allergies  Allergen Reactions  . 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  . Shellfish-Derived Products Other (See Comments)    Crabs, lobster, crayfish  . Vancomycin     REACTION: severe rash/hives    Procedures: None  History of Present Illness  Laura Herrera is a 64 yo woman with PMH of hypertension, dyslipidemia and CAD with STEMI 01/11/15 of the RCA with mid/distal and ostial disease who discharged 01/17/15 with some minor occasional chest discomfort, who brought to [facility] by EMS for evaluation of worsening chest pain.   She had a emergent diagnostic catheterization 6/16 revealing severe ostial and proximal RCA disease with a total occlusion in the proximal to midsection of the artery. She was noted to have significant tortuosity of the right coronary artery. The LAD and left circumflex had nonobstructive disease. She underwent percutaneous intervention with drug eluting stent placement with the ostial RCA, proximal RCA, and proximal mid RCA. She also had distal disease which was successfully treated with balloon angioplasty only. Unfortunately, she continued to have chest discomfort and ongoing ST segment elevation following the procedure. She was taken back to the cardiac catheterization laboratory on the morning of June 7 with a relook catheterization revealed heavy thrombus throughout the proximal and mid RCA with TIMI 3 flow. The interventional team had difficulty engaging the ostium of the right  coronary artery secondary to extension of the previously  She had several episodes of chest discomfort after discharge. Her symptoms improved with SL NTG but it gave her a headache. She came to [facility] for further evaluation of her chest pain with radiation to her shoulder that occured while eating. Pain intensity was 4-5/10 but improved with SL NTG total of 4. Her ECG was called STEMI in the field but largely unchanged with known significant inferior Qs and some mild ST elevation. Her pain was improved in ED. POC trop was 0.13.  Hospital Course  She was admitted to telemetry early morning 1:30am of 6/16. Her pain improved overnight, just barely able to feel. Trop trend 0.20->0.20->0.15. Likely trending down from previous admission. PPI added since Hgb has gone down given recent anticoagulation/antiplatelets. Hemoglobin remained stable. She reported on melena or rectal bleeding. Her BP continued to remained soft side since recent hospitalization, limiting aggressive meds titration. Ranolazine 500 mg bid added to regimen. She felt much better on Renexa. She denied any further episode of chest pain. She ambulated in hallway without any chest pain. Tele showed NSR. Patient requested to update his brother who is a retired Insurance underwriter and Dr. Tresa Endo provided update on his voicemail. She is discharge stably with plan to titrate 1000 mg bid as outpatient. Also Increase imdur to 30 mg as bp tolerates.   Discharge Vitals Blood pressure 98/42, pulse 69, temperature 98.2 F (36.8 C), temperature source Oral, resp. rate 20, height  (1.702 m), weight 170 lb 11.2 oz (77.429 kg), SpO2 100 %.  Filed Weights   01/20/15 2358 01/21/15 0331 01/22/15 0400  Weight: 182 lb (82.555 kg) 170 lb 12.8 oz (77.474 kg) 170 lb  11.2 oz (77.429 kg)    CBC  Recent Labs  01/21/15 0800 01/22/15 0300  WBC 9.1 9.1  HGB 10.1* 9.5*  HCT 30.4* 28.9*  MCV 91.0 91.7  PLT 455* 450*   Basic Metabolic Panel  Recent Labs   78/67/67 2358 01/22/15 0300  NA 135 137  K 3.5 4.2  CL 109 111  CO2 17* 19*  GLUCOSE 107* 117*  BUN 15 14  CREATININE 0.94 1.01*  CALCIUM 8.8* 9.2   Cardiac Enzymes  Recent Labs  01/21/15 0346 01/21/15 0800 01/21/15 1447  TROPONINI 0.20* 0.20* 0.15*   Disposition  Pt is being discharged home today in good condition.  Follow-up Plans & Appointments  Follow-up Information    Follow up with Nicolasa Ducking, NP On 02/04/2015.   Specialties:  Nurse Practitioner, Cardiology, Radiology   Why:  @ 9:00   Contact information:   [address] [place] [address] [phone]       Follow up with [facility] On 02/04/2015.   Why:  @ 8:00 for carotid doppler   Contact information:   [address] [place] [address]       Discharge Instructions    Call MD for:  persistant dizziness or light-headedness    Complete by:  As directed      Diet - low sodium heart healthy    Complete by:  As directed      Increase activity slowly    Complete by:  As directed   Call office if dark stool or bright red blood in stool.           F/u Labs/Studies LFT in 6-8 weeks.   Discharge Medications    Medication List    TAKE these medications        ALPRAZolam 0.5 MG tablet  Commonly known as:  XANAX  Take 0.5 mg by mouth 3 (three) times daily.     aspirin 81 MG EC tablet  Take 1 tablet (81 mg total) by mouth daily.     atorvastatin 80 MG tablet  Commonly known as:  LIPITOR  Take 1 tablet (80 mg total) by mouth daily.     clobetasol cream 0.05 %  Commonly known as:  TEMOVATE  Apply 1 application topically 2 (two) times daily.     isosorbide mononitrate 30 MG 24 hr tablet  Commonly known as:  IMDUR  Take 0.5 tablets (15 mg total) by mouth daily.     metoprolol tartrate 25 MG tablet  Commonly known as:  LOPRESSOR  Take 0.5 tablets (12.5 mg total) by mouth 2 (two) times daily.     nitroGLYCERIN 0.4 MG SL  tablet  Commonly known as:  NITROSTAT  Place 1 tablet (0.4 mg total) under the tongue every 5 (five) minutes x 3 doses as needed for chest pain.     pantoprazole 40 MG tablet  Commonly known as:  PROTONIX  Take 1 tablet (40 mg total) by mouth daily at 12 noon.     ranolazine 500 MG 12 hr tablet  Commonly known as:  RANEXA  Take 1 tablet (500 mg total) by mouth 2 (two) times daily.     ticagrelor 90 MG Tabs tablet  Commonly known as:  BRILINTA  Take 1 tablet (90 mg total) by mouth 2 (two) times daily.        Duration of Discharge Encounter   Greater than 30 minutes including physician time.  Signed,  Grasiela Jonsson PA-C 01/22/2015, 2:07 PM

## 2015-01-22 NOTE — ED Notes (Signed)
Pt discharged today from this hospital for CP, states clot was found but could not be cathed.  Pt started on fibrolytics Wednesday.  Pt reports pain returned this evening, Left shoulder, radiating down Left arm.  Pt denies N/V, SOB, LOC, dizziness. Pt reports worst pain was 4/10, pt used 3 NTG at home, no change.  EMS reports 324 ASA with improvement.  NAD noted at this time, resp e/u.

## 2015-01-22 NOTE — Progress Notes (Signed)
Patient Name: Laura Herrera Date of Encounter: 01/22/2015  Principal Problem:   Chest pain Active Problems:   Essential hypertension   Dyslipidemia   CAD (coronary artery disease)  SUBJECTIVE  Denies chest pain, sob or palpitation. Walked in hallway without discomfort. Feels better.   CURRENT MEDS . aspirin EC  81 mg Oral Daily  . atorvastatin  80 mg Oral Daily  . clobetasol cream  1 application Topical BID  . isosorbide mononitrate  15 mg Oral Daily  . metoprolol tartrate  12.5 mg Oral BID  . pantoprazole  40 mg Oral Q1200  . ranolazine  500 mg Oral BID  . ticagrelor  90 mg Oral BID    OBJECTIVE  Filed Vitals:   01/21/15 2041 01/22/15 0036 01/22/15 0400 01/22/15 0750  BP: 101/46 111/45 109/43 95/40  Pulse: 78 71 67 68  Temp: 98.2 F (36.8 C) 98.6 F (37 C) 98.4 F (36.9 C) 98.7 F (37.1 C)  TempSrc: Oral Oral Oral Oral  Resp: 20 20 20    Height:      Weight:   170 lb 11.2 oz (77.429 kg)   SpO2: 100% 99% 98% 100%   No intake or output data in the 24 hours ending 01/22/15 0904 Filed Weights   01/20/15 2358 01/21/15 0331 01/22/15 0400  Weight: 182 lb (82.555 kg) 170 lb 12.8 oz (77.474 kg) 170 lb 11.2 oz (77.429 kg)    PHYSICAL EXAM  General: Pleasant, NAD. Neuro: Alert and oriented X 3. Moves all extremities spontaneously. Psych: Normal affect. HEENT:  Normal  Neck: Supple without bruits or JVD. Lungs:  Resp regular and unlabored, CTA. Heart: RRR no s3, s4, or murmurs. Abdomen: Soft, non-tender, non-distended, BS + x 4.  Extremities: No clubbing, cyanosis or edema. DP/PT/Radials 2+ and equal bilaterally.  Accessory Clinical Findings  CBC  Recent Labs  01/21/15 0800 01/22/15 0300  WBC 9.1 9.1  HGB 10.1* 9.5*  HCT 30.4* 28.9*  MCV 91.0 91.7  PLT 455* 450*   Basic Metabolic Panel  Recent Labs  01/20/15 2358 01/22/15 0300  NA 135 137  K 3.5 4.2  CL 109 111  CO2 17* 19*  GLUCOSE 107* 117*  BUN 15 14  CREATININE 0.94 1.01*  CALCIUM  8.8* 9.2   Cardiac Enzymes  Recent Labs  01/21/15 0346 01/21/15 0800 01/21/15 1447  TROPONINI 0.20* 0.20* 0.15*  TELE  NSR at rate of 60-70s  Radiology/Studies  Dg Chest Port 1 View  01/21/2015   CLINICAL DATA:  Acute onset of left-sided shoulder pain. Initial encounter.  EXAM: PORTABLE CHEST - 1 VIEW  COMPARISON:  Chest radiograph from 11/28/2012  FINDINGS: The lungs are well-aerated and clear. There is no evidence of focal opacification, pleural effusion or pneumothorax.  The cardiomediastinal silhouette is within normal limits. No acute osseous abnormalities are seen. A small hiatal hernia is again seen.  IMPRESSION: 1. No acute cardiopulmonary process seen. 2. Small hiatal hernia again noted.   Electronically Signed   By: Roanna Raider M.D.   On: 01/21/2015 00:12    ASSESSMENT AND PLAN   1. Chest pain - POC troponin 0.13, full troponin trending down 0.20->0.20->0.15. Ranexa 500mg  BID added yesterday, increase further as outpatient. No recurrent chest pain. Ambulated without chest pain. BP slow low. Consider titrate imdur to 30 mg daily as outpatient.   2. CAD - recent inferior STEMI last week with complex PCI/DES to ostial/prox/mRCA along with PTCA of distal RCA. Had residual chest pain after MI with  relook cath 01/12/15 revealing heavy thrombus throughout the prox & mRCA - interventional team had difficulty engaging the ostium of the RCA secondary to extension of the previously placed ostial stent into the aorta - medical therapy recommended, treated with tirofiban. EF 60-65% with mod AI last admission. CP improved on Ranexa and trop trending down.   3. HTN - BP has tendency to run on soft side, limiting aggressive med titration.  4. Dyslipidemia - continue high dose statin.  5. Normocytic anemia and macrocytosis - no reported bleeding. Hgb stable. Continue to monitor.  6. Carotid bruits - outpatient duplex ordered per prior d/c summary. Do not see a date set for this so we'll  have to make sure this gets done at d/c.  Lorelei Pont PA-C Pager [phone]   Patient seen and examined. Agree with assessment and plan. No recurrent chest pain; feels well. Tolerating ranexa 500 mg bid, can titrate to 1000 mg bid as outpatient. Increase imdur to 30 since tolerating 15 mg initiation. Soft L carotid bruit, plan for outpatient carotid dopplers.  I called pt's brother who is a retired Insurance underwriter and provided update on his voicemail. Ambulate this am; plan for dc if remain stable.   Lennette Bihari, MD, [facility] 01/22/2015 10:41 AM

## 2015-01-23 DIAGNOSIS — R079 Chest pain, unspecified: Secondary | ICD-10-CM | POA: Diagnosis not present

## 2015-01-23 LAB — I-STAT TROPONIN, ED: TROPONIN I, POC: 0.05 ng/mL (ref 0.00–0.08)

## 2015-01-23 NOTE — Discharge Instructions (Signed)
If you were given medicines take as directed.  If you are on coumadin or contraceptives realize their levels and effectiveness is altered by many different medicines.  If you have any reaction (rash, tongues swelling, other) to the medicines stop taking and see a physician.   Increase imdur to 30 mg daily as directed by cardiology.   If your blood pressure was elevated in the ER make sure you follow up for management with a primary doctor or return for chest pain, shortness of breath or stroke symptoms.  Please follow up as directed and return to the ER or see a physician for new or worsening symptoms.  Thank you. Filed Vitals:   01/22/15 2233 01/22/15 2300 01/22/15 2330 01/23/15 0000  BP: 110/45 116/55 118/41 113/50  Pulse: 64 65 66 61  Temp:      TempSrc:      Resp: 24 19 20 10   Height:      Weight:      SpO2: 100% 100% 100% 100%

## 2015-01-23 NOTE — Consult Note (Signed)
CARDIOLOGY CONSULT NOTE  Assessment and Plan:  *Chest pain:  Laura Herrera is a 64 year old female with history of CAD status post PCI to STEMI on 01/11/2015, hypertension, dyslipidemia who had a long as physician recently complicated by stent thrombosis. At the time of discharge early today, patient was sent out on metoprolol, Ranexa, Imdur for angina. After going home, patient had nonspecific shoulder pain versus mild chest pain which was refractory to medical therapy. Therefore she decided to come to the hospital. In the emergency department, she was ruled out for acute coroner syndrome with stable troponin levels and EKGs. After discussing her case with Dr. Herbie Baltimore, she was discharged from the hospital with higher dose of Imdur 30 mg in addition to Ranexa and metoprolol. She was asked to continue all her other antiplatelets agents and statins as prescribed. She has an appointment on June 30 with cardiologist. She'll follow-up with them as outpatient. She was asked to come back to the emergency department with worsening pain or other concerns.  Thank you very much for this very interesting consult.  Chief complaint: chest pain   HPI: Laura Herrera is a 64 year old female with history of CAD status post RCA STEMI on 01/11/2015, hypertension, dyslipidemia comes to the emergency department with left shoulder pain. She initially presented to the emergency department on 01/11/2015 with ST elevation myocardial infarction. She underwent PCI of the RCA. she subsequently had stent thrombosis. Afterwards, patient had long course with episodes of chest pains that are been medically managed. She was discharged on Imdur 15 mg, metoprolol 25 mg start rate twice a day and Ranexa 500 mg twice a day on 01/22/2015. Immediately after going home, she took her Xanax then woke up with a left-sided shoulder pain. This was similar to her previous anginal pain. She took 3 sublingual nitroglycerin's without any pain relief.  Therefore she decided to come to the emergency department where she was found to have normal EKGs and unchanged troponin levels. Upon my evaluation, patient denies any further shoulder pain. She feels comfortable going home. She also denies any problems with medication noncompliance. She also denies any symptoms of volume overload or heart failure at this time.  Past Medical History Past Medical History  Diagnosis Date  . Psoriasis   . MRSA infection     h/o MRSA infection in the right foot  . Anxiety   . Depression   . Insomnia   . PONV (postoperative nausea and vomiting)   . Coronary artery disease     a. 01/2015 Inf STEMI/PCI: RCA 80ost (2.75x12 Promus Premier DES), 100p (2.5x24 Promus Premier DES), 70p/m (2.5x24 Promus Premier DES), 90d (PTCA);  b. 01/2015 Relook Cath: LM nl, LAD 65m, D1 min irregs, LCX nl- L->R collats,  OM1 mod dzs, OM2 mild dizs, RCA 10ost, heavy thrombus p/m, 10d->Aggrastat;  c. 01/2015 Echo: EF 60-65%, no rwma, mod AI, PASP .  Marland Kitchen Hypertension   . Dyslipidemia     Allergies: Allergies  Allergen Reactions  . 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  . Shellfish-Derived Products Other (See Comments)    Crabs, lobster, crayfish  . Vancomycin     REACTION: severe rash/hives    Social History History   Social History  . Marital Status: Widowed    Spouse Name: N/A  . Number of Children: N/A  . Years of Education: N/A   Occupational History  . unemployed    Social History Main  Topics  . Smoking status: Former Smoker    Types: Cigarettes  . Smokeless tobacco: Not on file  . Alcohol Use: 8.4 oz/week    14 Glasses of wine per week     Comment: 2 glass of wine a night   . Drug Use: No  . Sexual Activity: Not Currently    Birth Control/ Protection: Post-menopausal   Other Topics Concern  . Not on file   Social History Narrative    Family History Family History   Problem Relation Age of Onset  . Peripheral vascular disease Mother   . Hyperlipidemia Mother   . Alzheimer's disease Father   . Mental illness Sister   . Heart Problems Sister   . Schizophrenia Sister   . Hypotension Sister   . Hyperlipidemia Brother   . Heart disease Maternal Grandmother     Physical Exam Filed Vitals:   01/23/15 0148  BP:  114/56   Pulse:  70   Temp:   Resp: 14     Gen: Comfortable appearing in no acute distress HEENT: Extra ocular motions intact, moist mucous membranes Neck: Supple, no JVD, no carotid bruits CV: Regular rate rhythm, normal S1, normal S2, +2 pulses in bilateral upper extremities Pulm: Clear to auscultation bilaterally Abdomen: Soft, nontender, nondistended Ext: No cyanosis clubbing or edema  Labs:  Results for orders placed or performed during the hospital encounter of 01/22/15 (from the past 24 hour(s))  Basic metabolic panel     Status: Abnormal   Collection Time: 01/22/15 10:31 PM  Result Value Ref Range   Sodium 135 135 - 145 mmol/L   Potassium 3.9 3.5 - 5.1 mmol/L   Chloride 109 101 - 111 mmol/L   CO2 18 (L) 22 - 32 mmol/L   Glucose, Bld 109 (H) 65 - 99 mg/dL   BUN 17 6 - 20 mg/dL   Creatinine, Ser 1.61 0.44 - 1.00 mg/dL   Calcium 9.4 8.9 - 09.6 mg/dL   GFR calc non Af Amer >60 >60 mL/min   GFR calc Af Amer >60 >60 mL/min   Anion gap 8 5 - 15  I-stat troponin, ED  (not at [address] [facility], [facility])     Status: None   Collection Time: 01/22/15 10:36 PM  Result Value Ref Range   Troponin i, poc 0.05 0.00 - 0.08 ng/mL   Comment 3          I-stat troponin, ED     Status: None   Collection Time: 01/23/15 12:17 AM  Result Value Ref Range   Troponin i, poc 0.05 0.00 - 0.08 ng/mL   Comment 3

## 2015-01-23 NOTE — ED Provider Notes (Signed)
CSN: [id number]     Arrival date & time 01/22/15  2130 History   First MD Initiated Contact with Patient 01/22/15 2141     Chief Complaint  Patient presents with  . Chest Pain     (Consider location/radiation/quality/duration/timing/severity/associated sxs/prior Treatment) HPI Comments: 64 year old female with history of recent inferior myocardial infarction, high blood pressure, lipids, CAD presents with recurrent left shoulder pain similar to previous heart attack. This is patient's third episode of this recently. Patient had stents placed and then had to go act for another catheterization due to worsening pain. Patient currently being medically managed. This evening 2 hours  prior to arrival patient had similar symptoms that have since almost resolved. These included left anterior shoulder discomfort. No diaphoresis, no chest pain anteriorly no shortness of breath. Patient currently feels well otherwise Past smoker.  Patient is a 64 y.o. female presenting with chest pain. The history is provided by the patient.  Chest Pain Associated symptoms: no abdominal pain, no back pain, no fever, no headache, no shortness of breath and not vomiting     Past Medical History  Diagnosis Date  . Psoriasis   . MRSA infection     h/o MRSA infection in the right foot  . Anxiety   . Depression   . Insomnia   . PONV (postoperative nausea and vomiting)   . Coronary artery disease     a. 01/2015 Inf STEMI/PCI: RCA 80ost (2.75x12 Promus Premier DES), 100p (2.5x24 Promus Premier DES), 70p/m (2.5x24 Promus Premier DES), 90d (PTCA);  b. 01/2015 Relook Cath: LM nl, LAD 46m, D1 min irregs, LCX nl- L->R collats,  OM1 mod dzs, OM2 mild dizs, RCA 10ost, heavy thrombus p/m, 10d->Aggrastat;  c. 01/2015 Echo: EF 60-65%, no rwma, mod AI, PASP .  Marland Kitchen Hypertension   . Dyslipidemia    Past Surgical History  Procedure Laterality Date  . Cardiac catheterization    . Cardiac catheterization N/A 01/11/2015    Procedure:  Left Heart Cath and Coronary Angiography;  Surgeon: Marykay Lex, MD;  Location: [facility] INVASIVE CV LAB;  Service: Cardiovascular;  Laterality: N/A;  . Cardiac catheterization  01/11/2015    Procedure: Coronary Stent Intervention;  Surgeon: Marykay Lex, MD;  Location: [facility] INVASIVE CV LAB;  Service: Cardiovascular;;  . Cardiac catheterization  01/11/2015    Procedure: Coronary Balloon Angioplasty;  Surgeon: Marykay Lex, MD;  Location: [facility] INVASIVE CV LAB;  Service: Cardiovascular;;  . Cardiac catheterization N/A 01/12/2015    Procedure: Left Heart Cath and Coronary Angiography;  Surgeon: Lyn Records, MD;  Location: [facility] INVASIVE CV LAB;  Service: Cardiovascular;  Laterality: N/A;  . Angioplasty  01/12/2015    Procedure: Angioplasty;  Surgeon: Lyn Records, MD;  Location: [facility] INVASIVE CV LAB;  Service: Cardiovascular;;   Family History  Problem Relation Age of Onset  . Peripheral vascular disease Mother   . Hyperlipidemia Mother   . Alzheimer's disease Father   . Mental illness Sister   . Heart Problems Sister   . Schizophrenia Sister   . Hypotension Sister   . Hyperlipidemia Brother   . Heart disease Maternal Grandmother    History  Substance Use Topics  . Smoking status: Former Smoker    Types: Cigarettes  . Smokeless tobacco: Not on file  . Alcohol Use: 8.4 oz/week    14 Glasses of wine per week     Comment: 2 glass of wine a night    OB History    No data  available     Review of Systems  Constitutional: Negative for fever and chills.  HENT: Negative for congestion.   Eyes: Negative for visual disturbance.  Respiratory: Negative for shortness of breath.   Gastrointestinal: Negative for vomiting and abdominal pain.  Genitourinary: Negative for dysuria and flank pain.  Musculoskeletal: Negative for back pain, neck pain and neck stiffness.  Skin: Negative for rash.  Neurological: Negative for light-headedness and headaches.      Allergies  Cholinesterase inhibitors;  Shellfish-derived products; and Vancomycin  Home Medications   Prior to Admission medications   Medication Sig Start Date End Date Taking? Authorizing Provider  ALPRAZolam Prudy Feeler) 0.5 MG tablet Take 0.5 mg by mouth 3 (three) times daily.   Yes Historical Provider, MD  aspirin EC 81 MG EC tablet Take 1 tablet (81 mg total) by mouth daily. 01/17/15  Yes Ok Anis, NP  atorvastatin (LIPITOR) 80 MG tablet Take 1 tablet (80 mg total) by mouth daily. 01/17/15  Yes Ok Anis, NP  isosorbide mononitrate (IMDUR) 30 MG 24 hr tablet Take 0.5 tablets (15 mg total) by mouth daily. 01/17/15  Yes Ok Anis, NP  metoprolol tartrate (LOPRESSOR) 25 MG tablet Take 0.5 tablets (12.5 mg total) by mouth 2 (two) times daily. 01/17/15  Yes Ok Anis, NP  nitroGLYCERIN (NITROSTAT) 0.4 MG SL tablet Place 1 tablet (0.4 mg total) under the tongue every 5 (five) minutes x 3 doses as needed for chest pain. 01/17/15  Yes Ok Anis, NP  pantoprazole (PROTONIX) 40 MG tablet Take 1 tablet (40 mg total) by mouth daily at 12 noon. 01/22/15  Yes Bhavinkumar Bhagat, PA  ranolazine (RANEXA) 500 MG 12 hr tablet Take 1 tablet (500 mg total) by mouth 2 (two) times daily. 01/22/15  Yes Bhavinkumar Bhagat, PA  ticagrelor (BRILINTA) 90 MG TABS tablet Take 1 tablet (90 mg total) by mouth 2 (two) times daily. 01/17/15  Yes Ok Anis, NP  clobetasol cream (TEMOVATE) 0.05 % Apply 1 application topically 2 (two) times daily.    Historical Provider, MD   BP 113/50 mmHg  Pulse 61  Temp(Src) 98.5 F (36.9 C) (Oral)  Resp 10  Ht  (1.702 m)  Wt 170 lb (77.111 kg)  BMI 26.62 kg/m2  SpO2 100% Physical Exam  Constitutional: She is oriented to person, place, and time. She appears well-developed and well-nourished.  HENT:  Head: Normocephalic and atraumatic.  Eyes: Conjunctivae are normal. Right eye exhibits no discharge. Left eye exhibits no discharge.  Neck: Normal range of motion. Neck  supple. No tracheal deviation present.  Cardiovascular: Normal rate and regular rhythm.   Pulmonary/Chest: Effort normal and breath sounds normal.  Abdominal: Soft. She exhibits no distension. There is no tenderness. There is no guarding.  Musculoskeletal: She exhibits no edema.  Neurological: She is alert and oriented to person, place, and time.  Skin: Skin is warm. No rash noted.  Psychiatric: She has a normal mood and affect.  Nursing note and vitals reviewed.   ED Course  Procedures (including critical care time) Labs Review Labs Reviewed  BASIC METABOLIC PANEL - Abnormal; Notable for the following:    CO2 18 (*)    Glucose, Bld 109 (*)    All other components within normal limits  I-STAT TROPOININ, ED  I-STAT TROPOININ, ED    Imaging Review No results found.   EKG Interpretation   Date/Time:  Friday January 22 2015 21:36:09 EDT Ventricular Rate:  63 PR Interval:  153 QRS Duration: 86 QT Interval:  457 QTC Calculation: 468 R Axis:   -32 Text Interpretation:  Sinus rhythm Inferoposterior infarct, age  indeterminate similar previous Confirmed by Shunna Mikaelian  MD, Gedalia Mcmillon ([id number]) on  01/22/2015 9:45:33 PM      MDM   Final diagnoses:  Coronary artery disease with other forms of angina pectoris   Patient with recent heart attack and stent placement presents with recurrent similar symptoms. Patient currently on recheck has minimal symptoms. EKG similar to previous. Troponin is negative. Repeat cardiology for further recommendations for either observation versus continued medical management close outpatient follow-up. Updated the patient.  Filed Vitals:   01/22/15 2233 01/22/15 2300 01/22/15 2330 01/23/15 0000  BP: 110/45 116/55 118/41 113/50  Pulse: 64 65 66 61  Temp:      TempSrc:      Resp: Height:      Weight:      SpO2: 100% 100% 100% 100%      Blane Ohara, MD 01/23/15 0122

## 2015-01-25 ENCOUNTER — Encounter: Payer: BLUE CROSS/BLUE SHIELD | Admitting: Nurse Practitioner

## 2015-02-01 ENCOUNTER — Telehealth: Payer: Self-pay | Admitting: Nurse Practitioner

## 2015-02-01 NOTE — Telephone Encounter (Signed)
Spoke with Laura Herrera and she states that she has been having SOB since 01/11/15 when she was d/c'ed from hospital. Laura Herrera states that SOB only occurs when exerting and quickly resolved when she sits down and relaxes.  No SOB noted while speaking with Laura Herrera. Laura Herrera denies CP, swelling,lightheadedness, or dizziness. Laura Herrera states BP last night was 117/59 HE 68, BP today 119/59, HR 66. Laura Herrera states other than the occasional SOB, she feels fine and wouldn't have called if her brother, who is a Insurance underwriter, hadn't told her to call. Laura Herrera has appt 02/05/15 with Norma Fredrickson, NP and a Carotid doppler the same day. Informed Laura Herrera that I would route this information to Dr. Herbie Baltimore for review and advisement.

## 2015-02-01 NOTE — Telephone Encounter (Signed)
New message     Pt c/o Shortness Of Breath: STAT if SOB developed within the last 24 hours or pt is noticeably SOB on the phone  1. Are you currently SOB (can you hear that pt is SOB on the phone)? No  2. How long have you been experiencing SOB? Since June 6  3. Are you SOB when sitting or when up moving around? Ok when sitting, has SOB when moving around sometime  4. Are you currently experiencing any other symptoms?No    Pt state she has SOB when bending over; b/p earlier today was 118/59

## 2015-02-01 NOTE — Telephone Encounter (Signed)
This is Dr.Harding's pt. Routed to [facility] triage

## 2015-02-03 MED ORDER — ISOSORBIDE MONONITRATE ER 30 MG PO TB24: 30.0000 mg | ORAL_TABLET | Freq: Every day | ORAL | Status: DC

## 2015-02-03 MED ORDER — FUROSEMIDE 20 MG PO TABS: 20.0000 mg | ORAL_TABLET | Freq: Every day | ORAL | Status: DC

## 2015-02-03 NOTE — Telephone Encounter (Signed)
Spoke to patient, Information given to patient,she voiced understanding Medication was change on  Current medication list. Increase isosorbide mn 30 mg daily.  furosemide 20 mg daily , but today take 40 mg.  Patient states she has difficulty sleeping so she takes morning medication at 1 pm and evening medications at 1 am.  She states she does not go to bed until 4 am in the morning.  Keep appointment on 02/05/15 with Norma Fredrickson np. patient she aware and she knew the correct address.

## 2015-02-03 NOTE — Telephone Encounter (Signed)
I'm not exactly sure what to make of her symptoms. She's not having any chest pain which would make me think that this is probably more related to some diastolic dysfunction post MI. If she is having some dyspnea does not associated with exertion that may be related to Brilinta.  For now as have her increase her Imdur to a full 30 mg tablet instead of half a tablet. Add Lasix 40 mg 1 day then 20 mg daily until seen in follow-up.  Marykay Lex, MD

## 2015-02-04 ENCOUNTER — Encounter (HOSPITAL_COMMUNITY): Payer: BLUE CROSS/BLUE SHIELD

## 2015-02-04 ENCOUNTER — Telehealth: Payer: Self-pay | Admitting: Cardiology

## 2015-02-04 ENCOUNTER — Encounter: Payer: BLUE CROSS/BLUE SHIELD | Admitting: Nurse Practitioner

## 2015-02-04 NOTE — Telephone Encounter (Signed)
Pt concerned d/t med changes. Imdur increased and lasix started. She took 40mg  lasix yesterday. Pt felt dizzy yesterday evening, "sparkly headed" upon standing/position changes. Reports still feeling this way this AM. BP checked was 129/78. Advised OK to take meds as directed - take caution w/ positional changes. Keep f/u appt on 7/1 w/ nurse practitioner. Pt voiced understand, agreement w/ plan.

## 2015-02-04 NOTE — Telephone Encounter (Signed)
Please call,question about her Imdur and Furosemide.Pt says she feels much worse,since taking the medicine.

## 2015-02-05 ENCOUNTER — Ambulatory Visit (HOSPITAL_COMMUNITY): Payer: BLUE CROSS/BLUE SHIELD | Attending: Internal Medicine

## 2015-02-05 ENCOUNTER — Encounter: Payer: Self-pay | Admitting: Nurse Practitioner

## 2015-02-05 ENCOUNTER — Telehealth: Payer: Self-pay

## 2015-02-05 ENCOUNTER — Ambulatory Visit (INDEPENDENT_AMBULATORY_CARE_PROVIDER_SITE_OTHER): Payer: BLUE CROSS/BLUE SHIELD | Admitting: Nurse Practitioner

## 2015-02-05 VITALS — BP 120/58 | HR 87 | Ht 67.0 in | Wt 168.0 lb

## 2015-02-05 DIAGNOSIS — Z955 Presence of coronary angioplasty implant and graft: Secondary | ICD-10-CM | POA: Diagnosis not present

## 2015-02-05 DIAGNOSIS — I213 ST elevation (STEMI) myocardial infarction of unspecified site: Secondary | ICD-10-CM

## 2015-02-05 DIAGNOSIS — I10 Essential (primary) hypertension: Secondary | ICD-10-CM

## 2015-02-05 DIAGNOSIS — E785 Hyperlipidemia, unspecified: Secondary | ICD-10-CM | POA: Diagnosis not present

## 2015-02-05 DIAGNOSIS — R0989 Other specified symptoms and signs involving the circulatory and respiratory systems: Secondary | ICD-10-CM | POA: Diagnosis not present

## 2015-02-05 DIAGNOSIS — I6523 Occlusion and stenosis of bilateral carotid arteries: Secondary | ICD-10-CM | POA: Insufficient documentation

## 2015-02-05 LAB — BASIC METABOLIC PANEL
BUN: 16 mg/dL (ref 6–23)
CO2: 25 mEq/L (ref 19–32)
Calcium: 10.4 mg/dL (ref 8.4–10.5)
Chloride: 99 mEq/L (ref 96–112)
Creatinine, Ser: 1.51 mg/dL — ABNORMAL HIGH (ref 0.40–1.20)
GFR: 36.97 mL/min — ABNORMAL LOW (ref >60.00)
Glucose, Bld: 114 mg/dL — ABNORMAL HIGH (ref 70–99)
Potassium: 4 mEq/L (ref 3.5–5.1)
Sodium: 134 mEq/L — ABNORMAL LOW (ref 135–145)

## 2015-02-05 LAB — CBC
HCT: 34.8 % — ABNORMAL LOW (ref 36.0–46.0)
Hemoglobin: 11.6 g/dL — ABNORMAL LOW (ref 12.0–15.0)
MCHC: 33.5 g/dL (ref 30.0–36.0)
MCV: 88.8 fl (ref 78.0–100.0)
Platelets: 475 10*3/uL — ABNORMAL HIGH (ref 150.0–400.0)
RBC: 3.92 Mil/uL (ref 3.87–5.11)
RDW: 12.8 % (ref 11.5–15.5)
WBC: 10.5 10*3/uL (ref 4.0–10.5)

## 2015-02-05 LAB — BRAIN NATRIURETIC PEPTIDE: Pro B Natriuretic peptide (BNP): 121 pg/mL — ABNORMAL HIGH (ref 0.0–100.0)

## 2015-02-05 LAB — HEPATIC FUNCTION PANEL
ALT: 17 U/L (ref 0–35)
AST: 21 U/L (ref 0–37)
Albumin: 4.3 g/dL (ref 3.5–5.2)
Alkaline Phosphatase: 101 U/L (ref 39–117)
Bilirubin, Direct: 0.1 mg/dL (ref 0.0–0.3)
Total Bilirubin: 0.5 mg/dL (ref 0.2–1.2)
Total Protein: 7.9 g/dL (ref 6.0–8.3)

## 2015-02-05 MED ORDER — FUROSEMIDE 20 MG PO TABS: 20.0000 mg | ORAL_TABLET | Freq: Every day | ORAL | Status: DC | PRN

## 2015-02-05 MED ORDER — PRASUGREL HCL 10 MG PO TABS: 10.0000 mg | ORAL_TABLET | Freq: Every day | ORAL | Status: DC

## 2015-02-05 NOTE — Addendum Note (Signed)
Addended by: Rosalio Macadamia on: 02/05/2015 03:28 PM   Modules accepted: Orders

## 2015-02-05 NOTE — Progress Notes (Signed)
CARDIOLOGY OFFICE NOTE  Date:  02/05/2015    Laura Herrera Date of Birth: Sep 11, 1950 Medical Record #[id number]  PCP:  No primary care provider on file.  Cardiologist:  Herbie Baltimore  Chief Complaint  Patient presents with  . FU post STEMI    TOC - seen for Dr. Herbie Baltimore.     History of Present Illness: Laura Herrera is a 64 y.o. female who presents today for a post hospital visit - was to be a TOC however she has already returned to the ER. She has a PMH of hypertension, dyslipidemia and CAD with STEMI 01/11/15 of the RCA with mid/distal and ostial disease.   She had a emergent diagnostic catheterization 6/16 revealing severe ostial and proximal RCA disease with a total occlusion in the proximal to midsection of the artery. She was noted to have significant tortuosity of the right coronary artery. The LAD and left circumflex had nonobstructive disease. She underwent percutaneous intervention with drug eluting stent placement with the ostial RCA, proximal RCA, and proximal mid RCA. She also had distal disease which was successfully treated with balloon angioplasty only. Unfortunately, she continued to have chest discomfort and ongoing ST segment elevation following the procedure. She was taken back to the cardiac catheterization laboratory on the morning of June 7 with a relook catheterization revealed heavy thrombus throughout the proximal and mid RCA with TIMI 3 flow. The interventional team had difficulty engaging the ostium of the right coronary artery secondary to extension of the previously  She has had recurrent shoulder/chest pain since her initial event. She has been back to the ER.  She has had multiple phone calls since her last discharge. She now feels that her shoulder pain may have just been in fact shoulder pain - she had been moving lots of heavy boxes right before her MI. She has been short of breath - has been placed on Lasix and increased doses of nitrates.  Comes in  today. Here with her "soul sister". [name] is a wreck. She is very tearful. She is dizzy. She is short of breath. She is scare. She has had no more chest pain. She is taking her medicines. She is doing just basic ADL's no real walking. She is dizzy with taking the Lasix - this has happened in the past.  She has had lots of stress with her husband committing suicide several years ago, has lost both her parents and for some reason is on the outs with her daughter.    Past Medical History  Diagnosis Date  . Psoriasis   . MRSA infection     h/o MRSA infection in the right foot  . Anxiety   . Depression   . Insomnia   . PONV (postoperative nausea and vomiting)   . Coronary artery disease     a. 01/2015 Inf STEMI/PCI: RCA 80ost (2.75x12 Promus Premier DES), 100p (2.5x24 Promus Premier DES), 70p/m (2.5x24 Promus Premier DES), 90d (PTCA);  b. 01/2015 Relook Cath: LM nl, LAD 57m, D1 min irregs, LCX nl- L->R collats,  OM1 mod dzs, OM2 mild dizs, RCA 10ost, heavy thrombus p/m, 10d->Aggrastat;  c. 01/2015 Echo: EF 60-65%, no rwma, mod AI, PASP .  Marland Kitchen Hypertension   . Dyslipidemia     Past Surgical History  Procedure Laterality Date  . Cardiac catheterization    . Cardiac catheterization N/A 01/11/2015    Procedure: Left Heart Cath and Coronary Angiography;  Surgeon: Marykay Lex, MD;  Location: [facility] INVASIVE  CV LAB;  Service: Cardiovascular;  Laterality: N/A;  . Cardiac catheterization  01/11/2015    Procedure: Coronary Stent Intervention;  Surgeon: Marykay Lex, MD;  Location: [facility] INVASIVE CV LAB;  Service: Cardiovascular;;  . Cardiac catheterization  01/11/2015    Procedure: Coronary Balloon Angioplasty;  Surgeon: Marykay Lex, MD;  Location: [facility] INVASIVE CV LAB;  Service: Cardiovascular;;  . Cardiac catheterization N/A 01/12/2015    Procedure: Left Heart Cath and Coronary Angiography;  Surgeon: Lyn Records, MD;  Location: [facility] INVASIVE CV LAB;  Service: Cardiovascular;  Laterality: N/A;  .  Angioplasty  01/12/2015    Procedure: Angioplasty;  Surgeon: Lyn Records, MD;  Location: [facility] INVASIVE CV LAB;  Service: Cardiovascular;;     Medications: Current Outpatient Prescriptions  Medication Sig Dispense Refill  . ALPRAZolam (XANAX) 0.5 MG tablet Take 0.5 mg by mouth 3 (three) times daily.    Marland Kitchen aspirin EC 81 MG EC tablet Take 1 tablet (81 mg total) by mouth daily.    Marland Kitchen atorvastatin (LIPITOR) 80 MG tablet Take 1 tablet (80 mg total) by mouth daily. 30 tablet 6  . clobetasol cream (TEMOVATE) 0.05 % Apply 1 application topically 2 (two) times daily.    . furosemide (LASIX) 20 MG tablet Take 1 tablet (20 mg total) by mouth daily as needed. 30 tablet 6  . isosorbide mononitrate (IMDUR) 30 MG 24 hr tablet Take 1 tablet (30 mg total) by mouth daily. 30 tablet 3  . metoprolol tartrate (LOPRESSOR) 25 MG tablet Take 0.5 tablets (12.5 mg total) by mouth 2 (two) times daily. 30 tablet 6  . nitroGLYCERIN (NITROSTAT) 0.4 MG SL tablet Place 1 tablet (0.4 mg total) under the tongue every 5 (five) minutes x 3 doses as needed for chest pain. 25 tablet 3  . pantoprazole (PROTONIX) 40 MG tablet Take 1 tablet (40 mg total) by mouth daily at 12 noon. 30 tablet 1  . ranolazine (RANEXA) 500 MG 12 hr tablet Take 1 tablet (500 mg total) by mouth 2 (two) times daily. 30 tablet 3  . prasugrel (EFFIENT) 10 MG TABS tablet Take 1 tablet (10 mg total) by mouth daily. 90 tablet 3   No current facility-administered medications for this visit.    Allergies: Allergies  Allergen Reactions  . 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  . Shellfish-Derived Products Other (See Comments)    Crabs, lobster, crayfish  . Vancomycin     REACTION: severe rash/hives    Social History: The patient  reports that she has quit smoking. Her smoking use included Cigarettes. She does not have any smokeless tobacco history on file. She reports  that she drinks about 8.4 oz of alcohol per week. She reports that she does not use illicit drugs.   Family History: The patient's family history includes Alzheimer's disease in her father; Heart Problems in her sister; Heart disease in her maternal grandmother; Hyperlipidemia in her brother and mother; Hypotension in her sister; Mental illness in her sister; Peripheral vascular disease in her mother; Schizophrenia in her sister.   Review of Systems: Please see the history of present illness.   Otherwise, the review of systems is positive for none.   All other systems are reviewed and negative.   Physical Exam: VS:  BP 120/58 mmHg  Pulse 87  Ht 5\' 7"  (1.702 m)  Wt 168 lb (76.204 kg)  BMI 26.31 kg/m2 .  BMI Body mass index  is 26.31 kg/(m^2).  Wt Readings from Last 3 Encounters:  02/05/15 168 lb (76.204 kg)  01/22/15 170 lb (77.111 kg)  01/22/15 170 lb 11.2 oz (77.429 kg)    General: Very tearful. Crying but in no acute distress.  HEENT: Normal. Neck: Supple, no JVD, carotid bruits, or masses noted.  Cardiac: Regular rate and rhythm. No murmurs, rubs, or gallops. No edema.  Respiratory:  Lungs are clear to auscultation bilaterally with normal work of breathing.  GI: Soft and nontender.  MS: No deformity or atrophy. Gait and ROM intact. Skin: Warm and dry. Color is normal.  Neuro:  Strength and sensation are intact and no gross focal deficits noted.  Psych: Alert, appropriate and with normal affect.   LABORATORY DATA:  EKG:  EKG is ordered today. This demonstrates NSR with inferior and lateral T wave changes.  Lab Results  Component Value Date   WBC 9.1 01/22/2015   HGB 9.5* 01/22/2015   HCT 28.9* 01/22/2015   PLT 450* 01/22/2015   GLUCOSE 109* 01/22/2015   CHOL 147 01/12/2015   TRIG 69 01/12/2015   HDL 58 01/12/2015   LDLCALC 75 01/12/2015   ALT 35 01/11/2015   AST 90* 01/11/2015   NA 135 01/22/2015   K 3.9 01/22/2015   CL 109 01/22/2015   CREATININE 0.92 01/22/2015    BUN 17 01/22/2015   CO2 18* 01/22/2015   TSH 3.035 01/11/2015   INR 5.39* 01/11/2015   HGBA1C 5.7* 01/11/2015    BNP (last 3 results)  Recent Labs  01/11/15 2258 01/20/15 2358  BNP 289.6* 173.4*    ProBNP (last 3 results) No results for input(s): PROBNP in the last 8760 hours.   Other Studies Reviewed Today:  Echo Study Conclusions from June 2016  - Left ventricle: The cavity size was normal. Wall thickness was increased in a pattern of mild LVH. Systolic function was normal. The estimated ejection fraction was in the range of 60% to 65%. Wall motion was normal; there were no regional wall motion abnormalities. Left ventricular diastolic function parameters were normal. - Aortic valve: There was moderate regurgitation. - Mitral valve: Calcified annulus. - Pulmonary arteries: PA peak pressure: 37 mm Hg (S).  Conclusion    1. Ost RCA lesion, 10% stenosed. A drug-eluting stent was placed. 2. Mid RCA stent has protrusion of thrombus/tissue into the lumen. TIMI grade III flow is noted. 3. Distal RCA emboli involving a PL branch   The ostial and mid RCA stents are patent. The ostial stent which extends into the ascending aorta prevents coaxial selective engagement.  Intraluminal filling defect within the mid RCA stent.  Evidence of distal PL branch embolization  Ongoing chest pain despite TIMI grade 3 flow suggests distal embolization as the cause of her current symptoms.  Inability to freely steer the guidewire beyond the proximal third of the mid stent. Balloon angioplasty was not attempted due to tenuous CAD catheter support and high likelihood of guidewire course beyond/outside stent struts.   RECOMMENDATIONS:   I don't believe there is a further PCI option. Medical therapy is best option.  Prolonged anticoagulation as tolerated with Aggrastat for up to least 48 hours.  If recurrent angina, consideration of emergency bypass surgery however this  would not provide survival benefit.  Prognosis for long-term patency of the RCA is guarded.    Conclusion    4. Extremely tortuous coronary arteries with severe single-vessel disease involving the ostium and mid RCA as follows. 5. Prox RCA lesion, 100%  stenosed. Prox RCA to Mid RCA lesion, 70% stenosed. A single Promus Premier DES (drug-eluting stent) was placed covering both lesions. There is a 0% residual stenosis post intervention. 6. Ost RCA lesion, 80% stenosed. A second Promus Premier drug-eluting stent was placed. There is a 10% residual stenosis post intervention. 7. Dist RCA lesion, 90% stenosed. Balloon angioplasty/PTCA was performed. There is a 10% residual stenosis post intervention. 8. Minimal disease in the left coronary system.  Successful, difficult/complex multisite PCI in the RCA involving an ostial 80% lesion followed by 100% heavily thrombotic occlusion with tandem distal 70% stenosis. The ostial and tandem proximal-mid vessel lesions or each treated with DES stents. This was in conjunction also a 90% distal RCA/right posterior AV groove lesion is also treated with PTCA.  This is a very complex procedure requiring the use of several guide catheters, buddy wire technique, adjunctive antiplatelet agents and multisite PTCA with multisite PCI and included ostial lesion. Over 2-1/2 hours was spent on this case.   Recommendations:  Admit to CCU with standard post radial PCI care and TR band removal.  Dual antiplatelet therapy for minimum one year. Continue at least Brilinta or Plavix lifelong  Aggressive risk factor modification with high-dose statin and aggressive blood pressure control including beta blocker (likely restart home dose of amlodipine)  As no LV gram was performed, we have ordered an echocardiogram for tomorrow to assess EF and filling pressures.  If she remains hemodynamic stable, would anticipate possible fast-track discharge. This also is dependent upon  her EF.   Marykay Lex, M.D., M.S. Interventional Cardiologist     Assessment/Plan: 1. STEMI with extensive PCI of the RCA - ostial stent extending into the aorta. She is on DAPT with Brilinta. I suspect this is what is causing her dyspnea. EF is normal. Diastolic parameters were normal. Will stop Brilinta - start Effient. Stop Lasix and just change to prn. Checking labs as well. She will need close follow up.   2. HTN - BP is ok on current regimen  3. Situational stress - I hope this will improve with time.   4. HLD - on statin - LFTS were up some - will recheck today.   Current medicines are reviewed with the patient today.  The patient does not have concerns regarding medicines other than what has been noted above.  The following changes have been made:  See above.  Labs/ tests ordered today include:    Orders Placed This Encounter  Procedures  . Basic metabolic panel  . CBC  . Hepatic function panel  . EKG 12-Lead     Disposition:   FU with me on Tuesday at 3:30.   Patient is agreeable to this plan and will call if any problems develop in the interim.   Signed: Rosalio Macadamia, RN, ANP-C 02/05/2015 3:24 PM  [facility] [address] [place], [place]  [address] Phone: [phone] Fax: [phone]

## 2015-02-05 NOTE — Telephone Encounter (Signed)
Lab results from 02/05/15 given to Dr. Myrtis Ser.  Called patient and told her not to take as needed Lasix  Placed order for BMET with OV on Tuesday 3:30pm

## 2015-02-05 NOTE — Patient Instructions (Addendum)
We will be checking the following labs today - BMET, CBC and BNP   Medication Instructions:    Continue with your current medicines but  I am changing the Lasix to just as needed  I am stopping the Brilinta  I am starting Effient 10 mg each day - start tonight and then one each day    Testing/Procedures To Be Arranged:  N/A  Follow-Up:   See me Tuesday, at 3:30    Other Special Instructions:   N/A  Call the [facility] office at [phone] if you have any questions, problems or concerns.

## 2015-02-09 ENCOUNTER — Encounter (HOSPITAL_COMMUNITY): Payer: BLUE CROSS/BLUE SHIELD

## 2015-02-09 ENCOUNTER — Other Ambulatory Visit (INDEPENDENT_AMBULATORY_CARE_PROVIDER_SITE_OTHER): Payer: BLUE CROSS/BLUE SHIELD | Admitting: *Deleted

## 2015-02-09 ENCOUNTER — Ambulatory Visit (INDEPENDENT_AMBULATORY_CARE_PROVIDER_SITE_OTHER): Payer: BLUE CROSS/BLUE SHIELD | Admitting: Nurse Practitioner

## 2015-02-09 ENCOUNTER — Encounter: Payer: Self-pay | Admitting: Nurse Practitioner

## 2015-02-09 VITALS — BP 110/68 | HR 80 | Resp 20 | Ht 67.0 in | Wt 168.0 lb

## 2015-02-09 DIAGNOSIS — E785 Hyperlipidemia, unspecified: Secondary | ICD-10-CM

## 2015-02-09 DIAGNOSIS — I10 Essential (primary) hypertension: Secondary | ICD-10-CM

## 2015-02-09 DIAGNOSIS — I213 ST elevation (STEMI) myocardial infarction of unspecified site: Secondary | ICD-10-CM

## 2015-02-09 MED ORDER — RANOLAZINE ER 500 MG PO TB12
500.0000 mg | ORAL_TABLET | Freq: Two times a day (BID) | ORAL | Status: DC
Start: 2015-02-09 — End: 2015-11-13

## 2015-02-09 NOTE — Patient Instructions (Addendum)
We will be checking the following labs today - BMET   Medication Instructions:    Continue with your current medicines.   I have refilled the Ranexa today  Take only 1/2 of Lasix (10mg ) only if needed    Testing/Procedures To Be Arranged:  N/A  Follow-Up:   See me on Friday, July 22nd at 3:00 pm  See Dr. Herbie Baltimore in 6 to 8 weeks    Other Special Instructions:   Remember - work your feet before getting up  Restrict your salt  Call the [facility] office at [phone] if you have any questions, problems or concerns.

## 2015-02-09 NOTE — Progress Notes (Addendum)
CARDIOLOGY OFFICE NOTE  Date:  02/09/2015    Laura Herrera Date of Birth: February 11, 1951 Medical Record #[id number]  PCP:  Darrow Bussing, MD  Cardiologist:  Herbie Baltimore    Chief Complaint  Patient presents with  . Coronary Artery Disease    Follow up visit - 4 day check - seen for Dr. Herbie Baltimore    History of Present Illness: Laura Herrera is a 64 y.o. female who presents today for a 4 day check. Seen for Dr. Herbie Baltimore. She has a PMH of hypertension, dyslipidemia and CAD with STEMI 01/11/15 of the RCA with mid/distal and ostial disease.   She has had lots of stress with her husband committing suicide several years ago, has lost both her parents and for some reason is on the outs with her daughter  She had a emergent diagnostic catheterization 6/16 revealing severe ostial and proximal RCA disease with a total occlusion in the proximal to midsection of the artery. She was noted to have significant tortuosity of the right coronary artery. The LAD and left circumflex had nonobstructive disease. She underwent percutaneous intervention with drug eluting stent placement with the ostial RCA, proximal RCA, and proximal mid RCA. Ostial stent extends out into the aorta. She also had distal disease which was successfully treated with balloon angioplasty only. Unfortunately, she continued to have chest discomfort and ongoing ST segment elevation following the procedure. She was taken back to the cardiac catheterization laboratory on the morning of June 7 with a relook catheterization revealed heavy thrombus throughout the proximal and mid RCA with TIMI 3 flow. The interventional team had difficulty engaging the ostium of the right coronary artery.    She has had recurrent shoulder/chest pain since her initial event. She has been back to the ER. She has had multiple phone calls since her last discharge. She now feels that her shoulder pain may have just been in fact shoulder pain - she had been moving lots of  heavy boxes right before her MI. She has been short of breath - has been placed on Lasix and increased doses of nitrates.  I saw her this past Friday - she was a wreck. Very tearful. Dizzy. Short of breath. Scared. She had had no more chest pain. I stopped her Lasix and changed to just prn use. Changed Brilinta to Effient. Checked lab and had her come back for early follow up.   Comes in today. Here alone today. Says she is no better but in fact I think she is. She says she felt "normal" this past Saturday. She proceeded to eat several bowls of soup - with lots of salt - gained 4 pounds and took a Lasix - then felt weak and dizzy. She has had no more chest pain. No shoulder pain. Breathing is ok - gets short of breath but not like it was. She will get dizzy if she stands up too quick. No syncope.  One item that is bothering her is that she feels like she was misdiagnosed by her PCP. She had had chest pain on Sunday night - June 5th - went to her PCP's office and saw Dr. Hyman Hopes and was told she had an abnormal EKG - but was not having chest pain at that time. Was told that cardiology would be contacted the following morning. She was in the ER 3 hours later on that Monday. Troponin was 14. CK MB 75 noted on arrival.    Past Medical History  Diagnosis Date  .  Psoriasis   . MRSA infection     h/o MRSA infection in the right foot  . Anxiety   . Depression   . Insomnia   . PONV (postoperative nausea and vomiting)   . Coronary artery disease     a. 01/2015 Inf STEMI/PCI: RCA 80ost (2.75x12 Promus Premier DES), 100p (2.5x24 Promus Premier DES), 70p/m (2.5x24 Promus Premier DES), 90d (PTCA);  b. 01/2015 Relook Cath: LM nl, LAD 76m, D1 min irregs, LCX nl- L->R collats,  OM1 mod dzs, OM2 mild dizs, RCA 10ost, heavy thrombus p/m, 10d->Aggrastat;  c. 01/2015 Echo: EF 60-65%, no rwma, mod AI, PASP .  Marland Kitchen Hypertension   . Dyslipidemia     Past Surgical History  Procedure Laterality Date  . Cardiac  catheterization    . Cardiac catheterization N/A 01/11/2015    Procedure: Left Heart Cath and Coronary Angiography;  Surgeon: Marykay Lex, MD;  Location: [facility] INVASIVE CV LAB;  Service: Cardiovascular;  Laterality: N/A;  . Cardiac catheterization  01/11/2015    Procedure: Coronary Stent Intervention;  Surgeon: Marykay Lex, MD;  Location: [facility] INVASIVE CV LAB;  Service: Cardiovascular;;  . Cardiac catheterization  01/11/2015    Procedure: Coronary Balloon Angioplasty;  Surgeon: Marykay Lex, MD;  Location: [facility] INVASIVE CV LAB;  Service: Cardiovascular;;  . Cardiac catheterization N/A 01/12/2015    Procedure: Left Heart Cath and Coronary Angiography;  Surgeon: Lyn Records, MD;  Location: [facility] INVASIVE CV LAB;  Service: Cardiovascular;  Laterality: N/A;  . Angioplasty  01/12/2015    Procedure: Angioplasty;  Surgeon: Lyn Records, MD;  Location: [facility] INVASIVE CV LAB;  Service: Cardiovascular;;     Medications: Current Outpatient Prescriptions  Medication Sig Dispense Refill  . ALPRAZolam (XANAX) 0.5 MG tablet Take 0.5 mg by mouth 3 (three) times daily.    Marland Kitchen aspirin EC 81 MG EC tablet Take 1 tablet (81 mg total) by mouth daily.    Marland Kitchen atorvastatin (LIPITOR) 80 MG tablet Take 1 tablet (80 mg total) by mouth daily. 30 tablet 6  . clobetasol cream (TEMOVATE) 0.05 % Apply 1 application topically 2 (two) times daily.    . furosemide (LASIX) 20 MG tablet Take 1 tablet (20 mg total) by mouth daily as needed. 30 tablet 6  . isosorbide mononitrate (IMDUR) 30 MG 24 hr tablet Take 1 tablet (30 mg total) by mouth daily. 30 tablet 3  . metoprolol tartrate (LOPRESSOR) 25 MG tablet Take 0.5 tablets (12.5 mg total) by mouth 2 (two) times daily. 30 tablet 6  . nitroGLYCERIN (NITROSTAT) 0.4 MG SL tablet Place 1 tablet (0.4 mg total) under the tongue every 5 (five) minutes x 3 doses as needed for chest pain. 25 tablet 3  . pantoprazole (PROTONIX) 40 MG tablet Take 1 tablet (40 mg total) by mouth daily at 12 noon. 30  tablet 1  . prasugrel (EFFIENT) 10 MG TABS tablet Take 1 tablet (10 mg total) by mouth daily. 90 tablet 3  . ranolazine (RANEXA) 500 MG 12 hr tablet Take 1 tablet (500 mg total) by mouth 2 (two) times daily. 30 tablet 3   No current facility-administered medications for this visit.    Allergies: Allergies  Allergen Reactions  . 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  . Shellfish-Derived Products Other (See Comments)    Crabs, lobster, crayfish  . Vancomycin     REACTION: severe rash/hives    Social History: The patient  reports that she has quit smoking. Her smoking use included Cigarettes. She does not have any smokeless tobacco history on file. She reports that she drinks about 8.4 oz of alcohol per week. She reports that she does not use illicit drugs.   Family History: The patient's family history includes Alzheimer's disease in her father; Heart Problems in her sister; Heart disease in her maternal grandmother; Hyperlipidemia in her brother and mother; Hypotension in her sister; Mental illness in her sister; Peripheral vascular disease in her mother; Schizophrenia in her sister.   Review of Systems: Please see the history of present illness.   Otherwise, the review of systems is positive for none.   All other systems are reviewed and negative.   Physical Exam: VS:  BP 110/68 mmHg  Pulse 80  Resp 20  Ht 5\' 7"  (1.702 m)  Wt 168 lb (76.204 kg)  BMI 26.31 kg/m2  SpO2 98% .  BMI Body mass index is 26.31 kg/(m^2).  Wt Readings from Last 3 Encounters:  02/09/15 168 lb (76.204 kg)  02/05/15 168 lb (76.204 kg)  01/22/15 170 lb (77.111 kg)    General: Pleasant. Well developed, well nourished and in no acute distress.  HEENT: Normal. Neck: Supple, no JVD, carotid bruits, or masses noted.  Cardiac: Regular rate and rhythm. No murmurs, rubs, or gallops. No edema.  Respiratory:  Lungs are clear  to auscultation bilaterally with normal work of breathing.  GI: Soft and nontender.  MS: No deformity or atrophy. Gait and ROM intact. Skin: Warm and dry. Color is normal.  Neuro:  Strength and sensation are intact and no gross focal deficits noted.  Psych: Alert, appropriate and with normal affect.   LABORATORY DATA:  EKG:  EKG is not ordered today.   Lab Results  Component Value Date   WBC 10.5 02/05/2015   HGB 11.6* 02/05/2015   HCT 34.8* 02/05/2015   PLT 475.0* 02/05/2015   GLUCOSE 114* 02/05/2015   CHOL 147 01/12/2015   TRIG 69 01/12/2015   HDL 58 01/12/2015   LDLCALC 75 01/12/2015   ALT 17 02/05/2015   AST 21 02/05/2015   NA 134* 02/05/2015   K 4.0 02/05/2015   CL 99 02/05/2015   CREATININE 1.51* 02/05/2015   BUN 16 02/05/2015   CO2 25 02/05/2015   TSH 3.035 01/11/2015   INR 5.39* 01/11/2015   HGBA1C 5.7* 01/11/2015    BNP (last 3 results)  Recent Labs  01/11/15 2258 01/20/15 2358  BNP 289.6* 173.4*    ProBNP (last 3 results)  Recent Labs  02/05/15 1525  PROBNP 121.0*     Other Studies Reviewed Today: Echo Study Conclusions from June 2016  - Left ventricle: The cavity size was normal. Wall thickness was increased in a pattern of mild LVH. Systolic function was normal. The estimated ejection fraction was in the range of 60% to 65%. Wall motion was normal; there were no regional wall motion abnormalities. Left ventricular diastolic function parameters were normal. - Aortic valve: There was moderate regurgitation. - Mitral valve: Calcified annulus. - Pulmonary arteries: PA peak pressure: 37 mm Hg (S).  Conclusion    1. Ost RCA lesion, 10% stenosed. A drug-eluting stent was placed. 2. Mid RCA stent has protrusion of thrombus/tissue into the lumen. TIMI grade III flow is noted. 3. Distal RCA emboli involving a PL branch   The ostial and mid RCA stents are patent. The ostial stent which extends into the ascending aorta prevents  coaxial selective engagement.  Intraluminal filling defect within the mid RCA stent.  Evidence of distal PL branch embolization  Ongoing chest pain despite TIMI grade 3 flow suggests distal embolization as the cause of her current symptoms.  Inability to freely steer the guidewire beyond the proximal third of the mid stent. Balloon angioplasty was not attempted due to tenuous CAD catheter support and high likelihood of guidewire course beyond/outside stent struts.   RECOMMENDATIONS:   I don't believe there is a further PCI option. Medical therapy is best option.  Prolonged anticoagulation as tolerated with Aggrastat for up to least 48 hours.  If recurrent angina, consideration of emergency bypass surgery however this would not provide survival benefit.  Prognosis for long-term patency of the RCA is guarded.    Conclusion    4. Extremely tortuous coronary arteries with severe single-vessel disease involving the ostium and mid RCA as follows. 5. Prox RCA lesion, 100% stenosed. Prox RCA to Mid RCA lesion, 70% stenosed. A single Promus Premier DES (drug-eluting stent) was placed covering both lesions. There is a 0% residual stenosis post intervention. 6. Ost RCA lesion, 80% stenosed. A second Promus Premier drug-eluting stent was placed. There is a 10% residual stenosis post intervention. 7. Dist RCA lesion, 90% stenosed. Balloon angioplasty/PTCA was performed. There is a 10% residual stenosis post intervention. 8. Minimal disease in the left coronary system.  Successful, difficult/complex multisite PCI in the RCA involving an ostial 80% lesion followed by 100% heavily thrombotic occlusion with tandem distal 70% stenosis. The ostial and tandem proximal-mid vessel lesions or each treated with DES stents. This was in conjunction also a 90% distal RCA/right posterior AV groove lesion is also treated with PTCA.  This is a very complex procedure requiring the use of several guide  catheters, buddy wire technique, adjunctive antiplatelet agents and multisite PTCA with multisite PCI and included ostial lesion. Over 2-1/2 hours was spent on this case.   Marykay Lex, M.D., M.S. Interventional Cardiologist     Assessment/Plan: 1. STEMI with extensive PCI of the RCA - ostial stent extending into the aorta. She is on DAPT with Effient now. I think she has improved. Would limit the salt. Use only 10 mg of Lasix prn. No other changes in her medicines. Encouraged to go to cardiac rehab. I think this will help her confidence letter. She has signed a release for her records from PCP's office to be reviewed.   2. HTN - BP is ok on current regimen. I have left her on her current regimen for now.   3. Situational stress - I hope this will improve with time.   4. HLD - on statin - LFTS were up some - will recheck today.           Current medicines are reviewed with the patient today.  The patient does not have concerns regarding medicines other than what has been noted above.  The following changes have been made:  See above.  Labs/ tests ordered today include:    Orders Placed This Encounter  Procedures  . Basic metabolic panel     Disposition:   FU with Dr. Herbie Baltimore in 6 to 8  weeks. Will see me on July 22nd at 3:00 pm.    Patient is agreeable to this plan and will call if any problems develop in the interim.   Signed: Rosalio Macadamia, RN, ANP-C 02/09/2015 4:07 PM  [facility] [address] [place], [place]  [address] Phone: [phone] Fax: [phone]        Addendum from Dr. Herbie Baltimore  Thanks for findings on the time of her period diagnoses difficult situation.         I would have a low threshold for checking a Myoview on her just to see if that RCA is ischemic. I think she probably had a sizable infarct, but I don't think that there will be any significant ischemia. We would know if she had  occlusion of either stent -- She would have ST elevations.        A Negative Myoview would do a lot toward reassurance.            DH    ----- Message -----     From: Rosalio Macadamia, NP     Sent: 02/09/2015  4:42 PM      To: Marykay Lex, MD

## 2015-02-10 ENCOUNTER — Other Ambulatory Visit: Payer: Self-pay | Admitting: *Deleted

## 2015-02-10 ENCOUNTER — Telehealth (HOSPITAL_COMMUNITY): Payer: Self-pay | Admitting: *Deleted

## 2015-02-10 LAB — BASIC METABOLIC PANEL
BUN: 17 mg/dL (ref 6–23)
CO2: 26 mEq/L (ref 19–32)
Calcium: 10.2 mg/dL (ref 8.4–10.5)
Chloride: 100 mEq/L (ref 96–112)
Creatinine, Ser: 1.59 mg/dL — ABNORMAL HIGH (ref 0.40–1.20)
GFR: 34.83 mL/min — ABNORMAL LOW (ref >60.00)
Glucose, Bld: 116 mg/dL — ABNORMAL HIGH (ref 70–99)
Potassium: 4.5 mEq/L (ref 3.5–5.1)
Sodium: 135 mEq/L (ref 135–145)

## 2015-02-10 MED ORDER — FUROSEMIDE 20 MG PO TABS: 10.0000 mg | ORAL_TABLET | ORAL | Status: DC | PRN

## 2015-02-10 NOTE — Telephone Encounter (Signed)
-----   Message from Rosalio Macadamia, NP sent at 02/09/2015  4:42 PM EDT ----- Ok to start cardiac rehab.  Lawson Fiscal

## 2015-02-18 ENCOUNTER — Telehealth (HOSPITAL_COMMUNITY): Payer: Self-pay | Admitting: *Deleted

## 2015-02-22 ENCOUNTER — Other Ambulatory Visit: Payer: Self-pay | Admitting: Cardiology

## 2015-02-22 MED ORDER — ISOSORBIDE MONONITRATE ER 30 MG PO TB24: 30.0000 mg | ORAL_TABLET | Freq: Every day | ORAL | Status: DC

## 2015-02-22 NOTE — Telephone Encounter (Signed)
°  1. Which medications need to be refilled? Isosorbide - new prescription-new directions 1 tablet a day-please call today,out of her medicine  2. Which pharmacy is medication to be sent to?[phone] 3. Do they need a 30 day or 90 day supply? 90 and refills  4. Would they like a call back once the medication has been sent to the pharmacy? yes

## 2015-02-22 NOTE — Telephone Encounter (Signed)
E-scribed Isosorbide #90 w/1 refill. Patient notified.

## 2015-02-26 ENCOUNTER — Encounter: Payer: Self-pay | Admitting: Nurse Practitioner

## 2015-02-26 ENCOUNTER — Ambulatory Visit (INDEPENDENT_AMBULATORY_CARE_PROVIDER_SITE_OTHER): Payer: BLUE CROSS/BLUE SHIELD | Admitting: Nurse Practitioner

## 2015-02-26 VITALS — BP 130/64 | HR 65 | Ht 67.0 in | Wt 170.8 lb

## 2015-02-26 DIAGNOSIS — I10 Essential (primary) hypertension: Secondary | ICD-10-CM

## 2015-02-26 DIAGNOSIS — Z955 Presence of coronary angioplasty implant and graft: Secondary | ICD-10-CM | POA: Diagnosis not present

## 2015-02-26 DIAGNOSIS — E785 Hyperlipidemia, unspecified: Secondary | ICD-10-CM

## 2015-02-26 NOTE — Progress Notes (Signed)
CARDIOLOGY OFFICE NOTE  Date:  02/26/2015    Laura Herrera Date of Birth: 14-Oct-1950 Medical Record #[id number]  PCP:  Darrow Bussing, MD  Cardiologist:  Herbie Baltimore    Chief Complaint  Patient presents with  . Coronary Artery Disease    Follow up visit - seen for Dr. Herbie Baltimore.     History of Present Illness: Laura Herrera is a 64 y.o. female who presents today for a follow up visit. Seen for Dr. Herbie Baltimore.   She has a PMH of hypertension, dyslipidemia and CAD with STEMI 01/11/15 of the RCA with mid/distal and ostial disease.   She has had lots of stress with her husband committing suicide several years ago, has lost both her parents and just lots of family issues.   She had a emergent diagnostic catheterization 6/16 revealing severe ostial and proximal RCA disease with a total occlusion in the proximal to midsection of the artery. She was noted to have significant tortuosity of the right coronary artery. The LAD and left circumflex had nonobstructive disease. She underwent percutaneous intervention with drug eluting stent placement with the ostial RCA, proximal RCA, and proximal mid RCA. Ostial stent extends out into the aorta. She also had distal disease which was successfully treated with balloon angioplasty only. Unfortunately, she continued to have chest discomfort and ongoing ST segment elevation following the procedure. She was taken back to the cardiac catheterization laboratory on the morning of June 7 with a relook catheterization revealing heavy thrombus throughout the proximal and mid RCA with TIMI 3 flow. The interventional team had difficulty engaging the ostium of the right coronary artery.   She has had recurrent shoulder/chest pain since her initial event. She has been back to the ER. She has had multiple phone calls since her last discharge. She now feels that her shoulder pain may have just been in fact shoulder pain - she had been moving lots of heavy boxes right  before her MI. She has been short of breath - has been placed on Lasix and increased doses of nitrates.  I have seen her several times since her admission. Pretty emotional at the first visit. Short of breath. Changed Brilinta to Effient. Have changed her lasix to just prn - she has had a tendency to get over diuresed with low dose Lasix. She was quite concerned about how she originally presented and wanted [place] to review her records which we did today.   Comes in today. Here alone today. She feels pretty good. Feels like she is getting back to herself now. Has stopped her Xanax - thought that was making her cry. Still a little dizzy if gets up too fast. Some fleeting chest pain. No NTG use. Still with some shortness of breath - has with showering or hurrying - but this is improving. She tires easily. To start rehab August 4th - she has issues with walking - has hand/foot psoriasis. Continues to have sleep issues - this is chronic - she tends to stay up very late and not go to sleep until early morning. Planning on going to the beach next week with her daughter.   Past Medical History  Diagnosis Date  . Psoriasis   . MRSA infection     h/o MRSA infection in the right foot  . Anxiety   . Depression   . Insomnia   . PONV (postoperative nausea and vomiting)   . Coronary artery disease     a. 01/2015 Inf STEMI/PCI: RCA  80ost (2.75x12 Promus Premier DES), 100p (2.5x24 Promus Premier DES), 70p/m (2.5x24 Promus Premier DES), 90d (PTCA);  b. 01/2015 Relook Cath: LM nl, LAD 52m, D1 min irregs, LCX nl- L->R collats,  OM1 mod dzs, OM2 mild dizs, RCA 10ost, heavy thrombus p/m, 10d->Aggrastat;  c. 01/2015 Echo: EF 60-65%, no rwma, mod AI, PASP .  Marland Kitchen Hypertension   . Dyslipidemia     Past Surgical History  Procedure Laterality Date  . Cardiac catheterization    . Cardiac catheterization N/A 01/11/2015    Procedure: Left Heart Cath and Coronary Angiography;  Surgeon: Marykay Lex, MD;  Location: [facility]  INVASIVE CV LAB;  Service: Cardiovascular;  Laterality: N/A;  . Cardiac catheterization  01/11/2015    Procedure: Coronary Stent Intervention;  Surgeon: Marykay Lex, MD;  Location: [facility] INVASIVE CV LAB;  Service: Cardiovascular;;  . Cardiac catheterization  01/11/2015    Procedure: Coronary Balloon Angioplasty;  Surgeon: Marykay Lex, MD;  Location: [facility] INVASIVE CV LAB;  Service: Cardiovascular;;  . Cardiac catheterization N/A 01/12/2015    Procedure: Left Heart Cath and Coronary Angiography;  Surgeon: Lyn Records, MD;  Location: [facility] INVASIVE CV LAB;  Service: Cardiovascular;  Laterality: N/A;  . Angioplasty  01/12/2015    Procedure: Angioplasty;  Surgeon: Lyn Records, MD;  Location: [facility] INVASIVE CV LAB;  Service: Cardiovascular;;     Medications: Current Outpatient Prescriptions  Medication Sig Dispense Refill  . aspirin EC 81 MG EC tablet Take 1 tablet (81 mg total) by mouth daily.    Marland Kitchen atorvastatin (LIPITOR) 80 MG tablet Take 1 tablet (80 mg total) by mouth daily. 30 tablet 6  . clobetasol cream (TEMOVATE) 0.05 % Apply 1 application topically 2 (two) times daily.    . furosemide (LASIX) 20 MG tablet Take 0.5 tablets (10 mg total) by mouth as needed. 30 tablet 6  . isosorbide mononitrate (IMDUR) 30 MG 24 hr tablet Take 1 tablet (30 mg total) by mouth daily. 90 tablet 1  . metoprolol tartrate (LOPRESSOR) 25 MG tablet Take 0.5 tablets (12.5 mg total) by mouth 2 (two) times daily. 30 tablet 6  . nitroGLYCERIN (NITROSTAT) 0.4 MG SL tablet Place 1 tablet (0.4 mg total) under the tongue every 5 (five) minutes x 3 doses as needed for chest pain. 25 tablet 3  . pantoprazole (PROTONIX) 40 MG tablet Take 1 tablet (40 mg total) by mouth daily at 12 noon. 30 tablet 1  . prasugrel (EFFIENT) 10 MG TABS tablet Take 1 tablet (10 mg total) by mouth daily. 90 tablet 3  . ranolazine (RANEXA) 500 MG 12 hr tablet Take 1 tablet (500 mg total) by mouth 2 (two) times daily. 60 tablet 6   No current  facility-administered medications for this visit.    Allergies: Allergies  Allergen Reactions  . 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  . Shellfish-Derived Products Other (See Comments)    Crabs, lobster, crayfish  . Vancomycin     REACTION: severe rash/hives    Social History: The patient  reports that she has quit smoking. Her smoking use included Cigarettes. She does not have any smokeless tobacco history on file. She reports that she drinks about 8.4 oz of alcohol per week. She reports that she does not use illicit drugs.   Family History: The patient's family history includes Alzheimer's disease in her father; Heart Problems in her sister; Heart disease in her maternal grandmother; Hyperlipidemia in  her brother and mother; Hypotension in her sister; Mental illness in her sister; Peripheral vascular disease in her mother; Schizophrenia in her sister.   Review of Systems: Please see the history of present illness.   Otherwise, the review of systems is positive for none.   All other systems are reviewed and negative.   Physical Exam: VS:  BP 130/64 mmHg  Pulse 65  Ht 5\' 7"  (1.702 m)  Wt 170 lb 12.8 oz (77.474 kg)  BMI 26.74 kg/m2  SpO2 95% .  BMI Body mass index is 26.74 kg/(m^2).  Wt Readings from Last 3 Encounters:  02/26/15 170 lb 12.8 oz (77.474 kg)  02/09/15 168 lb (76.204 kg)  02/05/15 168 lb (76.204 kg)    General: Pleasant. Well developed, well nourished and in no acute distress.  HEENT: Normal. Neck: Supple, no JVD, carotid bruits, or masses noted.  Cardiac: Regular rate and rhythm. No murmurs, rubs, or gallops. No edema.  Respiratory:  Lungs are clear to auscultation bilaterally with normal work of breathing.  GI: Soft and nontender.  MS: No deformity or atrophy. Gait and ROM intact. Skin: Warm and dry. Color is normal.  Neuro:  Strength and sensation are intact and no  gross focal deficits noted.  Psych: Alert, appropriate and with normal affect.   LABORATORY DATA:  EKG:  EKG is not ordered today.   Lab Results  Component Value Date   WBC 10.5 02/05/2015   HGB 11.6* 02/05/2015   HCT 34.8* 02/05/2015   PLT 475.0* 02/05/2015   GLUCOSE 116* 02/09/2015   CHOL 147 01/12/2015   TRIG 69 01/12/2015   HDL 58 01/12/2015   LDLCALC 75 01/12/2015   ALT 17 02/05/2015   AST 21 02/05/2015   NA 135 02/09/2015   K 4.5 02/09/2015   CL 100 02/09/2015   CREATININE 1.59* 02/09/2015   BUN 17 02/09/2015   CO2 26 02/09/2015   TSH 3.035 01/11/2015   INR 5.39* 01/11/2015   HGBA1C 5.7* 01/11/2015    BNP (last 3 results)  Recent Labs  01/11/15 2258 01/20/15 2358  BNP 289.6* 173.4*    ProBNP (last 3 results)  Recent Labs  02/05/15 1525  PROBNP 121.0*     Other Studies Reviewed Today:  Echo Study Conclusions from June 2016  - Left ventricle: The cavity size was normal. Wall thickness was increased in a pattern of mild LVH. Systolic function was normal. The estimated ejection fraction was in the range of 60% to 65%. Wall motion was normal; there were no regional wall motion abnormalities. Left ventricular diastolic function parameters were normal. - Aortic valve: There was moderate regurgitation. - Mitral valve: Calcified annulus. - Pulmonary arteries: PA peak pressure: 37 mm Hg (S).  Conclusion    1. Ost RCA lesion, 10% stenosed. A drug-eluting stent was placed. 2. Mid RCA stent has protrusion of thrombus/tissue into the lumen. TIMI grade III flow is noted. 3. Distal RCA emboli involving a PL branch   The ostial and mid RCA stents are patent. The ostial stent which extends into the ascending aorta prevents coaxial selective engagement.  Intraluminal filling defect within the mid RCA stent.  Evidence of distal PL branch embolization  Ongoing chest pain despite TIMI grade 3 flow suggests distal embolization as the cause  of her current symptoms.  Inability to freely steer the guidewire beyond the proximal third of the mid stent. Balloon angioplasty was not attempted due to tenuous CAD catheter support and high likelihood of guidewire course beyond/outside  stent struts.   RECOMMENDATIONS:   I don't believe there is a further PCI option. Medical therapy is best option.  Prolonged anticoagulation as tolerated with Aggrastat for up to least 48 hours.  If recurrent angina, consideration of emergency bypass surgery however this would not provide survival benefit.  Prognosis for long-term patency of the RCA is guarded.    Conclusion    4. Extremely tortuous coronary arteries with severe single-vessel disease involving the ostium and mid RCA as follows. 5. Prox RCA lesion, 100% stenosed. Prox RCA to Mid RCA lesion, 70% stenosed. A single Promus Premier DES (drug-eluting stent) was placed covering both lesions. There is a 0% residual stenosis post intervention. 6. Ost RCA lesion, 80% stenosed. A second Promus Premier drug-eluting stent was placed. There is a 10% residual stenosis post intervention. 7. Dist RCA lesion, 90% stenosed. Balloon angioplasty/PTCA was performed. There is a 10% residual stenosis post intervention. 8. Minimal disease in the left coronary system.  Successful, difficult/complex multisite PCI in the RCA involving an ostial 80% lesion followed by 100% heavily thrombotic occlusion with tandem distal 70% stenosis. The ostial and tandem proximal-mid vessel lesions or each treated with DES stents. This was in conjunction also a 90% distal RCA/right posterior AV groove lesion is also treated with PTCA.  This is a very complex procedure requiring the use of several guide catheters, buddy wire technique, adjunctive antiplatelet agents and multisite PTCA with multisite PCI and included ostial lesion. Over 2-1/2 hours was spent on this case.   Marykay Lex, M.D., M.S. Interventional  Cardiologist     Assessment/Plan: 1. STEMI with extensive PCI of the RCA - ostial stent extending into the aorta. She is on DAPT with Effient now. She continues to improve but with some continued atypical symptoms - will proceed on with Myoview - would like to try and get a walking adenosine to get some sense of her exercise tolerance. Will complete prior to starting cardiac rehab.  2. HTN - BP is ok on current regimen. I have left her on her current regimen for now.   3. Situational stress - this is improving.   4. HLD - on statin    Overall, she does look much better. I have reviewed her records from her OV with PCP just prior to her MI - she was noted to not be having chest pain at the time of her visit and she was given the option of going to the ER or waiting for cardiology consult the following AM. The record notes that she declined proceeding on to the ER. EKG with inferior changes noted.               Current medicines are reviewed with the patient today.  The patient does not have concerns regarding medicines other than what has been noted above.  The following changes have been made:  See above.  Labs/ tests ordered today include:    Orders Placed This Encounter  Procedures  . Myocardial Perfusion Imaging     Disposition:   FU with Dr. Herbie Baltimore as planned in September.   Patient is agreeable to this plan and will call if any problems develop in the interim.   Signed: Rosalio Macadamia, RN, ANP-C 02/26/2015 3:49 PM  [facility] [address] [place], [place]  [address] Phone: [phone] Fax: [phone]

## 2015-02-26 NOTE — Patient Instructions (Addendum)
We will be checking the following labs today - None   Medication Instructions:    Continue with your current medicines.   We will see if we have samples of Effient    Testing/Procedures To Be Arranged:  Stress Myoview prior to starting cardiac rehab (late morning time)  Follow-Up:   See Dr. Herbie Baltimore as planned in September.     Other Special Instructions:   N/A  Call the [facility] office at [phone] if you have any questions, problems or concerns.

## 2015-03-02 ENCOUNTER — Telehealth (HOSPITAL_COMMUNITY): Payer: Self-pay

## 2015-03-02 NOTE — Telephone Encounter (Signed)
Patient given detailed instructions per Myocardial Perfusion Study Information Sheet for test on 03-08-2015 at 1230. Patient Notified to arrive 15 minutes early, and that it is imperative to arrive on time for appointment to keep from having the test rescheduled. Patient verbalized understanding. Laura Herrera, Laura Herrera A

## 2015-03-07 ENCOUNTER — Other Ambulatory Visit: Payer: Self-pay | Admitting: Physician Assistant

## 2015-03-08 ENCOUNTER — Ambulatory Visit (HOSPITAL_COMMUNITY): Payer: BLUE CROSS/BLUE SHIELD | Attending: Cardiovascular Disease

## 2015-03-08 DIAGNOSIS — R9439 Abnormal result of other cardiovascular function study: Secondary | ICD-10-CM | POA: Diagnosis not present

## 2015-03-08 DIAGNOSIS — I10 Essential (primary) hypertension: Secondary | ICD-10-CM | POA: Diagnosis not present

## 2015-03-08 DIAGNOSIS — Z955 Presence of coronary angioplasty implant and graft: Secondary | ICD-10-CM

## 2015-03-08 DIAGNOSIS — E785 Hyperlipidemia, unspecified: Secondary | ICD-10-CM

## 2015-03-08 LAB — MYOCARDIAL PERFUSION IMAGING
Estimated workload: 6.4 METS
Exercise duration (min): 4 min
Exercise duration (sec): 30 s
LV dias vol: 87 mL
LV sys vol: 28 mL
MPHR: 157 {beats}/min
Peak HR: 141 {beats}/min
Percent HR: 89 %
RATE: 0.26
RPE: 19
Rest HR: 54 {beats}/min
SDS: 7
SRS: 3
SSS: 10
TID: 0.8

## 2015-03-08 MED ORDER — TECHNETIUM TC 99M SESTAMIBI GENERIC - CARDIOLITE
32.0000 | Freq: Once | INTRAVENOUS | Status: AC | PRN
  Administered 2015-03-08: 32 via INTRAVENOUS

## 2015-03-08 MED ORDER — TECHNETIUM TC 99M SESTAMIBI GENERIC - CARDIOLITE
10.5000 | Freq: Once | INTRAVENOUS | Status: AC | PRN
  Administered 2015-03-08: 11 via INTRAVENOUS

## 2015-03-08 NOTE — Telephone Encounter (Signed)
Rx(s) sent to pharmacy electronically.  

## 2015-03-11 ENCOUNTER — Encounter (HOSPITAL_COMMUNITY)
Admission: RE | Admit: 2015-03-11 | Discharge: 2015-03-11 | Disposition: A | Discharge status: Home / Self Care | Payer: BLUE CROSS/BLUE SHIELD | Source: Ambulatory Visit | Attending: Cardiology | Admitting: Cardiology

## 2015-03-11 DIAGNOSIS — I252 Old myocardial infarction: Secondary | ICD-10-CM | POA: Insufficient documentation

## 2015-03-11 DIAGNOSIS — Z48812 Encounter for surgical aftercare following surgery on the circulatory system: Secondary | ICD-10-CM | POA: Insufficient documentation

## 2015-03-11 DIAGNOSIS — Z955 Presence of coronary angioplasty implant and graft: Secondary | ICD-10-CM | POA: Insufficient documentation

## 2015-03-11 NOTE — Progress Notes (Signed)
Cardiac Rehab Medication Review by a Pharmacist  Does the patient  feel that his/her medications are working for him/her?  yes  Has the patient been experiencing any side effects to the medications prescribed?  yes  Does the patient measure his/her own blood pressure or blood glucose at home?  yes   Does the patient have any problems obtaining medications due to transportation or finances?   no  Understanding of regimen: excellent Understanding of indications: good Potential of compliance: excellent    Pharmacist comments: 64 y/o F presents to cardiac rehab in good spirits and in no distress. Patient endorses a good understanding of their medication regimen. Does endorse some side effects to medications.  She is very sensitive to her furosemide and only takes it as needed. She is also still experiencing some SOB, however, it has improved as she had become more familiar with her medication regimen.   Sandi Carne, PharmD Pharmacy Resident Pager: [phone] 03/11/2015 8:23 AM

## 2015-03-14 ENCOUNTER — Other Ambulatory Visit: Payer: Self-pay | Admitting: Physician Assistant

## 2015-03-15 NOTE — Telephone Encounter (Signed)
Rx(s) sent to pharmacy electronically.  

## 2015-03-17 ENCOUNTER — Encounter (HOSPITAL_COMMUNITY)
Admission: RE | Admit: 2015-03-17 | Discharge: 2015-03-17 | Disposition: A | Discharge status: Home / Self Care | Payer: BLUE CROSS/BLUE SHIELD | Source: Ambulatory Visit | Attending: Cardiology | Admitting: Cardiology

## 2015-03-17 DIAGNOSIS — Z955 Presence of coronary angioplasty implant and graft: Secondary | ICD-10-CM | POA: Diagnosis not present

## 2015-03-17 DIAGNOSIS — Z48812 Encounter for surgical aftercare following surgery on the circulatory system: Secondary | ICD-10-CM | POA: Diagnosis not present

## 2015-03-17 DIAGNOSIS — I252 Old myocardial infarction: Secondary | ICD-10-CM | POA: Diagnosis not present

## 2015-03-17 NOTE — Progress Notes (Addendum)
Pt started cardiac rehab today.  Pt tolerated light exercise without difficulty. VSS, telemetry-Sinus Rhtyhm, asymptomatic.  Medication list reconciled.  Pt verbalized compliance with medications and denies barriers to compliance. PSYCHOSOCIAL ASSESSMENT:  PHQ-0. Pt exhibits positive coping skills, hopeful outlook with supportive family. No psychosocial needs identified at this time, no psychosocial interventions necessary.    Patients cardiac rehab  goal is  to gain strength and to get back into pialtes.  Pt encouraged to participate in exercising on your own and functional fitness   to increase ability to achieve these goals.   Pt long term cardiac rehab goal is keep her heart going well and to loose 10 lbs..  Pt oriented to exercise equipment and routine.  Understanding verbalized. Laura Herrera lost her parents 3 years ago and her husband 8 years ago. Laura Herrera has good family support and denies being depressed. Will continue to monitor the patient throughout  the program.

## 2015-03-19 ENCOUNTER — Encounter (HOSPITAL_COMMUNITY)
Admission: RE | Admit: 2015-03-19 | Discharge: 2015-03-19 | Disposition: A | Discharge status: Home / Self Care | Payer: BLUE CROSS/BLUE SHIELD | Source: Ambulatory Visit | Attending: Cardiology | Admitting: Cardiology

## 2015-03-19 DIAGNOSIS — Z48812 Encounter for surgical aftercare following surgery on the circulatory system: Secondary | ICD-10-CM | POA: Diagnosis not present

## 2015-03-29 ENCOUNTER — Encounter (HOSPITAL_COMMUNITY): Payer: BLUE CROSS/BLUE SHIELD

## 2015-03-31 ENCOUNTER — Encounter (HOSPITAL_COMMUNITY)
Admission: RE | Admit: 2015-03-31 | Discharge: 2015-03-31 | Disposition: A | Discharge status: Home / Self Care | Payer: BLUE CROSS/BLUE SHIELD | Source: Ambulatory Visit | Attending: Cardiology | Admitting: Cardiology

## 2015-03-31 DIAGNOSIS — Z48812 Encounter for surgical aftercare following surgery on the circulatory system: Secondary | ICD-10-CM | POA: Diagnosis not present

## 2015-04-02 ENCOUNTER — Encounter (HOSPITAL_COMMUNITY)
Admission: RE | Admit: 2015-04-02 | Discharge: 2015-04-02 | Disposition: A | Discharge status: Home / Self Care | Payer: BLUE CROSS/BLUE SHIELD | Source: Ambulatory Visit | Attending: Cardiology | Admitting: Cardiology

## 2015-04-02 DIAGNOSIS — Z48812 Encounter for surgical aftercare following surgery on the circulatory system: Secondary | ICD-10-CM | POA: Diagnosis not present

## 2015-04-02 NOTE — Progress Notes (Signed)
Pt arrived at cardiac rehab today c/o dyspnea on exertion more than usual with fatigue, associated with finger edema.  Pt also feels weak and tired.  Pt weight essentially unchanged this week however has been steady increase of 5lb over past 2 weeks.  Pt able to exercise without increase in symptoms however felt more fatigued than usual.  Pt reports she does have PRN order for lasix which she is not currently using.  Pt instructed to take lasix 10mg  tomorrow am.  Continue to monitor home weights and sodium restriction.   Understanding verbalized.

## 2015-04-05 ENCOUNTER — Encounter (HOSPITAL_COMMUNITY)
Admission: RE | Admit: 2015-04-05 | Discharge: 2015-04-05 | Disposition: A | Discharge status: Home / Self Care | Payer: BLUE CROSS/BLUE SHIELD | Source: Ambulatory Visit | Attending: Cardiology | Admitting: Cardiology

## 2015-04-05 ENCOUNTER — Telehealth: Payer: Self-pay

## 2015-04-05 DIAGNOSIS — R238 Other skin changes: Secondary | ICD-10-CM

## 2015-04-05 DIAGNOSIS — Z48812 Encounter for surgical aftercare following surgery on the circulatory system: Secondary | ICD-10-CM | POA: Diagnosis not present

## 2015-04-05 DIAGNOSIS — R233 Spontaneous ecchymoses: Secondary | ICD-10-CM

## 2015-04-05 NOTE — Progress Notes (Signed)
Patient has scattered large bruises to her upper torso. Two of the bruises are the size of a Silver Dollar.  The are several smaller bruises noted. Canary also has some small scattered bruises on the Right and left arm. The bruising has been present since the patient started the program on 03/17/2015. Dr harding's office called and notified. Spoke with Anabel Halon LPN. No complaints voiced with exercise. Will continue to monitor the patient throughout  the program.

## 2015-04-05 NOTE — Telephone Encounter (Signed)
Received a call from [facility] with Cardiac Rehab calling to report since pt has been coming to cardiac rehab 03/17/15 she has a lot of bruising across chest 2 bruises size of silver dollars others dime size.Also scattered bruising rt forearm.Message sent to Dr.Harding for advice.

## 2015-04-06 LAB — CBC
HEMATOCRIT: 33.8 % — AB (ref 36.0–46.0)
Hemoglobin: 10.9 g/dL — ABNORMAL LOW (ref 12.0–15.0)
MCH: 27.6 pg (ref 26.0–34.0)
MCHC: 32.2 g/dL (ref 30.0–36.0)
MCV: 85.6 fL (ref 78.0–100.0)
MPV: 9.7 fL (ref 8.6–12.4)
Platelets: 379 10*3/uL (ref 150–400)
RBC: 3.95 MIL/uL (ref 3.87–5.11)
RDW: 14.8 % (ref 11.5–15.5)
WBC: 7.3 10*3/uL (ref 4.0–10.5)

## 2015-04-06 NOTE — Telephone Encounter (Signed)
Left message to call back Order placed for CBC

## 2015-04-06 NOTE — Telephone Encounter (Signed)
If she is having bad bruising - hold ASA x 2-3 days.  Not sure how she is getting bruises on her chest though. Let's check a CBC to make sure her Platelet levels are ok.  DH

## 2015-04-06 NOTE — Telephone Encounter (Signed)
Patient called. Spoke to patient. Result given . Verbalized understanding Will get lab and restart Aspirin on Saturday.

## 2015-04-06 NOTE — Addendum Note (Signed)
Addended by: Tobin Chad on: 04/06/2015 09:47 AM   Modules accepted: Orders

## 2015-04-07 ENCOUNTER — Encounter (HOSPITAL_COMMUNITY)
Admission: RE | Admit: 2015-04-07 | Discharge: 2015-04-07 | Disposition: A | Discharge status: Home / Self Care | Payer: BLUE CROSS/BLUE SHIELD | Source: Ambulatory Visit | Attending: Cardiology | Admitting: Cardiology

## 2015-04-07 DIAGNOSIS — Z48812 Encounter for surgical aftercare following surgery on the circulatory system: Secondary | ICD-10-CM | POA: Diagnosis not present

## 2015-04-07 NOTE — Progress Notes (Signed)
Reviewed home exercise guidelines with patient including endpoints, temperature precautions, target heart rate and rate of perceived exertion. Pt plans to resume Pilates twice a as her mode of home exercise. Pt voices understanding of instructions given. Artist Pais, MS, ACSM CCEP

## 2015-04-08 ENCOUNTER — Telehealth: Payer: Self-pay | Admitting: *Deleted

## 2015-04-08 NOTE — Telephone Encounter (Signed)
Spoke to patient. Result given . Verbalized understanding  

## 2015-04-08 NOTE — Telephone Encounter (Signed)
-----   Message from Marykay Lex, MD sent at 04/07/2015  9:18 AM EDT ----- Lab levels look good - Hgb is only minimally down from last check, but higher than the preceding several levels. Indicates no significant bleed. Platelet levels are down a bit, but no enough to lead to spontaneous bruising/bleeding.  Marykay Lex, MD

## 2015-04-08 NOTE — Telephone Encounter (Signed)
-----   Message from David W Harding, MD sent at 04/07/2015  9:18 AM EDT ----- Lab levels look good - Hgb is only minimally down from last check, but higher than the preceding several levels. Indicates no significant bleed. Platelet levels are down a bit, but no enough to lead to spontaneous bruising/bleeding.  HARDING, DAVID W, MD  

## 2015-04-09 ENCOUNTER — Encounter (HOSPITAL_COMMUNITY)
Admission: RE | Admit: 2015-04-09 | Discharge: 2015-04-09 | Disposition: A | Discharge status: Home / Self Care | Payer: BLUE CROSS/BLUE SHIELD | Source: Ambulatory Visit | Attending: Cardiology | Admitting: Cardiology

## 2015-04-09 DIAGNOSIS — Z955 Presence of coronary angioplasty implant and graft: Secondary | ICD-10-CM | POA: Diagnosis not present

## 2015-04-09 DIAGNOSIS — Z48812 Encounter for surgical aftercare following surgery on the circulatory system: Secondary | ICD-10-CM | POA: Diagnosis not present

## 2015-04-09 DIAGNOSIS — I252 Old myocardial infarction: Secondary | ICD-10-CM | POA: Diagnosis not present

## 2015-04-19 ENCOUNTER — Encounter (HOSPITAL_COMMUNITY)
Admission: RE | Admit: 2015-04-19 | Discharge: 2015-04-19 | Disposition: A | Discharge status: Home / Self Care | Payer: BLUE CROSS/BLUE SHIELD | Source: Ambulatory Visit | Attending: Cardiology | Admitting: Cardiology

## 2015-04-19 ENCOUNTER — Telehealth: Payer: Self-pay | Admitting: Cardiology

## 2015-04-19 DIAGNOSIS — Z48812 Encounter for surgical aftercare following surgery on the circulatory system: Secondary | ICD-10-CM | POA: Diagnosis not present

## 2015-04-19 NOTE — Progress Notes (Addendum)
Laura Herrera returned to exercise today at cardiac rehab after being at the beach for a week. Laura Herrera's weight is 81.2 kg which is up  2.3 kg from 04/09/15. Laura Herrera denies shortness of breath but reports having swelling in her legs and feet and complains of her eyeballs feeling swollen. Upon assessment lung fields clear upon ausculation. Bilateral pitting lower extremity present greater on the left.  Oxygen saturation 100% on room air. Laura Knack RN, Laura Herrera's nurse called and notified. Laura Herrera instructed the patient to take an extra furosemide over the phone. Laura Herrera just returned from a weeks vacation at the beach last night. Laura Herrera did not exercise today. Laura Herrera has an appointment to see Laura Herrera tomorrow afternoon. .Will fax exercise flow sheets to Laura. Elissa Herrera office for review.

## 2015-04-19 NOTE — Telephone Encounter (Signed)
Spoke to [employer] RN- REHAB PATIENT AT CARDIAC REHAB-  PATIENT WENT OUT [place]OWN [place] PER [place], LUNGS CLEAR, LEG EDMA  WEIGHT INCREASE TO 178.86 LB FROM 173.5 LBS FROM 04/09/15. SHE STATES PATIENT TOOK LASIX 20 MG LAST EVENING.  RN SPOKE TO PATIENT INFORMED HER TAKE ANTHER 20 MG TODAY AND KEEP SCHEDULE APPOINTMENT  TOMORROW WITH DR HARDING. PATIENT VERBALIZED UNDERSTANDING.

## 2015-04-20 ENCOUNTER — Ambulatory Visit (INDEPENDENT_AMBULATORY_CARE_PROVIDER_SITE_OTHER): Payer: BLUE CROSS/BLUE SHIELD | Admitting: Cardiology

## 2015-04-20 ENCOUNTER — Encounter: Payer: Self-pay | Admitting: Cardiology

## 2015-04-20 VITALS — BP 120/70 | HR 57 | Ht 67.0 in | Wt 175.1 lb

## 2015-04-20 DIAGNOSIS — I2119 ST elevation (STEMI) myocardial infarction involving other coronary artery of inferior wall: Secondary | ICD-10-CM | POA: Diagnosis not present

## 2015-04-20 DIAGNOSIS — E785 Hyperlipidemia, unspecified: Secondary | ICD-10-CM

## 2015-04-20 DIAGNOSIS — Z9861 Coronary angioplasty status: Secondary | ICD-10-CM

## 2015-04-20 DIAGNOSIS — I2510 Atherosclerotic heart disease of native coronary artery without angina pectoris: Secondary | ICD-10-CM

## 2015-04-20 DIAGNOSIS — I10 Essential (primary) hypertension: Secondary | ICD-10-CM

## 2015-04-20 DIAGNOSIS — R600 Localized edema: Secondary | ICD-10-CM

## 2015-04-20 NOTE — Progress Notes (Signed)
PATIENT: Laura Herrera MRN: [id number] DOB: [birth year] PCP: Darrow Bussing, MD  Clinic Note: Chief Complaint  Patient presents with  . Follow-up    pt c/o SOB. no chest pain or leg swelling    HPI: Laura Herrera is a 64 y.o. female with a PMH below who presents today for f/u for CAD- Inferior STEMI 01/11/2015:  Severe Ostial RCA & mid 100%.  PCI.   She initially presented with an inferior STEMI the evening of the sixth. Her symptoms were relatively atypical with a discomfort under left breast that started in the back. She was found to have an occluded RCA with severe ostial/proximal disease that complicated the initial intervention. There was significant damping of the catheter with guide engagement. Ultimately, both the 100% followed by 70% proximal-mid RCA lesions and the ostial 80% lesion were treated with Promus DES stents.there was also distal embolization of some of the upstream thrombus that temporarily occluded down to posterolateral and PDA system. She was treated with IIb IIIa inhibitor overnight, despite this she had recurrence of pain earlier in the morning with persistent ST elevations and was taken back to the cardiac catheterization lab. Some mild haziness was noted in the mid RCA stent, but the vessel itself could not be rewired due to the ostial stent.  She was discharged on June 12, but was then readmitted on the 16th with recurrent chest pain believed nitroglycerin. Initially her EKG was called as a STEMI, but this was canceled. Her symptoms significantly improved after nitroglycerin.  She was monitored overnight with a stable troponin trended downwards. She was started on Ranexa and Imdur.she was also started on low-dose Lasix for dyspnea.  2 days later she went back to the emergency room -- her Imdur dose was increased to 15-30 mg. She had another episode of left-sided shoulder pain that did not have any relief with supplemental nitroglycerin. By the time she reached the  hospital, she had no further symptoms.  She was then discharged for close followup.  She has been seen twice by Norma Fredrickson, NP-C on July 1 and the 22nd.   -- she continued to note recurrent shoulder or chest discomfort ever since her discharge. She feels that maybe this was actually musculoskeletal symptoms.  Unfortunately when she was initially seen on the first, she was a tearful busy poor balance. Noting that the Lasix really made her dizzy.  July 1 -- changed from Brilinta to Effient (? Breathing concerns); Lasix stopped - made PRN   July 22 --  Feeling pretty well. Almost back to her normal self.  Still somewhat distended dizziness. Mild dyspnea with exertion. -- Planned beach trip with daughter --> Myoview ordered to evaluate dyspnea, and persistent symptoms  She has subsequently started cardiac rehabilitation.   Studies Reviewed:  Initial Cardiac catheterization with PCI as well as follow catheterization  Cath #1:   Extremely tortuous coronary arteries with severe single-vessel disease involving the ostium and mid RCA as follows.  Prox RCA lesion, 100% stenosed. Prox RCA to Mid RCA lesion, 70% stenosed. A single Promus Premier DES (drug-eluting stent) was placed covering both lesions. There is a 0% residual stenosis post intervention.  Ost RCA lesion, 80% stenosed. A second Promus Premier drug-eluting stent was placed. There is a 10% residual stenosis post intervention.  Dist RCA lesion, 90% stenosed. Balloon angioplasty/PTCA was performed. There is a 10% residual stenosis post intervention.  Minimal disease in the left coronary system.  Successful, difficult/complex multisite PCI in the RCA  involving an ostial 80% lesion followed by 100% heavily thrombotic occlusion with tandem distal 70% stenosis. The ostial and tandem proximal-mid vessel lesions or each treated with DES stents. This was in conjunction also a 90% distal RCA/right posterior AV groove lesion is also treated  with PTCA.    Re-look Catheterization:  Ost RCA lesion, 10% stenosed. A drug-eluting stent was placed.  Mid RCA stent has protrusion of thrombus/tissue into the lumen. TIMI grade III flow is noted.  Distal RCA emboli involving a PL branch   The ostial and mid RCA stents are patent. The ostial stent which extends into the ascending aorta prevents coaxial selective engagement.  Intraluminal filling defect within the mid RCA stent.  Evidence of distal PL branch embolization  Ongoing chest pain despite TIMI grade 3 flow suggests distal embolization as the cause of her current symptoms.  Inability to freely steer the guidewire beyond the proximal third of the mid stent. Balloon angioplasty was not attempted due to tenuous CAD catheter support and    2D Echo 01/11/25:   Left ventricle: The cavity size was normal. mild LVH. Systolic function was normal - no regional WMA. EF ~60% to 65%.  Left ventricular diastolic function parameters were normal.  Aortic valve: There was moderate regurgitation  Mitral valve: Calcified annulus  Pulmonary arteries: PA peak pressure: 37 mm Hg (S).    Myoview 03/08/2015:  LOW RISK  Medium size mild severity - partially reversible defect in the basal inferior, basal inferolateral and mid inferolateral as well as apical location consistent with inferior subendocardial MI. No evidence of ischemia.  EF >65% -- hyperdynamic  Was @ [facility] -- took lasix Monday -- wgt was up a bit.    Interval History: Laura Herrera a June 3 were all along since her last visit with [facility]. She actually was on her trip to the beach this past weekend. Unfortunately she started noticing increased weight gain and dyspnea over the weekend. She was evaluated at cardiac rehabilitation on the 12th/ Monday and was noted to be less short of breath with clear lungs, but still had lower extremity edema and mild exertional dyspnea. She had taken 20 mg of Lasix the previous evening.  She was instructed to take another 20 mg on the 12th and followup as scheduled.  She comes in today actually noting that her swelling has notably improved but still is mild puffy bilaterally. Her weight is down  somewhat, but still about 2 pounds.  She is notably less short of breath.  No further PND or orthopnea. She still has intermittent mild twinge-like symptoms in her shoulders that are somewhat reminiscent of her MI related angina, but they're not really associated with any exertion or any particular activity.  She is not having any significant anginal type symptoms that she had commensurate with the timing of her PCI. She is actually doing relatively well in cardiac rehabilitation, and is usually notable to pretty good blood pressure and heart rate response. If she overdoes it she will double short of breath and maybe a little dizzy. For the most part no major issues. Despite having some dizzy spells, no further near syncopal type symptoms.   Cardiovascular ROS: positive for - dyspnea on exertion, edema and No further PND orthopnea; mild atypical chest and shoulder discomfort negative for - chest pain, irregular heartbeat, loss of consciousness, murmur, orthopnea, palpitations, paroxysmal nocturnal dyspnea, rapid heart rate or Near-syncope, TIA/amaurosis fugax  Past Medical History  Diagnosis Date  . Psoriasis   .  MRSA infection     h/o MRSA infection in the right foot  . Anxiety   . Depression   . Insomnia   . PONV (postoperative nausea and vomiting)   . Coronary artery disease     a. 01/2015 Inf STEMI/PCI: RCA 80ost (2.75x12 Promus Premier DES), 100p (2.5x24 Promus Premier DES), 70p/m (2.5x24 Promus Premier DES), 90d (PTCA);  b. 01/2015 Relook Cath: LM nl, LAD 8m, D1 min irregs, LCX nl- L->R collats,  OM1 mod dzs, OM2 mild dizs, RCA 10ost, heavy thrombus p/m, 10d->Aggrastat;  c. 01/2015 Echo: EF 60-65%, no rwma, mod AI, PASP .  Marland Kitchen Hypertension   . Dyslipidemia     Prior Cardiac  Evaluation and Past Surgical History: Past Surgical History  Procedure Laterality Date  . Cardiac catheterization    . Cardiac catheterization N/A 01/11/2015    Procedure: Left Heart Cath and Coronary Angiography;  Surgeon: Marykay Lex, MD;  Location: [facility] INVASIVE CV LAB;  Service: Cardiovascular;  Laterality: N/A;  . Cardiac catheterization  01/11/2015    Procedure: Coronary Stent Intervention;  Surgeon: Marykay Lex, MD;  Location: [facility] INVASIVE CV LAB;  Service: Cardiovascular;;  . Cardiac catheterization  01/11/2015    Procedure: Coronary Balloon Angioplasty;  Surgeon: Marykay Lex, MD;  Location: [facility] INVASIVE CV LAB;  Service: Cardiovascular;;  . Cardiac catheterization N/A 01/12/2015    Procedure: Left Heart Cath and Coronary Angiography;  Surgeon: Lyn Records, MD;  Location: [facility] INVASIVE CV LAB;  Service: Cardiovascular;  Laterality: N/A;  . Angioplasty  01/12/2015    Procedure: Angioplasty;  Surgeon: Lyn Records, MD;  Location: [facility] INVASIVE CV LAB;  Service: Cardiovascular;;    Allergies  Allergen Reactions  . 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  . Other Other (See Comments)    Sensitive to the electrodes  . Shellfish-Derived Products Other (See Comments)    Crabs, lobster, crayfish  . Vancomycin     REACTION: severe rash/hives  . Epinephrine Palpitations    Palpitations and involuntary body jerking    Current Outpatient Prescriptions  Medication Sig Dispense Refill  . acetaminophen (TYLENOL) 500 MG tablet Take 500 mg by mouth every 6 (six) hours as needed for headache.    Marland Kitchen aspirin EC 81 MG EC tablet Take 1 tablet (81 mg total) by mouth daily.    Marland Kitchen atorvastatin (LIPITOR) 80 MG tablet Take 1 tablet (80 mg total) by mouth daily. 30 tablet 6  . cholecalciferol (VITAMIN D) 1000 UNITS tablet Take 1,000 Units by mouth daily.    . clobetasol cream (TEMOVATE) 0.05 % Apply 1 application  topically 2 (two) times daily.    . furosemide (LASIX) 20 MG tablet Take 0.5 tablets (10 mg total) by mouth as needed. 30 tablet 6  . isosorbide mononitrate (IMDUR) 30 MG 24 hr tablet Take 1 tablet (30 mg total) by mouth daily. 90 tablet 1  . metoprolol tartrate (LOPRESSOR) 25 MG tablet Take 0.5 tablets (12.5 mg total) by mouth 2 (two) times daily. 30 tablet 6  . nitroGLYCERIN (NITROSTAT) 0.4 MG SL tablet Place 1 tablet (0.4 mg total) under the tongue every 5 (five) minutes x 3 doses as needed for chest pain. 25 tablet 3  . pantoprazole (PROTONIX) 40 MG tablet TAKE 1 TABLET BY MOUTH DAILY AT 12 NOON. 30 tablet 6  . ranolazine (RANEXA) 500 MG 12 hr tablet Take 1 tablet (500 mg total) by mouth 2 (two)  times daily. 60 tablet 6   No current facility-administered medications for this visit.   Social History   Social History  . Marital Status: Widowed    Spouse Name: N/A  . Number of Children: N/A  . Years of Education: N/A   Occupational History  . unemployed    Social History Main Topics  . Smoking status: Former Smoker    Types: Cigarettes  . Smokeless tobacco: None  . Alcohol Use: 8.4 oz/week    14 Glasses of wine per week     Comment: 2 glass of wine a night   . Drug Use: No  . Sexual Activity: Not Currently    Birth Control/ Protection: Post-menopausal   Other Topics Concern  . None   Social History Narrative   She is under a significant amount of stress socially.    Her husband committed suicide several years ago - she continues to have difficulty with coping.  -- she quit smoking following her MRI.   Family History: family history includes Alzheimer's disease in her father; Heart Problems in her sister; Heart disease in her maternal grandmother; Hyperlipidemia in her brother and mother; Hypotension in her sister; Mental illness in her sister; Peripheral vascular disease in her mother; Schizophrenia in her sister.  ROS: A comprehensive Review of Systems - was  performed Review of Systems  Constitutional: Negative for malaise/fatigue.  HENT: Negative for nosebleeds.   Eyes: Negative for blurred vision.  Cardiovascular: Negative for claudication.  Gastrointestinal: Negative for blood in stool and melena.  Genitourinary: Negative for hematuria.  Musculoskeletal: Positive for joint pain (Shoulder pain). Negative for falls.  Endo/Heme/Allergies: Bruises/bleeds easily (Both arms. Abnormal EKG lead site).  Psychiatric/Behavioral: The patient is nervous/anxious.   All other systems reviewed and are negative.   PHYSICAL EXAM BP 120/70 mmHg  Pulse 57  Ht  (1.702 m)  Wt 175 lb 1.6 oz (79.425 kg)  BMI 27.42 kg/m2 General appearance: alert, cooperative, appears stated age, no distress and Relatively healthy appearing. Somewhat anxious, but notably improved from previous descriptions Neck: no adenopathy, no carotid bruit, no JVD and supple, symmetrical, trachea midline Lungs: clear to auscultation bilaterally, normal percussion bilaterally and Nonlabored, good air movement Heart: regular rate and rhythm, S1, S2 normal, no murmur, click, rub or gallop and normal apical impulse Abdomen: soft, non-tender; bowel sounds normal; no masses,  no organomegaly Extremities: No clubbing or cyanosis. Mild trace to 1+ puffy edema in the ankles bilaterally. Pulses: 2+ and symmetric Skin: Skin color, texture, turgor normal. No rashes or lesions Neurologic: Alert and oriented X 3, normal strength and tone. Normal symmetric reflexes. Normal coordination and gait She has significant violaceous purpuric type bruising at the sites of her EKG leads. -- We will list the adhesion tape as an allergy.    Adult ECG Report  Rate: 57 ;  Rhythm: sinus bradycardia and Inferior infarct, age undetermined with persistent evolving inferior T-wave inversions with Q waves.  QRS Axis: -22 ;  PR Interval:  154 ;  QRS Duration: 78 ; QTc: 414; Voltages: borderline low voltage   Conduction Disturbances: none;  Other Abnormalities: Evolving ST and T-wave changes with T-wave inversions in the inferior leads. Inferior Q waves consistent with inferior infarct age undetermined.  Narrative Interpretation: relatively stable EKG with less prominent T-wave inversions in leads II and aVF.  Recent Labs:  Lab Results  Component Value Date   CHOL 147 01/12/2015   HDL 58 01/12/2015   LDLCALC 75 01/12/2015  TRIG 69 01/12/2015   CHOLHDL 2.5 01/12/2015   Lab Results  Component Value Date   CREATININE 1.59* 02/09/2015   Lab Results  Component Value Date   K 4.5 02/09/2015    ASSESSMENT / PLAN: Edriana is finally starting to come around and recovery from her MI. Most of today's visit was to bleeding with a she'll discussion is about her improved health. We also discussed her intermittent swelling and mild heart failure symptoms. She is doing well at cardiac rehabilitation and hopes to maybe consider the maintenance program.  I spent well over 40 minutes with the patient reviewing her hospital stay as well as the intervening time between her last discharge and this visit. She also has new complaints today. Over one half of the time the patient was in counseling about goals of care and expectations.  Problem List Items Addressed This Visit    Bilateral lower extremity edema    It is hard to know if her edema is related to diastolic heart failure but this really did not note much the way of PND and orthopnea.  She was somewhat dyspneic. I wouldn't be surprised if she has some diastolic dysfunction. My suspicion is that over the weekend she may have had some dietary indiscretion while at the beach. She is on Lasix on when necessary. My recommendation would be to use a full tablet on as-needed basis as opposed to 1/2 tablet  She is following her edema as well as daily weights.  MAY USE FULL STRENGTH LASIX IF YOU GAIN 3 LBS OR MORE GREATER THAN YOUR DRY WEIGHT. TAKE 40 MG THE  FIRST DAY  AND THEN TAKE 20 MG DAILY UNTIL WEIGHT RETURNS TO DRY WEIGHT.      CAD S/P PCI of the ostial and mid/distal RCA (Chronic)    Very difficult to do to PCI of the RCA. She now has a stent hanging out of the ostium of the RCA because of the ostial lesion. Thankfully there is no notable ischemia noted on her post MI Myoview. She does have some nonocclusive disease in the other vessels that are less likely to be ischemic in nature. She probably still has some mild microvascular ischemia. Now on Effient plus aspirin. With her bruising as it is okay for her to hold aspirin for a few days. My suspicion would be that after 6 months we can probably safely stop her aspirin.    Based on the complex PCI, I would not completely stop antiplatelet therapy with either Effient or Plavix. Can likely convert to Plavix after one year  She is on high-dose statin and is due to have lipids checked soon. We may get him down off of that. She is on low-dose beta blocker which I would not titrate up now based on her mild orthostatic dizziness.  She is on low-dose Imdur plus her Ranexa for her anginal type symptoms -- I suspect that, see her in followup, we can probably consider stopping the Ranexa.      Essential hypertension    Her blood pressure does get up some while she is at artery, but not the worried about. With her orthostatic dizziness that has been noted, I don't plan to increase her Lopressor any more at this particular time.      Relevant Orders   EKG 12-Lead (Completed)   Hyperlipidemia with target LDL less than 70 (Chronic)    On high dose statin. If her PCP did not check her lipid canal next number  seizure, we would need to have a followup set of labs.  Her lipids look pretty good at the time of her MI.  It would be nice to see the level now that she's been on therapy. I would like to bring her down off the 80 mg atorvastatin down to 40 mg if her lipids are stable.      Relevant Orders   EKG  12-Lead (Completed)   ST elevation myocardial infarction (STEMI) of inferior wall - Primary    Some evidence of an inferior infarct noted on stress test. Does not seem to be a large infarct with no effect on wall motion and ejection fraction. Probably because of the tortuosity of the vessel making it very difficult to get down to the distal branches that were partially occluded with distal embolization, she may have had prolonged distal infarct. Our suspected of renal catheterization now which show widely patent distal vessels.  She probably has mild diastolic dysfunction on echocardiogram.      Relevant Orders   EKG 12-Lead (Completed)      No orders of the defined types were placed in this encounter.     Patient Instructions: MAY USE FULL STRENGTH LASIX IF YOU GAIN 3 LBS OR MORE GREATER THAN YOUR DRY WEIGHT. TAKE 40 MG THE FIRST DAY  AND THEN TAKE 20 MG DAILY UNTIL WEIGHT RETURNS TO DRY WEIGHT.  MAY HOLD ASPIRIN  FOR 3-4 DAYS IF ACTIVE  BRUISING OCCUR.   Followup: January 2017    Marykay Lex, M.D., M.S. Interventional Cardiologist   Pager # [phone]

## 2015-04-20 NOTE — Patient Instructions (Signed)
MAY USE FULL STRENGTH LASIX IF YOU GAIN 3 LBS OR MORE GREATER THAN YOUR DRY WEIGHT. TAKE 40 MG THE FIRST DAY  AND THEN TAKE 20 MG DAILY UNTIL WEIGHT RETURNS TO DRY WEIGHT.  MAY HOLD ASPIRIN  FOR 3-4 DAYS IF ACTIVE  BRUISING OCCUR.  Your physician recommends that you schedule a follow-up appointment in  JAN 2017 WITH DR HARDING - 30 MIN APPOINTMENT.

## 2015-04-21 ENCOUNTER — Encounter: Payer: Self-pay | Admitting: Cardiology

## 2015-04-21 ENCOUNTER — Encounter (HOSPITAL_COMMUNITY)
Admission: RE | Admit: 2015-04-21 | Discharge: 2015-04-21 | Disposition: A | Discharge status: Home / Self Care | Payer: BLUE CROSS/BLUE SHIELD | Source: Ambulatory Visit | Attending: Cardiology | Admitting: Cardiology

## 2015-04-21 DIAGNOSIS — E785 Hyperlipidemia, unspecified: Secondary | ICD-10-CM | POA: Insufficient documentation

## 2015-04-21 DIAGNOSIS — Z48812 Encounter for surgical aftercare following surgery on the circulatory system: Secondary | ICD-10-CM | POA: Diagnosis not present

## 2015-04-21 NOTE — Progress Notes (Signed)
Creed Copper 64 y.o. female Nutrition Note Spoke with pt.  Nutrition Survey reviewed with pt. Pt is following Step 2 of the Therapeutic Lifestyle Changes diet. Pt is pre-diabetic according to her most recent A1c. Pre-diabetes discussed.. Pt expressed understanding of the information reviewed. Pt aware of nutrition education classes offered and plans on attending 1 nutrition class. Lab Results  Component Value Date   HGBA1C 5.7* 01/11/2015   Wt Readings from Last 3 Encounters:  04/20/15 175 lb 1.6 oz (79.425 kg)  03/11/15 173 lb 1 oz (78.5 kg)  03/08/15 170 lb (77.111 kg)   Nutrition Diagnosis ? Food-and nutrition-related knowledge deficit related to lack of exposure to information as related to diagnosis of: ? CVD ? Pre-DM ? Overweight related to excessive energy intake as evidenced by a BMI of 27.9  Nutrition Intervention ? Benefits of adopting Therapeutic Lifestyle Changes discussed when Medficts reviewed. ? Pt to attend the Portion Distortion class ? Pt to attend the  ? Nutrition I class                        ? Nutrition II class ? Pt given handouts for: ? Nutrition I class ? Nutrition II class ? Continue client-centered nutrition education by RD, as part of interdisciplinary care.  Goal(s) ? Pt to identify food quantities necessary to achieve: ? wt loss to a goal wt of 148-166 lb (67.6-75.8 kg) at graduation from cardiac rehab.  ? Pt to describe the benefit of including fruits, vegetables, whole grains, and low-fat dairy products in a heart healthy meal plan.  Monitor and Evaluate progress toward nutrition goal with team.  Mickle Plumb, M.Ed, RD, LDN, CDE 04/21/2015 3:51 PM

## 2015-04-22 DIAGNOSIS — R600 Localized edema: Secondary | ICD-10-CM | POA: Insufficient documentation

## 2015-04-22 NOTE — Assessment & Plan Note (Signed)
It is hard to know if her edema is related to diastolic heart failure but this really did not note much the way of PND and orthopnea.  She was somewhat dyspneic. I wouldn't be surprised if she has some diastolic dysfunction. My suspicion is that over the weekend she may have had some dietary indiscretion while at the beach. She is on Lasix on when necessary. My recommendation would be to use a full tablet on as-needed basis as opposed to 1/2 tablet  She is following her edema as well as daily weights.  MAY USE FULL STRENGTH LASIX IF YOU GAIN 3 LBS OR MORE GREATER THAN YOUR DRY WEIGHT. TAKE 40 MG THE FIRST DAY  AND THEN TAKE 20 MG DAILY UNTIL WEIGHT RETURNS TO DRY WEIGHT.

## 2015-04-22 NOTE — Assessment & Plan Note (Signed)
Her blood pressure does get up some while she is at artery, but not the worried about. With her orthostatic dizziness that has been noted, I don't plan to increase her Lopressor any more at this particular time.

## 2015-04-22 NOTE — Assessment & Plan Note (Signed)
Very difficult to do to PCI of the RCA. She now has a stent hanging out of the ostium of the RCA because of the ostial lesion. Thankfully there is no notable ischemia noted on her post MI Myoview. She does have some nonocclusive disease in the other vessels that are less likely to be ischemic in nature. She probably still has some mild microvascular ischemia. Now on Effient plus aspirin. With her bruising as it is okay for her to hold aspirin for a few days. My suspicion would be that after 6 months we can probably safely stop her aspirin.    Based on the complex PCI, I would not completely stop antiplatelet therapy with either Effient or Plavix. Can likely convert to Plavix after one year  She is on high-dose statin and is due to have lipids checked soon. We may get him down off of that. She is on low-dose beta blocker which I would not titrate up now based on her mild orthostatic dizziness.  She is on low-dose Imdur plus her Ranexa for her anginal type symptoms -- I suspect that, see her in followup, we can probably consider stopping the Ranexa.

## 2015-04-22 NOTE — Assessment & Plan Note (Signed)
Some evidence of an inferior infarct noted on stress test. Does not seem to be a large infarct with no effect on wall motion and ejection fraction. Probably because of the tortuosity of the vessel making it very difficult to get down to the distal branches that were partially occluded with distal embolization, she may have had prolonged distal infarct. Our suspected of renal catheterization now which show widely patent distal vessels.  She probably has mild diastolic dysfunction on echocardiogram.

## 2015-04-22 NOTE — Assessment & Plan Note (Signed)
On high dose statin. If her PCP did not check her lipid canal next number seizure, we would need to have a followup set of labs.  Her lipids look pretty good at the time of her MI.  It would be nice to see the level now that she's been on therapy. I would like to bring her down off the 80 mg atorvastatin down to 40 mg if her lipids are stable.

## 2015-04-23 ENCOUNTER — Encounter (HOSPITAL_COMMUNITY)
Admission: RE | Admit: 2015-04-23 | Discharge: 2015-04-23 | Disposition: A | Discharge status: Home / Self Care | Payer: BLUE CROSS/BLUE SHIELD | Source: Ambulatory Visit | Attending: Cardiology | Admitting: Cardiology

## 2015-04-26 ENCOUNTER — Encounter (HOSPITAL_COMMUNITY)
Admission: RE | Admit: 2015-04-26 | Discharge: 2015-04-26 | Disposition: A | Discharge status: Home / Self Care | Payer: BLUE CROSS/BLUE SHIELD | Source: Ambulatory Visit | Attending: Cardiology | Admitting: Cardiology

## 2015-04-26 DIAGNOSIS — Z48812 Encounter for surgical aftercare following surgery on the circulatory system: Secondary | ICD-10-CM | POA: Diagnosis not present

## 2015-04-27 ENCOUNTER — Other Ambulatory Visit: Payer: Self-pay | Admitting: Physician Assistant

## 2015-04-28 ENCOUNTER — Encounter (HOSPITAL_COMMUNITY)
Admission: RE | Admit: 2015-04-28 | Discharge: 2015-04-28 | Disposition: A | Discharge status: Home / Self Care | Payer: BLUE CROSS/BLUE SHIELD | Source: Ambulatory Visit | Attending: Cardiology | Admitting: Cardiology

## 2015-04-28 DIAGNOSIS — Z48812 Encounter for surgical aftercare following surgery on the circulatory system: Secondary | ICD-10-CM | POA: Diagnosis not present

## 2015-04-28 NOTE — Telephone Encounter (Signed)
Pantoprazole refilled #[phone] with 6 refills 03/15/2015

## 2015-04-30 ENCOUNTER — Encounter (HOSPITAL_COMMUNITY): Payer: BLUE CROSS/BLUE SHIELD

## 2015-04-30 ENCOUNTER — Telehealth: Payer: Self-pay | Admitting: Cardiology

## 2015-04-30 NOTE — Telephone Encounter (Signed)
Instructed patient to hold her second lopressor dose today.  Told her to monitor her HR and BP prior to lopressor dose and if heart rate is less than 50, BP top number is less tan 100 and she is dizzy she needs to hold that dose of Lopressor  If symptoms become worse or develops shortness of breath or chest pain she needs to seek medical care  Routed to Dr. Antoine Poche

## 2015-04-30 NOTE — Telephone Encounter (Signed)
She can reduce the beta blocker to once daily and call if she continues to have bradycardia or symptoms.

## 2015-04-30 NOTE — Telephone Encounter (Signed)
Pt say her blood pressure is low today 105/48 and pulse rate was 50,that was her first check,took it 5 minutes later it was 109/50 and pulse was 48. Rehab told her to call here,she was supposed to have gone there today.

## 2015-04-30 NOTE — Telephone Encounter (Signed)
Patient said she had some tingling of both legs today that made her feel it was necessary to take her blood pressure  She took her blood pressure twice, 5 minutes apart: 105/48 P 50, 109/50 P 48   Continues taking Imdur 30 mg and Lopressor 12.5 mg bid.  She has not taken her as needed lasix in > 24 hours  Feels weak.  Dr. Herbie Baltimore off today.  Routed to Dr. Antoine Poche

## 2015-05-03 ENCOUNTER — Encounter (HOSPITAL_COMMUNITY): Admission: RE | Admit: 2015-05-03 | Payer: BLUE CROSS/BLUE SHIELD | Source: Ambulatory Visit

## 2015-05-04 NOTE — Telephone Encounter (Signed)
Patient said that she is going to reduce her beta blocker to once a day.  She decreased it to once a day over the weekend resulting in heart rate in the 60's and feeling much better

## 2015-05-04 NOTE — Telephone Encounter (Signed)
LVMTCB

## 2015-05-04 NOTE — Telephone Encounter (Signed)
OK to reduce to once daily. 

## 2015-05-05 ENCOUNTER — Encounter (HOSPITAL_COMMUNITY): Payer: BLUE CROSS/BLUE SHIELD

## 2015-05-07 ENCOUNTER — Encounter (HOSPITAL_COMMUNITY): Payer: BLUE CROSS/BLUE SHIELD

## 2015-05-10 ENCOUNTER — Telehealth (HOSPITAL_COMMUNITY): Payer: Self-pay | Admitting: Family Medicine

## 2015-05-10 ENCOUNTER — Encounter (HOSPITAL_COMMUNITY): Payer: BLUE CROSS/BLUE SHIELD

## 2015-05-12 ENCOUNTER — Encounter (HOSPITAL_COMMUNITY)
Admission: RE | Admit: 2015-05-12 | Discharge: 2015-05-12 | Disposition: A | Discharge status: Home / Self Care | Payer: BLUE CROSS/BLUE SHIELD | Source: Ambulatory Visit | Attending: Cardiology | Admitting: Cardiology

## 2015-05-12 ENCOUNTER — Telehealth: Payer: Self-pay | Admitting: Cardiology

## 2015-05-12 DIAGNOSIS — Z955 Presence of coronary angioplasty implant and graft: Secondary | ICD-10-CM | POA: Insufficient documentation

## 2015-05-12 DIAGNOSIS — I252 Old myocardial infarction: Secondary | ICD-10-CM | POA: Insufficient documentation

## 2015-05-12 DIAGNOSIS — Z48812 Encounter for surgical aftercare following surgery on the circulatory system: Secondary | ICD-10-CM | POA: Insufficient documentation

## 2015-05-12 NOTE — Telephone Encounter (Signed)
Spoke with Laura Herrera at cardiac rehab, pts weight iis up 1.5kg. The pt is c/o sl SOB. Pt told to take prn lasix.

## 2015-05-12 NOTE — Progress Notes (Signed)
Laura Herrera's weight is up 1.7 kg from her last exercise session on 04/28/2015.  Upon assessment lung fields are clear. Trace lower extremity edema noted, [name] reports that she feels a little short of breath. Patient advised to take her PRN furosemide. Dr Elissa Hefty office called and notfied. Will continue to monitor the patient throughout  the program.

## 2015-05-14 ENCOUNTER — Encounter (HOSPITAL_COMMUNITY)
Admission: RE | Admit: 2015-05-14 | Discharge: 2015-05-14 | Disposition: A | Discharge status: Home / Self Care | Payer: BLUE CROSS/BLUE SHIELD | Source: Ambulatory Visit | Attending: Cardiology | Admitting: Cardiology

## 2015-05-14 DIAGNOSIS — Z48812 Encounter for surgical aftercare following surgery on the circulatory system: Secondary | ICD-10-CM | POA: Diagnosis not present

## 2015-05-14 NOTE — Progress Notes (Addendum)
Pt weight unchanged today at cardaic rehab. Pt reports she took Lasix 20mg  PRN yesterday however did not see much results.  Pt c/o facial puffiness, more dyspnea than usual.  Denies pedal edema. Pt states she did not feel dizzy with lasix 20mg  and would like to continue at that dose.  pc to Dr. Elissa Hefty office, spoke to Harris, triage nurse,   Pt instructed to take lasix 20mg  x 3 per PRN orders.    Understanding verbalized.

## 2015-05-17 ENCOUNTER — Encounter (HOSPITAL_COMMUNITY)
Admission: RE | Admit: 2015-05-17 | Discharge: 2015-05-17 | Disposition: A | Discharge status: Home / Self Care | Payer: BLUE CROSS/BLUE SHIELD | Source: Ambulatory Visit | Attending: Cardiology | Admitting: Cardiology

## 2015-05-17 DIAGNOSIS — Z48812 Encounter for surgical aftercare following surgery on the circulatory system: Secondary | ICD-10-CM | POA: Diagnosis not present

## 2015-05-19 ENCOUNTER — Encounter (HOSPITAL_COMMUNITY)
Admission: RE | Admit: 2015-05-19 | Discharge: 2015-05-19 | Disposition: A | Discharge status: Home / Self Care | Payer: BLUE CROSS/BLUE SHIELD | Source: Ambulatory Visit | Attending: Cardiology | Admitting: Cardiology

## 2015-05-19 DIAGNOSIS — Z48812 Encounter for surgical aftercare following surgery on the circulatory system: Secondary | ICD-10-CM | POA: Diagnosis not present

## 2015-05-21 ENCOUNTER — Encounter (HOSPITAL_COMMUNITY)
Admission: RE | Admit: 2015-05-21 | Discharge: 2015-05-21 | Disposition: A | Discharge status: Home / Self Care | Payer: BLUE CROSS/BLUE SHIELD | Source: Ambulatory Visit | Attending: Cardiology | Admitting: Cardiology

## 2015-05-21 ENCOUNTER — Telehealth: Payer: Self-pay

## 2015-05-21 ENCOUNTER — Other Ambulatory Visit: Payer: Self-pay

## 2015-05-21 DIAGNOSIS — Z48812 Encounter for surgical aftercare following surgery on the circulatory system: Secondary | ICD-10-CM | POA: Diagnosis not present

## 2015-05-21 NOTE — Telephone Encounter (Signed)
Ranexa 500 MG samples for 1 month supply for patient.  Marykay Lex, MD at 04/20/2015 3:28 PM  ranolazine (RANEXA) 500 MG 12 hr tabletTake 1 tablet (500 mg total) by mouth 2 (two) times daily

## 2015-05-24 ENCOUNTER — Encounter (HOSPITAL_COMMUNITY): Payer: BLUE CROSS/BLUE SHIELD

## 2015-05-26 ENCOUNTER — Encounter (HOSPITAL_COMMUNITY): Payer: BLUE CROSS/BLUE SHIELD

## 2015-05-28 ENCOUNTER — Telehealth: Payer: Self-pay | Admitting: Cardiology

## 2015-05-28 ENCOUNTER — Encounter (HOSPITAL_COMMUNITY): Payer: BLUE CROSS/BLUE SHIELD

## 2015-05-28 NOTE — Telephone Encounter (Signed)
Spoke with pt, she will [facility] to the local [facility] to get enough to last until she gets back into town.

## 2015-05-28 NOTE — Telephone Encounter (Signed)
Pt called in stating that [place] and forgot her Ranexa at home and she only has 2 pills left. She would like to know what she should do.Please assist  Thanks

## 2015-05-31 ENCOUNTER — Encounter (HOSPITAL_COMMUNITY)
Admission: RE | Admit: 2015-05-31 | Discharge: 2015-05-31 | Disposition: A | Discharge status: Home / Self Care | Payer: BLUE CROSS/BLUE SHIELD | Source: Ambulatory Visit | Attending: Cardiology | Admitting: Cardiology

## 2015-05-31 DIAGNOSIS — Z48812 Encounter for surgical aftercare following surgery on the circulatory system: Secondary | ICD-10-CM | POA: Diagnosis not present

## 2015-06-02 ENCOUNTER — Encounter (HOSPITAL_COMMUNITY)
Admission: RE | Admit: 2015-06-02 | Discharge: 2015-06-02 | Disposition: A | Discharge status: Home / Self Care | Payer: BLUE CROSS/BLUE SHIELD | Source: Ambulatory Visit | Attending: Cardiology | Admitting: Cardiology

## 2015-06-02 DIAGNOSIS — Z48812 Encounter for surgical aftercare following surgery on the circulatory system: Secondary | ICD-10-CM | POA: Diagnosis not present

## 2015-06-04 ENCOUNTER — Encounter (HOSPITAL_COMMUNITY)
Admission: RE | Admit: 2015-06-04 | Discharge: 2015-06-04 | Disposition: A | Discharge status: Home / Self Care | Payer: BLUE CROSS/BLUE SHIELD | Source: Ambulatory Visit | Attending: Cardiology | Admitting: Cardiology

## 2015-06-04 DIAGNOSIS — Z48812 Encounter for surgical aftercare following surgery on the circulatory system: Secondary | ICD-10-CM | POA: Diagnosis not present

## 2015-06-07 ENCOUNTER — Encounter (HOSPITAL_COMMUNITY): Payer: BLUE CROSS/BLUE SHIELD

## 2015-06-07 ENCOUNTER — Telehealth (HOSPITAL_COMMUNITY): Payer: Self-pay | Admitting: Family Medicine

## 2015-06-09 ENCOUNTER — Encounter (HOSPITAL_COMMUNITY): Payer: BLUE CROSS/BLUE SHIELD

## 2015-06-11 ENCOUNTER — Encounter (HOSPITAL_COMMUNITY): Payer: BLUE CROSS/BLUE SHIELD

## 2015-06-14 ENCOUNTER — Encounter (HOSPITAL_COMMUNITY)
Admission: RE | Admit: 2015-06-14 | Discharge: 2015-06-14 | Disposition: A | Discharge status: Home / Self Care | Payer: BLUE CROSS/BLUE SHIELD | Source: Ambulatory Visit | Attending: Cardiology | Admitting: Cardiology

## 2015-06-14 DIAGNOSIS — I252 Old myocardial infarction: Secondary | ICD-10-CM | POA: Insufficient documentation

## 2015-06-14 DIAGNOSIS — Z955 Presence of coronary angioplasty implant and graft: Secondary | ICD-10-CM | POA: Diagnosis not present

## 2015-06-14 DIAGNOSIS — Z48812 Encounter for surgical aftercare following surgery on the circulatory system: Secondary | ICD-10-CM | POA: Diagnosis not present

## 2015-06-16 ENCOUNTER — Telehealth: Payer: Self-pay | Admitting: Cardiology

## 2015-06-16 ENCOUNTER — Encounter (HOSPITAL_COMMUNITY)
Admission: RE | Admit: 2015-06-16 | Discharge: 2015-06-16 | Disposition: A | Discharge status: Home / Self Care | Payer: BLUE CROSS/BLUE SHIELD | Source: Ambulatory Visit | Attending: Cardiology | Admitting: Cardiology

## 2015-06-16 DIAGNOSIS — Z48812 Encounter for surgical aftercare following surgery on the circulatory system: Secondary | ICD-10-CM | POA: Diagnosis not present

## 2015-06-16 NOTE — Telephone Encounter (Signed)
Called pt to inform her that we did not have any samples of the Ranexa 500 mg tablets and I offered her a application for patient assist. The application was mailed out on 06/16/15. I also advised the pt to call back at a later date to see if we have gotten any samples in for Ranexa 500 mg. I advised the pt that if she has any other problems, questions or concerns to call the office. Pt verbalized understanding.

## 2015-06-18 ENCOUNTER — Encounter (HOSPITAL_COMMUNITY)
Admission: RE | Admit: 2015-06-18 | Discharge: 2015-06-18 | Disposition: A | Discharge status: Home / Self Care | Payer: BLUE CROSS/BLUE SHIELD | Source: Ambulatory Visit | Attending: Cardiology | Admitting: Cardiology

## 2015-06-18 DIAGNOSIS — Z48812 Encounter for surgical aftercare following surgery on the circulatory system: Secondary | ICD-10-CM | POA: Diagnosis not present

## 2015-06-21 ENCOUNTER — Telehealth: Payer: Self-pay | Admitting: Cardiology

## 2015-06-21 ENCOUNTER — Encounter (HOSPITAL_COMMUNITY)
Admission: RE | Admit: 2015-06-21 | Discharge: 2015-06-21 | Disposition: A | Discharge status: Home / Self Care | Payer: BLUE CROSS/BLUE SHIELD | Source: Ambulatory Visit | Attending: Cardiology | Admitting: Cardiology

## 2015-06-21 ENCOUNTER — Telehealth: Payer: Self-pay | Admitting: Physician Assistant

## 2015-06-21 ENCOUNTER — Telehealth: Payer: Self-pay | Admitting: *Deleted

## 2015-06-21 DIAGNOSIS — Z48812 Encounter for surgical aftercare following surgery on the circulatory system: Secondary | ICD-10-CM | POA: Diagnosis not present

## 2015-06-21 NOTE — Telephone Encounter (Signed)
Pharmacy called to get Rx re-written that Laura Ducking, NP initially wrote.  NCM gave pharmacy [facility] phone number to contact as ER will not re-write Rx from cardiology office.

## 2015-06-21 NOTE — Telephone Encounter (Signed)
Pt calling for samples of Ranexa 500 mg tablets. I informed pt that I would leave a 2 week supply at the front desk for her and that she could call back later in the week to see if we have gotten more sample in. Pt verbalized understanding.

## 2015-06-21 NOTE — Telephone Encounter (Signed)
New Message  [facility] from [facility] calling to get new Rx that clarify pt's new dosage for Isorbide. Please call back and discuss.

## 2015-06-21 NOTE — Telephone Encounter (Signed)
Verbal auth for refill of IMDUR.

## 2015-06-23 ENCOUNTER — Encounter (HOSPITAL_COMMUNITY)
Admission: RE | Admit: 2015-06-23 | Discharge: 2015-06-23 | Disposition: A | Discharge status: Home / Self Care | Payer: BLUE CROSS/BLUE SHIELD | Source: Ambulatory Visit | Attending: Cardiology | Admitting: Cardiology

## 2015-06-23 ENCOUNTER — Telehealth: Payer: Self-pay | Admitting: Cardiology

## 2015-06-23 DIAGNOSIS — Z48812 Encounter for surgical aftercare following surgery on the circulatory system: Secondary | ICD-10-CM | POA: Diagnosis not present

## 2015-06-23 NOTE — Telephone Encounter (Signed)
Spoke to [employer]  At DENTAL office ( Dr Clare Charon)  Asking if patient needs any pre -medication or precautions for any dental work - like filling ,extraction or deep cleaning     Aware will defer to Dr Herbie Baltimore and contact with answer

## 2015-06-23 NOTE — Telephone Encounter (Signed)
Spoke to patient  She states she taking EFFIENT  at present time RN informed patient of phone call from dentist office. She verbalized understanding. Patient also asked if she can have refill of Xanax.  RN informed her to contact primary for refill. She verbalized understanding.

## 2015-06-23 NOTE — Telephone Encounter (Signed)
Informed office   routed /fax of answer question to dentist office [phone]

## 2015-06-23 NOTE — Telephone Encounter (Signed)
Per The Answering Service: Please call regarding medication.

## 2015-06-23 NOTE — Telephone Encounter (Signed)
No pre-medications or precautions.  She is on antiplatelet Rx for her multiple stents from June --> would prefer not to hold this treatment.  Can hold ASA, but not Effient/Plavix.  For some reason neither Effient or Plavix is listed on her Med list - need to make sure that she is taking one of these.   Marykay Lex, MD'

## 2015-06-25 ENCOUNTER — Telehealth (HOSPITAL_COMMUNITY): Payer: Self-pay | Admitting: Family Medicine

## 2015-06-25 ENCOUNTER — Encounter (HOSPITAL_COMMUNITY): Payer: BLUE CROSS/BLUE SHIELD

## 2015-06-28 ENCOUNTER — Encounter (HOSPITAL_COMMUNITY)
Admission: RE | Admit: 2015-06-28 | Discharge: 2015-06-28 | Disposition: A | Discharge status: Home / Self Care | Payer: BLUE CROSS/BLUE SHIELD | Source: Ambulatory Visit | Attending: Cardiology | Admitting: Cardiology

## 2015-06-28 DIAGNOSIS — Z48812 Encounter for surgical aftercare following surgery on the circulatory system: Secondary | ICD-10-CM | POA: Diagnosis not present

## 2015-06-30 ENCOUNTER — Encounter (HOSPITAL_COMMUNITY)
Admission: RE | Admit: 2015-06-30 | Discharge: 2015-06-30 | Disposition: A | Discharge status: Home / Self Care | Payer: BLUE CROSS/BLUE SHIELD | Source: Ambulatory Visit | Attending: Cardiology | Admitting: Cardiology

## 2015-06-30 DIAGNOSIS — Z48812 Encounter for surgical aftercare following surgery on the circulatory system: Secondary | ICD-10-CM | POA: Diagnosis not present

## 2015-07-05 ENCOUNTER — Telehealth (HOSPITAL_COMMUNITY): Payer: Self-pay | Admitting: *Deleted

## 2015-07-05 ENCOUNTER — Encounter (HOSPITAL_COMMUNITY): Admission: RE | Admit: 2015-07-05 | Payer: BLUE CROSS/BLUE SHIELD | Source: Ambulatory Visit

## 2015-07-07 ENCOUNTER — Encounter (HOSPITAL_COMMUNITY): Payer: BLUE CROSS/BLUE SHIELD

## 2015-07-09 ENCOUNTER — Encounter (HOSPITAL_COMMUNITY): Payer: BLUE CROSS/BLUE SHIELD

## 2015-07-12 ENCOUNTER — Telehealth (HOSPITAL_COMMUNITY): Payer: Self-pay | Admitting: Family Medicine

## 2015-07-12 ENCOUNTER — Encounter (HOSPITAL_COMMUNITY): Payer: BLUE CROSS/BLUE SHIELD

## 2015-07-14 ENCOUNTER — Encounter (HOSPITAL_COMMUNITY): Payer: BLUE CROSS/BLUE SHIELD

## 2015-07-16 ENCOUNTER — Encounter (HOSPITAL_COMMUNITY): Payer: BLUE CROSS/BLUE SHIELD

## 2015-07-22 ENCOUNTER — Other Ambulatory Visit: Payer: Self-pay | Admitting: Nurse Practitioner

## 2015-07-23 ENCOUNTER — Telehealth: Payer: Self-pay | Admitting: *Deleted

## 2015-07-23 NOTE — Telephone Encounter (Signed)
needed ranexa samples, placed a month at the front desk, pt aware

## 2015-07-23 NOTE — Telephone Encounter (Signed)
Rx(s) sent to pharmacy electronically.  

## 2015-08-23 ENCOUNTER — Encounter (HOSPITAL_COMMUNITY)
Admission: RE | Admit: 2015-08-23 | Discharge: 2015-08-23 | Disposition: A | Discharge status: Home / Self Care | Payer: Self-pay | Source: Ambulatory Visit | Attending: Cardiology | Admitting: Cardiology

## 2015-08-23 DIAGNOSIS — I2119 ST elevation (STEMI) myocardial infarction involving other coronary artery of inferior wall: Secondary | ICD-10-CM | POA: Insufficient documentation

## 2015-08-23 DIAGNOSIS — Z9861 Coronary angioplasty status: Secondary | ICD-10-CM | POA: Insufficient documentation

## 2015-08-23 DIAGNOSIS — I2510 Atherosclerotic heart disease of native coronary artery without angina pectoris: Secondary | ICD-10-CM | POA: Insufficient documentation

## 2015-08-25 ENCOUNTER — Encounter (HOSPITAL_COMMUNITY)
Admission: RE | Admit: 2015-08-25 | Discharge: 2015-08-25 | Disposition: A | Discharge status: Home / Self Care | Payer: Self-pay | Source: Ambulatory Visit | Attending: Cardiology | Admitting: Cardiology

## 2015-08-25 ENCOUNTER — Encounter: Payer: Self-pay | Admitting: Cardiology

## 2015-08-27 ENCOUNTER — Encounter (HOSPITAL_COMMUNITY)
Admission: RE | Admit: 2015-08-27 | Discharge: 2015-08-27 | Disposition: A | Discharge status: Home / Self Care | Payer: Self-pay | Source: Ambulatory Visit | Attending: Cardiology | Admitting: Cardiology

## 2015-08-30 ENCOUNTER — Telehealth: Payer: Self-pay | Admitting: *Deleted

## 2015-08-30 ENCOUNTER — Encounter (HOSPITAL_COMMUNITY): Admission: RE | Admit: 2015-08-30 | Payer: Self-pay | Source: Ambulatory Visit

## 2015-09-01 ENCOUNTER — Encounter (HOSPITAL_COMMUNITY): Payer: Self-pay

## 2015-09-03 ENCOUNTER — Encounter (HOSPITAL_COMMUNITY): Payer: Self-pay

## 2015-09-06 ENCOUNTER — Encounter (HOSPITAL_COMMUNITY)
Admission: RE | Admit: 2015-09-06 | Discharge: 2015-09-06 | Disposition: A | Discharge status: Home / Self Care | Payer: Self-pay | Source: Ambulatory Visit | Attending: Cardiology | Admitting: Cardiology

## 2015-09-07 NOTE — Telephone Encounter (Signed)
SAMPLES AT FRONT DESK

## 2015-09-08 ENCOUNTER — Encounter (HOSPITAL_COMMUNITY)
Admission: RE | Admit: 2015-09-08 | Discharge: 2015-09-08 | Disposition: A | Discharge status: Home / Self Care | Payer: Self-pay | Source: Ambulatory Visit | Attending: Cardiology | Admitting: Cardiology

## 2015-09-08 DIAGNOSIS — I2119 ST elevation (STEMI) myocardial infarction involving other coronary artery of inferior wall: Secondary | ICD-10-CM | POA: Insufficient documentation

## 2015-09-08 DIAGNOSIS — Z9861 Coronary angioplasty status: Secondary | ICD-10-CM | POA: Insufficient documentation

## 2015-09-08 DIAGNOSIS — I2510 Atherosclerotic heart disease of native coronary artery without angina pectoris: Secondary | ICD-10-CM | POA: Insufficient documentation

## 2015-09-10 ENCOUNTER — Encounter (HOSPITAL_COMMUNITY)
Admission: RE | Admit: 2015-09-10 | Discharge: 2015-09-10 | Disposition: A | Discharge status: Home / Self Care | Payer: Self-pay | Source: Ambulatory Visit | Attending: Cardiology | Admitting: Cardiology

## 2015-09-13 ENCOUNTER — Encounter (HOSPITAL_COMMUNITY): Payer: Self-pay

## 2015-09-14 ENCOUNTER — Ambulatory Visit (INDEPENDENT_AMBULATORY_CARE_PROVIDER_SITE_OTHER): Payer: BLUE CROSS/BLUE SHIELD | Admitting: Cardiology

## 2015-09-14 ENCOUNTER — Encounter: Payer: Self-pay | Admitting: Cardiology

## 2015-09-14 VITALS — BP 136/78 | HR 54 | Ht 67.0 in | Wt 174.6 lb

## 2015-09-14 DIAGNOSIS — I10 Essential (primary) hypertension: Secondary | ICD-10-CM

## 2015-09-14 DIAGNOSIS — E785 Hyperlipidemia, unspecified: Secondary | ICD-10-CM

## 2015-09-14 DIAGNOSIS — I2510 Atherosclerotic heart disease of native coronary artery without angina pectoris: Secondary | ICD-10-CM | POA: Diagnosis not present

## 2015-09-14 DIAGNOSIS — I2119 ST elevation (STEMI) myocardial infarction involving other coronary artery of inferior wall: Secondary | ICD-10-CM

## 2015-09-14 DIAGNOSIS — R002 Palpitations: Secondary | ICD-10-CM | POA: Diagnosis not present

## 2015-09-14 DIAGNOSIS — Z9861 Coronary angioplasty status: Secondary | ICD-10-CM

## 2015-09-14 DIAGNOSIS — T148XXA Other injury of unspecified body region, initial encounter: Secondary | ICD-10-CM

## 2015-09-14 DIAGNOSIS — T148 Other injury of unspecified body region: Secondary | ICD-10-CM

## 2015-09-14 MED ORDER — METOPROLOL SUCCINATE ER 25 MG PO TB24: 12.5000 mg | ORAL_TABLET | Freq: Every day | ORAL | Status: DC

## 2015-09-14 NOTE — Progress Notes (Signed)
PCP: Darrow Bussing, MD  Clinic Note: Chief Complaint  Patient presents with  . Follow-up    4 month//STEMI//pt c/o chest pain 2-3 times since she was here last, SOB on exertion, dizziness when she gets up too quickly, and swelling in bilateral ankles--takes 1 tab LASIX 20mg  prn, makes her dizzy when she takes it  . Coronary Artery Disease    Status post inferior STEMI; PCI to RCA    HPI: Laura Herrera is a 65 y.o. female with a PMH below who presents today for 4-5 month f/u of CAD-STEMI/PCI ro RCA  Inferior STEMI 01/11/2015 -> severe ostial RCA mid RCA disease 100% occlusion in the mid vessel. Significant thrombus with distal embolization -->   2 Promus Premier DES stents: 2.25 mm x 24 mm covering proximal and mid lesions with 2.75 mm x 12 mm stent in the ostial proximal segment overlapping.  PTCA of distal RCA. -->   Relook cath with patent stents and PTCA site heavy thrombus noted.  Myoview 03/08/2015: LOW risk. Medium sized mild intensity partially reversible defect in the basal inferior, basal inferolateral and mid inferolateral wall suggestive of subendocardial MI in the inferior wall. No ischemia. EF greater than 65%.  Completed 3 month card rehabilitation in September. Began maintenance program shortly thereafter.  Laura Herrera was last seen on 04/10/2015.  Recent Hospitalizations: None  Studies Reviewed: None  Interval History: She presents here today doing relatively well. No significant symptoms. She is able to do her cardiac rehabilitation without any anginal symptoms. She has had maybe 2 or 3 episodes of chest discomfort since last visit. Not necessarily exertional in nature. Very short fleeting symptoms.  She may have a little bit of exertional dyspnea when she is trying to push it, but has not had any trouble with rehabilitation. She does note a couple episodes a week of palpitations lasting less than a minute. She has some mild motion edema and takes when  necessary Lasix may be once or twice a week. This does make a little bit dizzy. She had some positional dizziness but no syncope or near syncope.   No chest pain or shortness of breath with rest or exertion. No PND, or orthopnea.  No syncope/near syncope, or TIA/amaurosis fugax symptoms. No melena, hematochezia, hematuria, or epstaxis; but does note significant bruising  No claudication.  ROS: A comprehensive was performed. Review of Systems  Constitutional: Negative for weight loss and malaise/fatigue.  HENT: Negative for nosebleeds.   Eyes: Negative for blurred vision.  Respiratory: Positive for cough (Not recently, but she had a cough about that long ago.). Negative for shortness of breath and wheezing.   Cardiovascular: Positive for palpitations (not infrequent. May be 1-2 times a week. Short lived). Negative for claudication.  Gastrointestinal: Negative for blood in stool and melena.  Genitourinary: Negative for hematuria.  Neurological: Positive for dizziness (Some positional; some vertigo). Negative for focal weakness, weakness and headaches.  Endo/Heme/Allergies: Bruises/bleeds easily.  Psychiatric/Behavioral: Positive for memory loss (Noting short-term memory difficulties. Concerned about statin effect). The patient is nervous/anxious (notably less anxious.).   All other systems reviewed and are negative.   Past Medical History  Diagnosis Date  . Psoriasis   . MRSA infection     h/o MRSA infection in the right foot  . Anxiety   . Depression   . Insomnia   . PONV (postoperative nausea and vomiting)   . Coronary artery disease     a. 01/2015 Inf STEMI/PCI: RCA 80ost (2.75x12  Promus Premier DES), 100p (2.5x24 Promus Premier DES), 70p/m (2.5x24 Promus Premier DES), 90d (PTCA);  b. 01/2015 Relook Cath: LM nl, LAD 68m, D1 min irregs, LCX nl- L->R collats,  OM1 mod dzs, OM2 mild dizs, RCA 10ost, heavy thrombus p/m, 10d->Aggrastat;  c. 01/2015 Echo: EF 60-65%, no rwma, mod AI, PASP  .  Marland Kitchen Hypertension   . Dyslipidemia     Past Surgical History  Procedure Laterality Date  . Cardiac catheterization    . Cardiac catheterization N/A 01/11/2015    Procedure: Left Heart Cath and Coronary Angiography;  Surgeon: Marykay Lex, MD;  Location: [facility] INVASIVE CV LAB;  Service: Cardiovascular;  Laterality: N/A;  . Cardiac catheterization  01/11/2015    Procedure: Coronary Stent Intervention;  Surgeon: Marykay Lex, MD;  Location: [facility] INVASIVE CV LAB;  Service: Cardiovascular;;  . Cardiac catheterization  01/11/2015    Procedure: Coronary Balloon Angioplasty;  Surgeon: Marykay Lex, MD;  Location: [facility] INVASIVE CV LAB;  Service: Cardiovascular;;  . Cardiac catheterization N/A 01/12/2015    Procedure: Left Heart Cath and Coronary Angiography;  Surgeon: Lyn Records, MD;  Location: [facility] INVASIVE CV LAB;  Service: Cardiovascular;  Laterality: N/A;  . Angioplasty  01/12/2015    Procedure: Angioplasty;  Surgeon: Lyn Records, MD;  Location: [facility] INVASIVE CV LAB;  Service: Cardiovascular;;    Prior to Admission medications   Medication Sig Start Date End Date Taking? Authorizing Provider  acetaminophen (TYLENOL) 500 MG tablet Take 500 mg by mouth every 6 (six) hours as needed for headache.   Yes Historical Provider, MD  aspirin EC 81 MG EC tablet Take 1 tablet (81 mg total) by mouth daily. 01/17/15  Yes Ok Anis, NP  atorvastatin (LIPITOR) 80 MG tablet TAKE 1 TABLET BY MOUTH EVERY DAY 07/23/15  Yes Marykay Lex, MD  cholecalciferol (VITAMIN D) 1000 UNITS tablet Take 1,000 Units by mouth daily.   Yes Historical Provider, MD  clobetasol cream (TEMOVATE) 0.05 % Apply 1 application topically 2 (two) times daily.   Yes Historical Provider, MD  EFFIENT 10 MG TABS tablet Take 10 mg by mouth daily. 05/22/15  Yes Historical Provider, MD  furosemide (LASIX) 20 MG tablet Take 0.5 tablets (10 mg total) by mouth as needed. Patient taking differently: Take 20 mg by mouth as needed.  02/10/15   Yes Rosalio Macadamia, NP  isosorbide mononitrate (IMDUR) 30 MG 24 hr tablet Take 1 tablet (30 mg total) by mouth daily. 02/22/15  Yes Marykay Lex, MD  metoprolol tartrate (LOPRESSOR) 25 MG tablet Take 0.5 tablets (12.5 mg total) by mouth 2 (two) times daily. Patient taking differently: Take 12.5 mg by mouth daily.  01/17/15  Yes Ok Anis, NP  nitroGLYCERIN (NITROSTAT) 0.4 MG SL tablet Place 1 tablet (0.4 mg total) under the tongue every 5 (five) minutes x 3 doses as needed for chest pain. 01/17/15  Yes Ok Anis, NP  pantoprazole (PROTONIX) 40 MG tablet TAKE 1 TABLET BY MOUTH DAILY AT 12 NOON. 03/15/15  Yes Marykay Lex, MD  ranolazine (RANEXA) 500 MG 12 hr tablet Take 1 tablet (500 mg total) by mouth 2 (two) times daily. 02/09/15  Yes Rosalio Macadamia, NP   Allergies  Allergen Reactions  . 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  . Other Other (See Comments)    Sensitive to the electrodes  . Shellfish-Derived Products Other (See Comments)  Crabs, lobster, crayfish  . Vancomycin     REACTION: severe rash/hives  . Epinephrine Palpitations    Palpitations and involuntary body jerking    Social History   Social History  . Marital Status: Widowed    Spouse Name: N/A  . Number of Children: N/A  . Years of Education: N/A   Occupational History  . unemployed    Social History Main Topics  . Smoking status: Former Smoker    Types: Cigarettes  . Smokeless tobacco: None  . Alcohol Use: 8.4 oz/week    14 Glasses of wine per week     Comment: 2 glass of wine a night   . Drug Use: No  . Sexual Activity: Not Currently    Birth Control/ Protection: Post-menopausal   Other Topics Concern  . None   Social History Narrative   She is under a significant amount of stress socially.    Her husband committed suicide several years ago - she continues to have difficulty with coping.    Family History  Problem Relation Age of Onset  . Peripheral vascular disease Mother   . Hyperlipidemia Mother   . Alzheimer's disease Father   . Mental illness Sister   . Heart Problems Sister   . Schizophrenia Sister   . Hypotension Sister   . Hyperlipidemia Brother   . Heart disease Maternal Grandmother      Wt Readings from Last 3 Encounters:  09/14/15 174 lb 9.6 oz (79.198 kg)  04/20/15 175 lb 1.6 oz (79.425 kg)  03/11/15 173 lb 1 oz (78.5 kg)    PHYSICAL EXAM BP 136/78 mmHg  Pulse 54  Ht  (1.702 m)  Wt 174 lb 9.6 oz (79.198 kg)  BMI 27.34 kg/m2 General appearance: alert, cooperative, appears stated age, no distress and Relatively healthy appearing. Notably less anxious Neck: no adenopathy, no carotid bruit, no JVD and supple, symmetrical, trachea midline Lungs: CTA B, normal percussion bilaterally and Nonlabored, good air movement Heart: Bradycardic but regular rhythm, S1, S2 normal, no murmur, click, rub or gallop and normal apical impulse Abdomen: soft, non-tender; bowel sounds normal; no masses, no organomegaly Extremities: No clubbing or cyanosis. Mild trace to 1+ puffy edema in the ankles bilaterally. Pulses: 2+ and symmetric Skin: Skin color, texture, turgor normal. No rashes or lesions Neurologic: Alert and oriented X 3, normal strength and tone. Normal symmetric reflexes. Normal coordination and gait She has significant violaceous purpuric type bruising at the sites on her upper chest were her grandchild scratched her as well as on her arms where she is bumped into things..   Adult ECG Report  Rate: 54 ;  Rhythm: sinus bradycardia and Otherwise normal axis, intervals and durations.;   Narrative Interpretation: Otherwise normal EKG. Stable.   Other studies Reviewed: Additional studies/ records that were reviewed today include:  Recent Labs:   Lab Results  Component Value Date   CHOL 147 01/12/2015   HDL 58 01/12/2015   LDLCALC 75 01/12/2015    TRIG 69 01/12/2015   CHOLHDL 2.5 01/12/2015    ASSESSMENT / PLAN: Problem List Items Addressed This Visit    ST elevation myocardial infarction (STEMI) of inferior wall, subsequent episode of care (HCC) (Chronic)    She did have some evidence of subendocardial infarct on her stress test. There is no significant wall motion abnormality and reduction in ejection fraction. Partially because of her post MI angina, she was started on Ranexa. This was thought to be related to microvascular reperfusion  post MI. We can continue to monitor for further symptoms, we may be of the stopped that in the future.      Relevant Medications   metoprolol succinate (TOPROL-XL) 25 MG 24 hr tablet   Other Relevant Orders   EKG 12-Lead   Hyperlipidemia with target LDL less than 70 (Chronic)    She needs a recheck of her cholesterol. If not checked by PCP, we will recheck it better follow-up visit. I think we can probably reduce her atorvastatin 40 mg at this point. With a concern for memory issues, let her hold statin for a month and reassess. If her memory is improved, we will switch to Crestor. If no improvement, then would probably not be a statin effect. We will then restart at 40 mg.      Relevant Medications   metoprolol succinate (TOPROL-XL) 25 MG 24 hr tablet   Other Relevant Orders   EKG 12-Lead   Heart palpitations (Chronic)    This is somewhat bothersome to her home, but not long-lasting. Probably PACs or PVCs with maybe short bursts. No untoward symptoms besides just her noticing them.  Hopefully having her on a longer acting beta blocker we can keep them minimized.      Essential hypertension (Chronic)    Relatively well-controlled, but she does have room to increase blocker dose to 25 mg Toprol daily. We'll continue to monitor, but I don't think we can increase any more. She's had some dizziness, so for now I would tolerate blood pressure in this range.      Relevant Medications   metoprolol  succinate (TOPROL-XL) 25 MG 24 hr tablet   CAD S/P PCI of the ostial and mid/distal RCA - Primary (Chronic)    Very difficult PCI. Unfortunately, with ostial RCA lesion, she does have some stent extending up to the ostium. This will be very difficult to reengage. She is on low-dose Lopressor which she is taking once daily, with her having palpitations I will increase to 25 Toprol once daily (a more appropriate as once daily dosing) Currently on high-dose statin. I think we can probably reduce that based on a relook cholesterol level. On aspirin plus Effient. -- Can stop aspirin now. Will convert to Plavix in June. I think we can stop Imdur, but continue Ranexa for now. If she is doing well off of Imdur, we can try stopping Ranexa during her next visit.      Relevant Medications   metoprolol succinate (TOPROL-XL) 25 MG 24 hr tablet   Other Relevant Orders   EKG 12-Lead   Bruising    I thought she had some reaction to EKG tape, however this seems to just be the reaction. While being on aspirin plus Effient. For now we are stopping aspirin. Once there is a year out, we'll switch her to Plavix 75 mg daily. I would want her on the Thienopyridine long-term based on the amount of stent and difficulty for potential repeat instrumentation.      Relevant Orders   EKG 12-Lead      Current medicines are reviewed at length with the patient today. (+/- concerns) lots of bruising; palpitations The following changes have been made:  Stop aspirin and Imdur. Stop metoprolol tartrate.  Start metoprolol succinate 12.5 mg daily.  Stop atorvastatin 80 mg. Reassess after 1 month. If symptoms have not improved, restart at 40 mg daily. If symptoms improve, we will switch to Crestor.  Studies Ordered:   Orders Placed This Encounter  Procedures  .  EKG 12-Lead    Follow-up in June/July 2017 - with Dr. Lynda Rainwater, Vidya Bamford Lacretia Nicks, M.D., M.S. Interventional Cardiologist   Pager #  [phone] Phone # [phone] [address] [place], [place] [address]

## 2015-09-14 NOTE — Patient Instructions (Signed)
STOP ASPIRIN AND IMDUR ( ISOSORBIDE)  STOP METOPROLOL TARTRATE  START METOPROLOL SUCCINATE ( TOPROL XL)  12.5 MG ( 1/2 TABLET OF 25 MG ) DAILY  Your physician wants you to follow-up in June /July 2017 with DR HARDING - 30 MIN APPT.   You will receive a reminder letter in the mail two months in advance. If you don't receive a letter, please call our office to schedule the follow-up appointment.  If you need a refill on your cardiac medications before your next appointment, please call your pharmacy.

## 2015-09-15 ENCOUNTER — Encounter (HOSPITAL_COMMUNITY)
Admission: RE | Admit: 2015-09-15 | Discharge: 2015-09-15 | Disposition: A | Discharge status: Home / Self Care | Payer: Self-pay | Source: Ambulatory Visit | Attending: Cardiology | Admitting: Cardiology

## 2015-09-16 ENCOUNTER — Encounter: Payer: Self-pay | Admitting: Cardiology

## 2015-09-16 DIAGNOSIS — T148XXA Other injury of unspecified body region, initial encounter: Secondary | ICD-10-CM | POA: Insufficient documentation

## 2015-09-16 DIAGNOSIS — R002 Palpitations: Secondary | ICD-10-CM | POA: Insufficient documentation

## 2015-09-16 NOTE — Assessment & Plan Note (Signed)
I thought she had some reaction to EKG tape, however this seems to just be the reaction. While being on aspirin plus Effient. For now we are stopping aspirin. Once there is a year out, we'll switch her to Plavix 75 mg daily. I would want her on the Thienopyridine long-term based on the amount of stent and difficulty for potential repeat instrumentation.

## 2015-09-16 NOTE — Assessment & Plan Note (Signed)
Relatively well-controlled, but she does have room to increase blocker dose to 25 mg Toprol daily. We'll continue to monitor, but I don't think we can increase any more. She's had some dizziness, so for now I would tolerate blood pressure in this range.

## 2015-09-16 NOTE — Assessment & Plan Note (Signed)
Very difficult PCI. Unfortunately, with ostial RCA lesion, she does have some stent extending up to the ostium. This will be very difficult to reengage. She is on low-dose Lopressor which she is taking once daily, with her having palpitations I will increase to 25 Toprol once daily (a more appropriate as once daily dosing) Currently on high-dose statin. I think we can probably reduce that based on a relook cholesterol level. On aspirin plus Effient. -- Can stop aspirin now. Will convert to Plavix in June. I think we can stop Imdur, but continue Ranexa for now. If she is doing well off of Imdur, we can try stopping Ranexa during her next visit.

## 2015-09-16 NOTE — Assessment & Plan Note (Signed)
This is somewhat bothersome to her home, but not long-lasting. Probably PACs or PVCs with maybe short bursts. No untoward symptoms besides just her noticing them.  Hopefully having her on a longer acting beta blocker we can keep them minimized.

## 2015-09-16 NOTE — Assessment & Plan Note (Signed)
She did have some evidence of subendocardial infarct on her stress test. There is no significant wall motion abnormality and reduction in ejection fraction. Partially because of her post MI angina, she was started on Ranexa. This was thought to be related to microvascular reperfusion post MI. We can continue to monitor for further symptoms, we may be of the stopped that in the future.

## 2015-09-16 NOTE — Assessment & Plan Note (Signed)
She needs a recheck of her cholesterol. If not checked by PCP, we will recheck it better follow-up visit. I think we can probably reduce her atorvastatin 40 mg at this point. With a concern for memory issues, let her hold statin for a month and reassess. If her memory is improved, we will switch to Crestor. If no improvement, then would probably not be a statin effect. We will then restart at 40 mg.

## 2015-09-17 ENCOUNTER — Encounter (HOSPITAL_COMMUNITY)
Admission: RE | Admit: 2015-09-17 | Discharge: 2015-09-17 | Disposition: A | Discharge status: Home / Self Care | Payer: Self-pay | Source: Ambulatory Visit | Attending: Cardiology | Admitting: Cardiology

## 2015-09-20 ENCOUNTER — Encounter (HOSPITAL_COMMUNITY): Admission: RE | Admit: 2015-09-20 | Payer: Self-pay | Source: Ambulatory Visit

## 2015-09-22 ENCOUNTER — Encounter (HOSPITAL_COMMUNITY): Payer: Self-pay

## 2015-09-24 ENCOUNTER — Encounter (HOSPITAL_COMMUNITY): Payer: Self-pay

## 2015-09-27 ENCOUNTER — Encounter (HOSPITAL_COMMUNITY)
Admission: RE | Admit: 2015-09-27 | Discharge: 2015-09-27 | Disposition: A | Discharge status: Home / Self Care | Payer: Self-pay | Source: Ambulatory Visit | Attending: Cardiology | Admitting: Cardiology

## 2015-09-29 ENCOUNTER — Encounter (HOSPITAL_COMMUNITY)
Admission: RE | Admit: 2015-09-29 | Discharge: 2015-09-29 | Disposition: A | Discharge status: Home / Self Care | Payer: Self-pay | Source: Ambulatory Visit | Attending: Cardiology | Admitting: Cardiology

## 2015-10-01 ENCOUNTER — Encounter (HOSPITAL_COMMUNITY)
Admission: RE | Admit: 2015-10-01 | Discharge: 2015-10-01 | Disposition: A | Discharge status: Home / Self Care | Payer: Self-pay | Source: Ambulatory Visit | Attending: Cardiology | Admitting: Cardiology

## 2015-10-04 ENCOUNTER — Encounter (HOSPITAL_COMMUNITY)
Admission: RE | Admit: 2015-10-04 | Discharge: 2015-10-04 | Disposition: A | Discharge status: Home / Self Care | Payer: Self-pay | Source: Ambulatory Visit | Attending: Cardiology | Admitting: Cardiology

## 2015-10-05 ENCOUNTER — Telehealth: Payer: Self-pay | Admitting: Cardiology

## 2015-10-05 NOTE — Telephone Encounter (Signed)
Pt said when she saw Dr Herbie Baltimore a few weeks ago,he said something about getting her Cholesterol checked. Did he wants her to have it the lab at our office or her primary doctor's office?Please leave a message if she is not there.

## 2015-10-05 NOTE — Telephone Encounter (Signed)
Left msg to call - indicated we can order if she does not want to have drawn at PCP office.

## 2015-10-06 ENCOUNTER — Encounter (HOSPITAL_COMMUNITY)
Admission: RE | Admit: 2015-10-06 | Discharge: 2015-10-06 | Disposition: A | Discharge status: Home / Self Care | Payer: Self-pay | Source: Ambulatory Visit | Attending: Cardiology | Admitting: Cardiology

## 2015-10-06 DIAGNOSIS — Z9861 Coronary angioplasty status: Secondary | ICD-10-CM | POA: Insufficient documentation

## 2015-10-06 DIAGNOSIS — I2119 ST elevation (STEMI) myocardial infarction involving other coronary artery of inferior wall: Secondary | ICD-10-CM | POA: Insufficient documentation

## 2015-10-06 DIAGNOSIS — I2510 Atherosclerotic heart disease of native coronary artery without angina pectoris: Secondary | ICD-10-CM | POA: Insufficient documentation

## 2015-10-08 ENCOUNTER — Encounter (HOSPITAL_COMMUNITY)
Admission: RE | Admit: 2015-10-08 | Discharge: 2015-10-08 | Disposition: A | Discharge status: Home / Self Care | Payer: Self-pay | Source: Ambulatory Visit | Attending: Cardiology | Admitting: Cardiology

## 2015-10-11 ENCOUNTER — Encounter (HOSPITAL_COMMUNITY)
Admission: RE | Admit: 2015-10-11 | Discharge: 2015-10-11 | Disposition: A | Discharge status: Home / Self Care | Payer: Self-pay | Source: Ambulatory Visit | Attending: Cardiology | Admitting: Cardiology

## 2015-10-13 ENCOUNTER — Encounter (HOSPITAL_COMMUNITY)
Admission: RE | Admit: 2015-10-13 | Discharge: 2015-10-13 | Disposition: A | Discharge status: Home / Self Care | Payer: Self-pay | Source: Ambulatory Visit | Attending: Cardiology | Admitting: Cardiology

## 2015-10-15 ENCOUNTER — Encounter (HOSPITAL_COMMUNITY): Payer: Self-pay

## 2015-10-18 ENCOUNTER — Encounter (HOSPITAL_COMMUNITY): Payer: Self-pay

## 2015-10-20 ENCOUNTER — Encounter (HOSPITAL_COMMUNITY): Payer: Self-pay

## 2015-10-22 ENCOUNTER — Encounter (HOSPITAL_COMMUNITY)
Admission: RE | Admit: 2015-10-22 | Discharge: 2015-10-22 | Disposition: A | Discharge status: Home / Self Care | Payer: Self-pay | Source: Ambulatory Visit | Attending: Cardiology | Admitting: Cardiology

## 2015-10-25 ENCOUNTER — Encounter (HOSPITAL_COMMUNITY)
Admission: RE | Admit: 2015-10-25 | Discharge: 2015-10-25 | Disposition: A | Discharge status: Home / Self Care | Payer: Self-pay | Source: Ambulatory Visit | Attending: Cardiology | Admitting: Cardiology

## 2015-10-27 ENCOUNTER — Encounter (HOSPITAL_COMMUNITY): Payer: Self-pay

## 2015-10-27 ENCOUNTER — Other Ambulatory Visit: Payer: Self-pay

## 2015-10-27 ENCOUNTER — Telehealth: Payer: Self-pay | Admitting: Cardiology

## 2015-10-27 DIAGNOSIS — E785 Hyperlipidemia, unspecified: Secondary | ICD-10-CM

## 2015-10-27 DIAGNOSIS — Z1231 Encounter for screening mammogram for malignant neoplasm of breast: Secondary | ICD-10-CM

## 2015-10-27 NOTE — Telephone Encounter (Signed)
Laura Herrera is needing a lab order sent to have a cholesterol done

## 2015-10-27 NOTE — Telephone Encounter (Signed)
Left message for pt, lab orders are placed.  Reminded pt to come fasting for lab work.

## 2015-10-29 ENCOUNTER — Encounter (HOSPITAL_COMMUNITY): Payer: Self-pay

## 2015-11-01 ENCOUNTER — Encounter (HOSPITAL_COMMUNITY)
Admission: RE | Admit: 2015-11-01 | Discharge: 2015-11-01 | Disposition: A | Discharge status: Home / Self Care | Payer: Self-pay | Source: Ambulatory Visit | Attending: Cardiology | Admitting: Cardiology

## 2015-11-03 ENCOUNTER — Encounter (HOSPITAL_COMMUNITY): Payer: Self-pay

## 2015-11-05 ENCOUNTER — Encounter (HOSPITAL_COMMUNITY): Payer: Self-pay

## 2015-11-08 ENCOUNTER — Encounter (HOSPITAL_COMMUNITY)
Admission: RE | Admit: 2015-11-08 | Discharge: 2015-11-08 | Disposition: A | Discharge status: Home / Self Care | Payer: Self-pay | Source: Ambulatory Visit | Attending: Cardiology | Admitting: Cardiology

## 2015-11-08 DIAGNOSIS — I2510 Atherosclerotic heart disease of native coronary artery without angina pectoris: Secondary | ICD-10-CM | POA: Insufficient documentation

## 2015-11-08 DIAGNOSIS — I2119 ST elevation (STEMI) myocardial infarction involving other coronary artery of inferior wall: Secondary | ICD-10-CM | POA: Insufficient documentation

## 2015-11-08 DIAGNOSIS — Z9861 Coronary angioplasty status: Secondary | ICD-10-CM | POA: Insufficient documentation

## 2015-11-09 ENCOUNTER — Encounter (HOSPITAL_COMMUNITY): Payer: Self-pay

## 2015-11-09 ENCOUNTER — Inpatient Hospital Stay (HOSPITAL_COMMUNITY)
Admission: EM | Admit: 2015-11-09 | Discharge: 2015-11-12 | DRG: 282 | Disposition: A | Discharge status: Home / Self Care | Payer: BLUE CROSS/BLUE SHIELD | Attending: Cardiology | Admitting: Cardiology

## 2015-11-09 ENCOUNTER — Emergency Department (HOSPITAL_COMMUNITY): Payer: BLUE CROSS/BLUE SHIELD

## 2015-11-09 DIAGNOSIS — Z87891 Personal history of nicotine dependence: Secondary | ICD-10-CM

## 2015-11-09 DIAGNOSIS — I119 Hypertensive heart disease without heart failure: Secondary | ICD-10-CM | POA: Diagnosis present

## 2015-11-09 DIAGNOSIS — Z820 Family history of epilepsy and other diseases of the nervous system: Secondary | ICD-10-CM | POA: Diagnosis not present

## 2015-11-09 DIAGNOSIS — I252 Old myocardial infarction: Secondary | ICD-10-CM

## 2015-11-09 DIAGNOSIS — F329 Major depressive disorder, single episode, unspecified: Secondary | ICD-10-CM | POA: Diagnosis present

## 2015-11-09 DIAGNOSIS — Z91013 Allergy to seafood: Secondary | ICD-10-CM

## 2015-11-09 DIAGNOSIS — E669 Obesity, unspecified: Secondary | ICD-10-CM | POA: Diagnosis present

## 2015-11-09 DIAGNOSIS — I214 Non-ST elevation (NSTEMI) myocardial infarction: Secondary | ICD-10-CM | POA: Diagnosis present

## 2015-11-09 DIAGNOSIS — D649 Anemia, unspecified: Secondary | ICD-10-CM | POA: Diagnosis present

## 2015-11-09 DIAGNOSIS — E785 Hyperlipidemia, unspecified: Secondary | ICD-10-CM | POA: Diagnosis present

## 2015-11-09 DIAGNOSIS — I10 Essential (primary) hypertension: Secondary | ICD-10-CM | POA: Diagnosis present

## 2015-11-09 DIAGNOSIS — I25119 Atherosclerotic heart disease of native coronary artery with unspecified angina pectoris: Secondary | ICD-10-CM | POA: Diagnosis present

## 2015-11-09 DIAGNOSIS — K219 Gastro-esophageal reflux disease without esophagitis: Secondary | ICD-10-CM | POA: Diagnosis not present

## 2015-11-09 DIAGNOSIS — R002 Palpitations: Secondary | ICD-10-CM | POA: Diagnosis present

## 2015-11-09 DIAGNOSIS — I5181 Takotsubo syndrome: Secondary | ICD-10-CM | POA: Diagnosis not present

## 2015-11-09 DIAGNOSIS — Z955 Presence of coronary angioplasty implant and graft: Secondary | ICD-10-CM | POA: Diagnosis not present

## 2015-11-09 DIAGNOSIS — F419 Anxiety disorder, unspecified: Secondary | ICD-10-CM | POA: Diagnosis present

## 2015-11-09 DIAGNOSIS — Z8249 Family history of ischemic heart disease and other diseases of the circulatory system: Secondary | ICD-10-CM | POA: Diagnosis not present

## 2015-11-09 DIAGNOSIS — I25111 Atherosclerotic heart disease of native coronary artery with angina pectoris with documented spasm: Secondary | ICD-10-CM

## 2015-11-09 DIAGNOSIS — Z8614 Personal history of Methicillin resistant Staphylococcus aureus infection: Secondary | ICD-10-CM

## 2015-11-09 DIAGNOSIS — R011 Cardiac murmur, unspecified: Secondary | ICD-10-CM | POA: Diagnosis not present

## 2015-11-09 DIAGNOSIS — R7989 Other specified abnormal findings of blood chemistry: Secondary | ICD-10-CM

## 2015-11-09 DIAGNOSIS — I2510 Atherosclerotic heart disease of native coronary artery without angina pectoris: Secondary | ICD-10-CM

## 2015-11-09 DIAGNOSIS — Z818 Family history of other mental and behavioral disorders: Secondary | ICD-10-CM | POA: Diagnosis not present

## 2015-11-09 DIAGNOSIS — Z881 Allergy status to other antibiotic agents status: Secondary | ICD-10-CM

## 2015-11-09 DIAGNOSIS — Z6827 Body mass index (BMI) 27.0-27.9, adult: Secondary | ICD-10-CM | POA: Diagnosis not present

## 2015-11-09 DIAGNOSIS — G4700 Insomnia, unspecified: Secondary | ICD-10-CM | POA: Diagnosis present

## 2015-11-09 DIAGNOSIS — Z888 Allergy status to other drugs, medicaments and biological substances status: Secondary | ICD-10-CM | POA: Diagnosis not present

## 2015-11-09 DIAGNOSIS — I213 ST elevation (STEMI) myocardial infarction of unspecified site: Secondary | ICD-10-CM | POA: Diagnosis not present

## 2015-11-09 DIAGNOSIS — R778 Other specified abnormalities of plasma proteins: Secondary | ICD-10-CM

## 2015-11-09 DIAGNOSIS — Z9861 Coronary angioplasty status: Secondary | ICD-10-CM

## 2015-11-09 HISTORY — DX: Non-ST elevation (NSTEMI) myocardial infarction: I21.4

## 2015-11-09 HISTORY — DX: Calculus of kidney: N20.0

## 2015-11-09 HISTORY — DX: Acute myocardial infarction, unspecified: I21.9

## 2015-11-09 HISTORY — DX: Headache, unspecified: R51.9

## 2015-11-09 HISTORY — DX: Other disorders of plasma-protein metabolism, not elsewhere classified: E88.09

## 2015-11-09 HISTORY — DX: Adverse effect of unspecified anesthetic, initial encounter: T41.45XA

## 2015-11-09 HISTORY — DX: Headache: R51

## 2015-11-09 HISTORY — DX: Other complications of anesthesia, initial encounter: T88.59XA

## 2015-11-09 LAB — CBC
HEMATOCRIT: 38.3 % (ref 36.0–46.0)
Hemoglobin: 12 g/dL (ref 12.0–15.0)
MCH: 27.3 pg (ref 26.0–34.0)
MCHC: 31.3 g/dL (ref 30.0–36.0)
MCV: 87.2 fL (ref 78.0–100.0)
PLATELETS: 312 10*3/uL (ref 150–400)
RBC: 4.39 MIL/uL (ref 3.87–5.11)
RDW: 16.6 % — AB (ref 11.5–15.5)
WBC: 8.1 10*3/uL (ref 4.0–10.5)

## 2015-11-09 LAB — BASIC METABOLIC PANEL
ANION GAP: 12 (ref 5–15)
BUN: 11 mg/dL (ref 6–20)
CHLORIDE: 106 mmol/L (ref 101–111)
CO2: 21 mmol/L — ABNORMAL LOW (ref 22–32)
Calcium: 9.9 mg/dL (ref 8.9–10.3)
Creatinine, Ser: 0.98 mg/dL (ref 0.44–1.00)
GFR calc Af Amer: >60 mL/min (ref >60)
GLUCOSE: 110 mg/dL — AB (ref 65–99)
POTASSIUM: 3.9 mmol/L (ref 3.5–5.1)
Sodium: 139 mmol/L (ref 135–145)

## 2015-11-09 LAB — I-STAT TROPONIN, ED: TROPONIN I, POC: 0.12 ng/mL — AB (ref 0.00–0.08)

## 2015-11-09 LAB — PROTIME-INR
INR: 0.97 (ref 0.00–1.49)
Prothrombin Time: 13.1 seconds (ref 11.6–15.2)

## 2015-11-09 LAB — MRSA PCR SCREENING: MRSA by PCR: NEGATIVE

## 2015-11-09 LAB — TROPONIN I: TROPONIN I: 0.76 ng/mL — AB (ref <0.031)

## 2015-11-09 MED ORDER — SODIUM CHLORIDE 0.9 % WEIGHT BASED INFUSION: 1.0000 mL/kg/h | INTRAVENOUS | Status: DC

## 2015-11-09 MED ORDER — SODIUM CHLORIDE 0.9 % IV SOLN: 250.0000 mL | INTRAVENOUS | Status: DC | PRN

## 2015-11-09 MED ORDER — METOPROLOL SUCCINATE ER 25 MG PO TB24
12.5000 mg | ORAL_TABLET | Freq: Every day | ORAL | Status: DC
  Administered 2015-11-09 – 2015-11-11 (×3): 12.5 mg via ORAL
  Filled 2015-11-09 (×3): qty 1

## 2015-11-09 MED ORDER — PANTOPRAZOLE SODIUM 40 MG PO TBEC: 40.0000 mg | DELAYED_RELEASE_TABLET | Freq: Every day | ORAL | Status: DC

## 2015-11-09 MED ORDER — HEPARIN (PORCINE) IN NACL 100-0.45 UNIT/ML-% IJ SOLN
1000.0000 [IU]/h | INTRAMUSCULAR | Status: DC
  Administered 2015-11-09: 1000 [IU]/h via INTRAVENOUS
  Filled 2015-11-09: qty 250

## 2015-11-09 MED ORDER — NITROGLYCERIN IN D5W 200-5 MCG/ML-% IV SOLN
2.0000 ug/min | INTRAVENOUS | Status: DC
  Administered 2015-11-09: 5 ug/min via INTRAVENOUS
  Filled 2015-11-09: qty 250

## 2015-11-09 MED ORDER — ATORVASTATIN CALCIUM 80 MG PO TABS
80.0000 mg | ORAL_TABLET | Freq: Every day | ORAL | Status: DC
  Administered 2015-11-10 – 2015-11-11 (×2): 80 mg via ORAL
  Filled 2015-11-09 (×3): qty 1

## 2015-11-09 MED ORDER — METOPROLOL SUCCINATE ER 25 MG PO TB24: 25.0000 mg | ORAL_TABLET | Freq: Every day | ORAL | Status: DC

## 2015-11-09 MED ORDER — PANTOPRAZOLE SODIUM 40 MG PO TBEC
40.0000 mg | DELAYED_RELEASE_TABLET | Freq: Every day | ORAL | Status: DC
  Administered 2015-11-09 – 2015-11-11 (×3): 40 mg via ORAL
  Filled 2015-11-09 (×3): qty 1

## 2015-11-09 MED ORDER — SODIUM CHLORIDE 0.9% FLUSH: 3.0000 mL | INTRAVENOUS | Status: DC | PRN

## 2015-11-09 MED ORDER — ALPRAZOLAM 0.25 MG PO TABS
0.2500 mg | ORAL_TABLET | Freq: Two times a day (BID) | ORAL | Status: DC | PRN
  Administered 2015-11-10 – 2015-11-11 (×2): 0.25 mg via ORAL
  Filled 2015-11-09 (×2): qty 1

## 2015-11-09 MED ORDER — SODIUM CHLORIDE 0.9% FLUSH: 3.0000 mL | Freq: Two times a day (BID) | INTRAVENOUS | Status: DC

## 2015-11-09 MED ORDER — PRASUGREL HCL 10 MG PO TABS
10.0000 mg | ORAL_TABLET | Freq: Every day | ORAL | Status: DC
  Administered 2015-11-09 – 2015-11-11 (×3): 10 mg via ORAL
  Filled 2015-11-09 (×4): qty 1

## 2015-11-09 MED ORDER — ACETAMINOPHEN 325 MG PO TABS
650.0000 mg | ORAL_TABLET | ORAL | Status: DC | PRN
  Administered 2015-11-10: 650 mg via ORAL
  Filled 2015-11-09: qty 2

## 2015-11-09 MED ORDER — VITAMIN D 1000 UNITS PO TABS: 1000.0000 [IU] | ORAL_TABLET | Freq: Every day | ORAL | Status: DC

## 2015-11-09 MED ORDER — FUROSEMIDE 20 MG PO TABS
20.0000 mg | ORAL_TABLET | Freq: Every day | ORAL | Status: DC
  Administered 2015-11-11: 10:00:00 20 mg via ORAL
  Filled 2015-11-09 (×2): qty 1

## 2015-11-09 MED ORDER — NITROGLYCERIN IN D5W 200-5 MCG/ML-% IV SOLN: 10.0000 ug/min | INTRAVENOUS | Status: DC

## 2015-11-09 MED ORDER — RANOLAZINE ER 500 MG PO TB12
500.0000 mg | ORAL_TABLET | Freq: Two times a day (BID) | ORAL | Status: DC
  Administered 2015-11-09: 500 mg via ORAL
  Filled 2015-11-09 (×2): qty 1

## 2015-11-09 MED ORDER — ONDANSETRON HCL 4 MG/2ML IJ SOLN: 4.0000 mg | Freq: Four times a day (QID) | INTRAMUSCULAR | Status: DC | PRN

## 2015-11-09 MED ORDER — HEART ATTACK BOUNCING BOOK
Freq: Once | Status: AC
  Administered 2015-11-09: 21:00:00
  Filled 2015-11-09: qty 1

## 2015-11-09 MED ORDER — SODIUM CHLORIDE 0.9 % WEIGHT BASED INFUSION: 3.0000 mL/kg/h | INTRAVENOUS | Status: DC

## 2015-11-09 MED ORDER — ASPIRIN EC 81 MG PO TBEC
81.0000 mg | DELAYED_RELEASE_TABLET | Freq: Every day | ORAL | Status: DC
  Administered 2015-11-10 – 2015-11-11 (×2): 81 mg via ORAL
  Filled 2015-11-09 (×3): qty 1

## 2015-11-09 MED ORDER — ASPIRIN 81 MG PO CHEW
324.0000 mg | CHEWABLE_TABLET | Freq: Once | ORAL | Status: AC
  Administered 2015-11-09: 324 mg via ORAL
  Filled 2015-11-09: qty 4

## 2015-11-09 MED ORDER — PRASUGREL HCL 10 MG PO TABS: 10.0000 mg | ORAL_TABLET | Freq: Every day | ORAL | Status: DC

## 2015-11-09 MED ORDER — NITROGLYCERIN 0.4 MG SL SUBL
0.4000 mg | SUBLINGUAL_TABLET | SUBLINGUAL | Status: DC | PRN
  Filled 2015-11-09: qty 1

## 2015-11-09 MED ORDER — HEPARIN BOLUS VIA INFUSION
4000.0000 [IU] | Freq: Once | INTRAVENOUS | Status: AC
  Administered 2015-11-09: 4000 [IU] via INTRAVENOUS
  Filled 2015-11-09: qty 4000

## 2015-11-09 NOTE — ED Provider Notes (Signed)
CSN: [id number]     Arrival date & time 11/09/15  1625 History   First MD Initiated Contact with Patient 11/09/15 1721     Chief Complaint  Patient presents with  . Chest Pain     (Consider location/radiation/quality/duration/timing/severity/associated sxs/prior Treatment) HPI Patient developed chest pain about 45 minutes prior to arrival in the emergency department. She reports that she was driving to her Pilates class. She developed left-sided pressure with numbness into her left arm. She denies associated symptoms. Patient reports that she had a heart attack in June. She was at that time the pain was different, it was in her left shoulder blade. She states that she took her morning medications approximately 3 hours late today but she reports she never misses medications and is very upset about having taken them late this morniing. A gets extensive bruising due to her medications. She denies any history of GI bleeding. He reports that she was well before the symptoms started. She has not had fever, chills or cough. He reports she gets a small amount of swelling in her ankles and that she thinks she has a little additional swelling because she had some salty food yesterday evening. Past Medical History  Diagnosis Date  . Psoriasis   . MRSA infection     h/o MRSA infection in the right foot  . Anxiety   . Depression   . Insomnia   . PONV (postoperative nausea and vomiting)   . Coronary artery disease     a. 01/2015 Inf STEMI/PCI: RCA 80ost (2.75x12 Promus Premier DES), 100p (2.5x24 Promus Premier DES), 70p/m (2.5x24 Promus Premier DES), 90d (PTCA);  b. 01/2015 Relook Cath: LM nl, LAD 43m, D1 min irregs, LCX nl- L->R collats,  OM1 mod dzs, OM2 mild dizs, RCA 10ost, heavy thrombus p/m, 10d->Aggrastat;  c. 01/2015 Echo: EF 60-65%, no rwma, mod AI, PASP .  Marland Kitchen Hypertension   . Dyslipidemia    Past Surgical History  Procedure Laterality Date  . Cardiac catheterization    . Cardiac  catheterization N/A 01/11/2015    Procedure: Left Heart Cath and Coronary Angiography;  Surgeon: Marykay Lex, MD;  [facility] INVAS[facility] CV LAB;  Service: Cardiovascular;  Laterality: N/A;  . Cardiac catheterization  01/11/2015    Procedure: Coronary Stent Intervention;  Surgeon: Marykay Lex, MD;  Location: [facility] INVASIVE CV LAB;  Service: Cardiovascular;;  . Cardiac catheterization  01/11/2015    Procedure: Coronary Balloon Angioplasty;  Surgeon: Marykay Lex, MD;  Location: [facility] INVASIVE CV LAB;  Service: Cardiovascular;;  . Cardiac catheterization N/A 01/12/2015    Procedure: Left Heart Cath and Coronary Angiography;  Surgeon: Lyn Records, MD;  Location: [facility] INVASIVE CV LAB;  Service: Cardiovascular;  Laterality: N/A;  . Angioplasty  01/12/2015    Procedure: Angioplasty;  Surgeon: Lyn Records, MD;  Location: [facility] INVASIVE CV LAB;  Service: Cardiovascular;;   Family History  Problem Relation Age of Onset  . Peripheral vascular disease Mother   . Hyperlipidemia Mother   . Alzheimer's disease Father   . Mental illness Sister   . Heart Problems Sister   . Schizophrenia Sister   . Hypotension Sister   . Hyperlipidemia Brother   . Heart disease Maternal Grandmother    Social History  Substance Use Topics  . Smoking status: Former Smoker    Types: Cigarettes  . Smokeless tobacco: None  . Alcohol Use: 8.4 oz/week    14 Glasses of wine per week  Comment: 2 glass of wine a night    OB History    No data available     Review of Systems 10 Systems reviewed and are negative for acute change except as noted in the HPI.    Allergies  Cholinesterase inhibitors; Other; Shellfish-derived products; Vancomycin; and Epinephrine  Home Medications   Prior to Admission medications   Medication Sig Start Date End Date Taking? Authorizing Provider  atorvastatin (LIPITOR) 80 MG tablet TAKE 1 TABLET BY MOUTH EVERY DAY 07/23/15  Yes Marykay Lex, MD  EFFIENT 10 MG TABS tablet Take 10 mg by  mouth daily. 05/22/15  Yes Historical Provider, MD  furosemide (LASIX) 20 MG tablet Take 0.5 tablets (10 mg total) by mouth as needed. Patient taking differently: Take 20 mg by mouth as needed.  02/10/15  Yes Rosalio Macadamia, NP  metoprolol succinate (TOPROL-XL) 25 MG 24 hr tablet Take 0.5 tablets (12.5 mg total) by mouth daily. 09/14/15  Yes Marykay Lex, MD  nitroGLYCERIN (NITROSTAT) 0.4 MG SL tablet Place 1 tablet (0.4 mg total) under the tongue every 5 (five) minutes x 3 doses as needed for chest pain. 01/17/15  Yes Ok Anis, NP  pantoprazole (PROTONIX) 40 MG tablet TAKE 1 TABLET BY MOUTH DAILY AT 12 NOON. 03/15/15  Yes Marykay Lex, MD  ranolazine (RANEXA) 500 MG 12 hr tablet Take 1 tablet (500 mg total) by mouth 2 (two) times daily. 02/09/15  Yes Rosalio Macadamia, NP  acetaminophen (TYLENOL) 500 MG tablet Take 500 mg by mouth every 6 (six) hours as needed for headache.    Historical Provider, MD  cholecalciferol (VITAMIN D) 1000 UNITS tablet Take 1,000 Units by mouth daily.    Historical Provider, MD  clobetasol cream (TEMOVATE) 0.05 % Apply 1 application topically 2 (two) times daily.    Historical Provider, MD   BP 163/72 mmHg  Pulse 67  Temp(Src) 97.8 F (36.6 C) (Oral)  Resp 11  SpO2 99% Physical Exam  Constitutional: She is oriented to person, place, and time. She appears well-developed and well-nourished.  Patient is alert. No respiratory distress. Color is good.  HENT:  Head: Normocephalic and atraumatic.  Mouth/Throat: Oropharynx is clear and moist.  Eyes: EOM are normal. Pupils are equal, round, and reactive to light.  Neck: Neck supple.  Cardiovascular: Normal rate, regular rhythm, normal heart sounds and intact distal pulses.   Pulmonary/Chest: Effort normal and breath sounds normal.  Abdominal: Soft. Bowel sounds are normal. She exhibits no distension. There is no tenderness.  Musculoskeletal: Normal range of motion. She exhibits edema.  1+ edema bilateral ankles  and lower legs.  Neurological: She is alert and oriented to person, place, and time. She has normal strength. Coordination normal. GCS eye subscore is 4. GCS verbal subscore is 5. GCS motor subscore is 6.  Skin: Skin is warm, dry and intact.  Patient has extensive bruising. There are ecchymoses on her chest and on her extremities.  Psychiatric: She has a normal mood and affect.    ED Course  Procedures (including critical care time) CRITICAL CARE Performed by: Arby Barrette   Total critical care time: 30 minutes  Critical care time was exclusive of separately billable procedures and treating other patients.  Critical care was necessary to treat or prevent imminent or life-threatening deterioration.  Critical care was time spent personally by me on the following activities: development of treatment plan with patient and/or surrogate as well as nursing, discussions with consultants, evaluation of  patient's response to treatment, examination of patient, obtaining history from patient or surrogate, ordering and performing treatments and interventions, ordering and review of laboratory studies, ordering and review of radiographic studies, pulse oximetry and re-evaluation of patient's condition. Labs Review Labs Reviewed  BASIC METABOLIC PANEL - Abnormal; Notable for the following:    CO2 21 (*)    Glucose, Bld 110 (*)    All other components within normal limits  CBC - Abnormal; Notable for the following:    RDW 16.6 (*)    All other components within normal limits  I-STAT TROPOININ, ED - Abnormal; Notable for the following:    Troponin i, poc 0.12 (*)    All other components within normal limits  PROTIME-INR  TROPONIN I  TROPONIN I  TROPONIN I  HEMOGLOBIN A1C  POC OCCULT BLOOD, ED    Imaging Review Dg Chest 2 View  11/09/2015  CLINICAL DATA:  Onset of shortness of breath, LEFT arm pain and numbness with pain radiating to chest while driving today, history coronary artery  disease post MI and coronary stenting, hypertension, dyslipidemia, former smoker EXAM: CHEST  2 VIEW COMPARISON:  01/20/2015 FINDINGS: Normal heart size and pulmonary vascularity. Atherosclerotic calcification aorta. Small hiatal hernia again seen. Minimal peribronchial thickening without infiltrate, pleural effusion or pneumothorax. Bones demineralized. IMPRESSION: Small hiatal hernia. Minimal bronchitic changes without infiltrate. Electronically Signed   By: Ulyses Southward M.D.   On: 11/09/2015 17:27   I have personally reviewed and evaluated these images and lab results as part of my medical decision-making.   EKG Interpretation   Date/Time:  Tuesday November 09 2015 16:31:00 EDT Ventricular Rate:  61 PR Interval:  156 QRS Duration: 78 QT Interval:  434 QTC Calculation: 436 R Axis:   -11 Text Interpretation:  Normal sinus rhythm Normal ECG anterior elevation,  ishemic appearance Confirmed by Donnald Garre, MD, Lebron Conners [id number]) on 11/09/2015  5:23:13 PM     Consult:(17:36) cardiology will be to ED ASAP to evaluate patient. MDM   Final diagnoses:  Non-STEMI (non-ST elevated myocardial infarction) [facility])   Patient with approximate 45 minutes of chest pain and left arm numbness prior to arrival. Troponin elevated upon first draw. Patient is alert with clear mental status and no respiratory distress. She does not have hypotension. Rhythm is sinus. Patient is started on heparin drip with aspirin administered and nitroglycerin drip. Patient remains with clear mental status and stable vital signs.    Arby Barrette, MD 11/09/15 [phone]

## 2015-11-09 NOTE — Progress Notes (Signed)
ANTICOAGULATION CONSULT NOTE - Initial Consult  Pharmacy Consult for Heparin Indication: chest pain/ACS  Allergies  Allergen Reactions  . 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  . Other Other (See Comments)    Sensitive to the electrodes  . Shellfish-Derived Products Other (See Comments)    Crabs, lobster, crayfish  . Vancomycin     REACTION: severe rash/hives  . Epinephrine Palpitations    Palpitations and involuntary body jerking    Patient Measurements: TBW: 79.2 kg; Ht: 5'7''; IBW 61.6 kg Heparin Dosing Weight: 77.7 kg  Vital Signs: Temp: 97.8 F (36.6 C) (04/04 1635) Temp Source: Oral (04/04 1635) BP: 165/60 mmHg (04/04 1737) Pulse Rate: 63 (04/04 1737)  Labs:  Recent Labs  11/09/15 1638  HGB 12.0  HCT 38.3  PLT 312  LABPROT 13.1  INR 0.97  CREATININE 0.98    CrCl cannot be calculated (Unknown ideal weight.).   Medical History: Past Medical History  Diagnosis Date  . Psoriasis   . MRSA infection     h/o MRSA infection in the right foot  . Anxiety   . Depression   . Insomnia   . PONV (postoperative nausea and vomiting)   . Coronary artery disease     a. 01/2015 Inf STEMI/PCI: RCA 80ost (2.75x12 Promus Premier DES), 100p (2.5x24 Promus Premier DES), 70p/m (2.5x24 Promus Premier DES), 90d (PTCA);  b. 01/2015 Relook Cath: LM nl, LAD 9m, D1 min irregs, LCX nl- L->R collats,  OM1 mod dzs, OM2 mild dizs, RCA 10ost, heavy thrombus p/m, 10d->Aggrastat;  c. 01/2015 Echo: EF 60-65%, no rwma, mod AI, PASP .  Marland Kitchen Hypertension   . Dyslipidemia      Assessment: 39-yo female presenting with chest pain that radiates down left arm.  PMH includes CAD, s/p STEMI/PCI in June 2016, hypertension, dyslipidemia, depression/anxiety.  Pharmacy consulted to start heparin in the setting of ACS.    CBC wnl, no oral anticoagulation prior to admission, but on Prasugrel   Goal of Therapy:   Heparin level 0.3-0.7 units/ml Monitor platelets by anticoagulation protocol: Yes   Plan:  --Start Heparin 4000 units x 1 --Followed by Heparin 1000 units/hr --Obtain 6h HL and follow CBC and HL daily --Follow up plans for Cardiac Cath  Kathlynn Grate 11/09/2015,5:40 PM

## 2015-11-09 NOTE — ED Notes (Signed)
After triage was completed, pt became tearful at triage and revealed she did not want to wait in the waiting room next to many people so this RN allowed patient. to sit in the triage waiting room for privacy.

## 2015-11-09 NOTE — H&P (Signed)
KeMaMMarland KitchenLoSheppLadona RiAllyson Sabalel [name] [phone] K77it hennne 540[place]os EkeTS13Bethann BerkshirAdvanc d Micro DevKandee Keences([phone])Laura [phone] Kitchennne Drilling<[phone] Laura Ri540[place]9arcos Ekely13Bethann BerkshirAdvanc d Micro [phone] KeencesEN50T> b7Kandee KeenCiLoaM540KenLoaMarland Kitchennne DrillingEkeenSheppard [phone] Ridge76l BerkAll540[place]9arcos Ekedv13Bethann BerkshirAdvanc d Micro DevKandee [phone] KeenLo<BADTEXTTALoaMarland Kitchennne DrillingKentuSheppard [phone] Herrera 69Ek nAllyso540[place]9arcos Ekeet13Bethann BerkshirAdvanc d Micro DevKandee Kee21Kandee Herrera [phone] 5614cesLoaMarland Kitchennne DrillingBADTESheppard [phone] Ridgelle 25DM Allyson540[place]9arcos Ekei<13Bethann BerkshirAdvanc d Micro DevKandee Ke66Kandee Herrera-[phone] Kitchennne [phone] Laura R36i gell DMa540[place]os Ekeal13Bethann BerkshirAdvanc d Micro DevKandee Keenc(281[name]573-7826sordLoaMarland Kitchennne DrillingADTEXSheppard [phone] Ridgellll58iM Allyson540[place]9arcos Ekeg<13Bethann BerkshirAdvanc d Micro DevKandKandee Keene [phone] Kitchennne Drillingri<BASheppard [phone] Ridgel69lL anMAlly540[place]9arcos Eke E13Bethann BerkshirAdvanc d Micro [phone] Keen85c<[phone]>sCarson 7S Loa5409ne540[place]9arcos Eke M13Bethann BerkshirAdvanc d Micro DevKandee Keencesng00  mg total) by mouth 2 (two) times daily.  acetaminophen (TYLENOL) 500 MG tablet Take 500 mg by mouth every 6 (six) hours as needed for headache.  cholecalciferol (VITAMIN D) 1000 UNITS tablet Take 1,000 Units by mouth daily.  clobetasol cream (TEMOVATE) 0.05 % Apply 1 application topically 2 (two) times daily.   Scheduled Meds:  Continuous Infusions: . heparin 1,000 Units/hr (11/09/15 1806)  . nitroGLYCERIN 5 mcg/min (11/09/15 1805)   PRN Meds:.  Allergies:  Allergies  Allergen Reactions  . Cholinesterase Inhibitors Other (See Comments)    Enzyme deficiency which  causes muscle paralyses after receiving anesthesia.  . Other Other (See Comments)    Sensitive to the electrodes  . Shellfish-Derived Products Other (See Comments)    Crabs, lobster, crayfish  . Vancomycin     REACTION: severe rash/hives  . Epinephrine Palpitations    Palpitations and involuntary body jerking    Social History   Social History  . Marital Status: Widowed    Spouse Name: N/A  . Number of Children: N/A  . Years of Education: N/A   Occupational History  . unemployed    Social History Main Topics  . Smoking status: Former Smoker    Types: Cigarettes  . Smokeless tobacco: Not on file  . Alcohol Use: 8.4 oz/week    14 Glasses of wine per week     Comment: 2 glass of wine a night   . Drug Use: No  . Sexual Activity: Not Currently    Birth Control/ Protection: Post-menopausal   Other Topics Concern  . Not on file   Social History Narrative   She is under a significant amount of stress socially.    Her husband committed suicide several years ago - she continues to have difficulty with coping.    Family History  Problem Relation Age of Onset  . Peripheral vascular disease Mother   . Hyperlipidemia Mother   . Alzheimer's disease Father   . Mental illness Sister   . Heart Problems Sister   . Schizophrenia Sister   . Hypotension Sister   . Hyperlipidemia Brother   . Heart disease Maternal Grandmother    Family Status  Relation Status Death Age  . Mother Deceased   . Father Deceased   . Sister Alive   . Brother Alive   . Maternal Grandmother Deceased   . Maternal Grandfather Deceased   . Paternal Grandmother Deceased   . Paternal Grandfather Deceased     Review of Systems:   Full 14-point review of systems otherwise negative except as noted above.  Physical Exam: Blood pressure 163/72, pulse 67, temperature 97.8 F (36.6 C), temperature source Oral, resp. rate 11, SpO2 99 %. General: Well developed, well nourished,female in no acute  distress. Head: Normocephalic, atraumatic, sclera non-icteric, no xanthomas, nares are without discharge. Dentition: good Neck: No carotid bruits. JVD not elevated. No thyromegally Lungs: Good expansion bilaterally. without wheezes or rhonchi.  Heart: Regular rate and rhythm with S1 S2.  No S3 or S4.  No murmur, no rubs, or gallops appreciated. Abdomen: Soft, non-tender, non-distended with normoactive bowel sounds. No hepatomegaly. No rebound/guarding. No obvious abdominal masses. Msk:  Strength and tone appear normal for age. No joint deformities or effusions, no spine or costo-vertebral angle tenderness. Extremities: No clubbing or cyanosis. No edema.  Distal pedal pulses are 2+ in 4 extrem Neuro: Alert and oriented X 3. Moves all extremities spontaneously. No focal deficits noted. Psych:  Responds to questions appropriately with a normal  affect. Skin: No rashes or lesions noted  Labs:   Lab Results  Component Value Date   WBC 8.1 11/09/2015   HGB 12.0 11/09/2015   HCT 38.3 11/09/2015   MCV 87.2 11/09/2015   PLT 312 11/09/2015    Recent Labs  11/09/15 1638  INR 0.97     Recent Labs Lab 11/09/15 1638  NA 139  K 3.9  CL 106  CO2 21*  BUN 11  CREATININE 0.98  CALCIUM 9.9  GLUCOSE 110*    Recent Labs  11/09/15 1648  TROPIPOC 0.12*    Radiology/Studies: Dg Chest 2 View 11/09/2015  CLINICAL DATA:  Onset of shortness of breath, LEFT arm pain and numbness with pain radiating to chest while driving today, history coronary artery disease post MI and coronary stenting, hypertension, dyslipidemia, former smoker EXAM: CHEST  2 VIEW COMPARISON:  01/20/2015 FINDINGS: Normal heart size and pulmonary vascularity. Atherosclerotic calcification aorta. Small hiatal hernia again seen. Minimal peribronchial thickening without infiltrate, pleural effusion or pneumothorax. Bones demineralized. IMPRESSION: Small hiatal hernia. Minimal bronchitic changes without infiltrate. Electronically  Signed   By: Ulyses Southward M.D.   On: 11/09/2015 17:27   ECG: 11/09/2015 SR, No ST elevation  ASSESSMENT AND PLAN:  Active Problems:   NSTEMI (non-ST elevated myocardial infarction) (HCC) - admit, rx symptoms with IV NTG, heparin, ASA  - plan cath in am - ck lipids in am  Laura Herrera 11/09/2015 6:34 PM Beeper [phone] Patient seen and examined and history reviewed. Agree with above findings and plan. 65 yo WF with known CAD. S/p inferior MI in June 2016. Complicated case due to severe tortuosity of the RCA and difficulty engaging RCA with catheter. The ostium and proximal vessel were stented with DES. Seen in February and noted a lot of bruising. ASA stopped and continued on Effient alone. Today while driving to Pilates class she developed left precordial chest pain 4-5/10. She drove herself to ED. Did not take Ntg. Pain is different from MI pain which was more scapular. She does note some tingling and numbness in left arm. On exam she is very anxious and tearful.  BP is elevated. Pulse 64. No JVD or bruits. Lungs are clear. CV without gallop or murmur Mild ankle edema. Pulses 2+  i stat troponin mildly elevated 0.12. Ecg without acute ST changes. CXR is clear. Other labs are Ok  Impression:  NSTEMI Prior complex stenting of very tortuous RCA HLD  Plan: admit stepdown IV heparin and Ntg Increase Toprol to 25 mg daily Continue statin Resume ASA and continue Effient. Plan LHC +/- PCI tomorrow. The procedure and risks were reviewed including but not limited to death, myocardial infarction, stroke, arrythmias, bleeding, transfusion, emergency surgery, dye allergy, or renal dysfunction. The patient voices understanding and is agreeable to proceed. Given difficulty with prior radial approach would recommend femoral approach this time.   Laura Herrera, MDFACC 11/09/2015 7:22 PM

## 2015-11-09 NOTE — ED Notes (Signed)
Hubert Azure, RN came to triage to bring patient back to a room but patient was taken to xray. This RN called xray to ask them to take pt to assigned room A4 when they are finished with her x-ray.

## 2015-11-09 NOTE — ED Notes (Signed)
Pt reports left sided CP that radiates down her left arm and reports her arm feels numb. Symptoms began today around 1615. Hx of MI in June of 2016. Denies SOB/N/V/D. She states the pain does not feel similar to her MI.

## 2015-11-09 NOTE — ED Notes (Signed)
MP notified of critical value

## 2015-11-10 ENCOUNTER — Encounter (HOSPITAL_COMMUNITY)
Admission: EM | Disposition: A | Discharge status: Home / Self Care | Payer: Self-pay | Source: Home / Self Care | Attending: Cardiology

## 2015-11-10 ENCOUNTER — Telehealth (HOSPITAL_COMMUNITY): Payer: Self-pay | Admitting: *Deleted

## 2015-11-10 ENCOUNTER — Encounter (HOSPITAL_COMMUNITY): Admission: RE | Admit: 2015-11-10 | Payer: Self-pay | Source: Ambulatory Visit

## 2015-11-10 DIAGNOSIS — I2510 Atherosclerotic heart disease of native coronary artery without angina pectoris: Secondary | ICD-10-CM

## 2015-11-10 DIAGNOSIS — I10 Essential (primary) hypertension: Secondary | ICD-10-CM

## 2015-11-10 HISTORY — PX: CARDIAC CATHETERIZATION: SHX172

## 2015-11-10 LAB — BASIC METABOLIC PANEL
Anion gap: 11 (ref 5–15)
BUN: 11 mg/dL (ref 6–20)
CHLORIDE: 110 mmol/L (ref 101–111)
CO2: 20 mmol/L — ABNORMAL LOW (ref 22–32)
CREATININE: 1.07 mg/dL — AB (ref 0.44–1.00)
Calcium: 9.4 mg/dL (ref 8.9–10.3)
GFR calc Af Amer: >60 mL/min (ref >60)
GFR calc non Af Amer: 54 mL/min — ABNORMAL LOW (ref >60)
GLUCOSE: 100 mg/dL — AB (ref 65–99)
POTASSIUM: 4 mmol/L (ref 3.5–5.1)
Sodium: 141 mmol/L (ref 135–145)

## 2015-11-10 LAB — CBC
HCT: 33.6 % — ABNORMAL LOW (ref 36.0–46.0)
Hemoglobin: 10.5 g/dL — ABNORMAL LOW (ref 12.0–15.0)
MCH: 27.1 pg (ref 26.0–34.0)
MCHC: 31.3 g/dL (ref 30.0–36.0)
MCV: 86.6 fL (ref 78.0–100.0)
PLATELETS: 277 10*3/uL (ref 150–400)
RBC: 3.88 MIL/uL (ref 3.87–5.11)
RDW: 16.8 % — ABNORMAL HIGH (ref 11.5–15.5)
WBC: 8.3 10*3/uL (ref 4.0–10.5)

## 2015-11-10 LAB — HEMOGLOBIN A1C
HEMOGLOBIN A1C: 5.9 % — AB (ref 4.8–5.6)
Mean Plasma Glucose: 123 mg/dL

## 2015-11-10 LAB — LIPID PANEL
CHOL/HDL RATIO: 2 ratio
Cholesterol: 149 mg/dL (ref 0–200)
HDL: 73 mg/dL (ref >40)
LDL CALC: 56 mg/dL (ref 0–99)
TRIGLYCERIDES: 98 mg/dL (ref <150)
VLDL: 20 mg/dL (ref 0–40)

## 2015-11-10 LAB — POCT ACTIVATED CLOTTING TIME
ACTIVATED CLOTTING TIME: 240 s
ACTIVATED CLOTTING TIME: 188 s
ACTIVATED CLOTTING TIME: 173 s
Activated Clotting Time: 100 seconds

## 2015-11-10 LAB — TROPONIN I
TROPONIN I: 0.52 ng/mL — AB (ref <0.031)
Troponin I: 0.66 ng/mL (ref <0.031)

## 2015-11-10 LAB — HEPARIN LEVEL (UNFRACTIONATED): HEPARIN UNFRACTIONATED: 1.09 [IU]/mL — AB (ref 0.30–0.70)

## 2015-11-10 SURGERY — INTRAVASCULAR PRESSURE WIRE/FFR STUDY

## 2015-11-10 MED ORDER — MIDAZOLAM HCL 2 MG/2ML IJ SOLN
INTRAMUSCULAR | Status: AC
  Filled 2015-11-10: qty 2

## 2015-11-10 MED ORDER — LIDOCAINE HCL (PF) 1 % IJ SOLN
INTRAMUSCULAR | Status: DC | PRN
  Administered 2015-11-10: 20 mL

## 2015-11-10 MED ORDER — HEPARIN (PORCINE) IN NACL 100-0.45 UNIT/ML-% IJ SOLN: 800.0000 [IU]/h | INTRAMUSCULAR | Status: DC

## 2015-11-10 MED ORDER — FENTANYL CITRATE (PF) 100 MCG/2ML IJ SOLN
INTRAMUSCULAR | Status: AC
  Filled 2015-11-10: qty 2

## 2015-11-10 MED ORDER — ONDANSETRON HCL 4 MG/2ML IJ SOLN: 4.0000 mg | Freq: Four times a day (QID) | INTRAMUSCULAR | Status: DC | PRN

## 2015-11-10 MED ORDER — HEPARIN (PORCINE) IN NACL 2-0.9 UNIT/ML-% IJ SOLN
INTRAMUSCULAR | Status: AC
  Filled 2015-11-10: qty 1000

## 2015-11-10 MED ORDER — NITROGLYCERIN 1 MG/10 ML FOR IR/CATH LAB
INTRA_ARTERIAL | Status: DC | PRN
  Administered 2015-11-10: 200 ug via INTRACORONARY

## 2015-11-10 MED ORDER — SODIUM CHLORIDE 0.9 % IV SOLN: 250.0000 mL | INTRAVENOUS | Status: DC | PRN

## 2015-11-10 MED ORDER — ADENOSINE (DIAGNOSTIC) 140MCG/KG/MIN
INTRAVENOUS | Status: DC | PRN
  Administered 2015-11-10: 140 ug/kg/min via INTRAVENOUS

## 2015-11-10 MED ORDER — LIDOCAINE HCL (PF) 1 % IJ SOLN
INTRAMUSCULAR | Status: AC
Start: 2015-11-10 — End: 2015-11-10
  Filled 2015-11-10: qty 30

## 2015-11-10 MED ORDER — HEPARIN SODIUM (PORCINE) 5000 UNIT/ML IJ SOLN
5000.0000 [IU] | Freq: Three times a day (TID) | INTRAMUSCULAR | Status: DC
  Administered 2015-11-11 (×3): 5000 [IU] via SUBCUTANEOUS
  Filled 2015-11-10 (×4): qty 1

## 2015-11-10 MED ORDER — SODIUM CHLORIDE 0.9% FLUSH
3.0000 mL | Freq: Two times a day (BID) | INTRAVENOUS | Status: DC
  Administered 2015-11-11: 3 mL via INTRAVENOUS

## 2015-11-10 MED ORDER — SODIUM CHLORIDE 0.9 % IV SOLN
INTRAVENOUS | Status: DC
  Administered 2015-11-10: 16:00:00 via INTRAVENOUS

## 2015-11-10 MED ORDER — MIDAZOLAM HCL 2 MG/2ML IJ SOLN
INTRAMUSCULAR | Status: DC | PRN
  Administered 2015-11-10: 2 mg via INTRAVENOUS
  Administered 2015-11-10 (×2): 1 mg via INTRAVENOUS

## 2015-11-10 MED ORDER — ACETAMINOPHEN 325 MG PO TABS: 650.0000 mg | ORAL_TABLET | ORAL | Status: DC | PRN

## 2015-11-10 MED ORDER — HEPARIN (PORCINE) IN NACL 2-0.9 UNIT/ML-% IJ SOLN
INTRAMUSCULAR | Status: DC | PRN
  Administered 2015-11-10: 12:00:00

## 2015-11-10 MED ORDER — RANOLAZINE ER 500 MG PO TB12
500.0000 mg | ORAL_TABLET | Freq: Two times a day (BID) | ORAL | Status: DC
  Administered 2015-11-10 – 2015-11-11 (×3): 500 mg via ORAL
  Filled 2015-11-10 (×3): qty 1

## 2015-11-10 MED ORDER — FENTANYL CITRATE (PF) 100 MCG/2ML IJ SOLN
INTRAMUSCULAR | Status: DC | PRN
  Administered 2015-11-10: 25 ug via INTRAVENOUS
  Administered 2015-11-10: 50 ug via INTRAVENOUS
  Administered 2015-11-10: 25 ug via INTRAVENOUS

## 2015-11-10 MED ORDER — HEPARIN (PORCINE) IN NACL 2-0.9 UNIT/ML-% IJ SOLN
INTRAMUSCULAR | Status: DC | PRN
  Administered 2015-11-10: 500 mL

## 2015-11-10 MED ORDER — ISOSORBIDE MONONITRATE ER 30 MG PO TB24
30.0000 mg | ORAL_TABLET | Freq: Every day | ORAL | Status: DC
  Administered 2015-11-10 – 2015-11-11 (×2): 30 mg via ORAL
  Filled 2015-11-10 (×3): qty 1

## 2015-11-10 MED ORDER — ADENOSINE 12 MG/4ML IV SOLN
16.0000 mL | Freq: Once | INTRAVENOUS | Status: DC
  Filled 2015-11-10 (×2): qty 16

## 2015-11-10 MED ORDER — NITROGLYCERIN 1 MG/10 ML FOR IR/CATH LAB
INTRA_ARTERIAL | Status: AC
  Filled 2015-11-10: qty 10

## 2015-11-10 MED ORDER — SODIUM CHLORIDE 0.9% FLUSH: 3.0000 mL | INTRAVENOUS | Status: DC | PRN

## 2015-11-10 MED ORDER — HEPARIN SODIUM (PORCINE) 1000 UNIT/ML IJ SOLN
INTRAMUSCULAR | Status: DC | PRN
  Administered 2015-11-10: 5000 [IU] via INTRAVENOUS

## 2015-11-10 SURGICAL SUPPLY — 15 items
CATH INFINITI 5 FR AR1 MOD (CATHETERS) ×2 IMPLANT
CATH INFINITI 5FR MULTPACK ANG (CATHETERS) ×2 IMPLANT
CATH SITESEER 5F NTR (CATHETERS) ×2 IMPLANT
CATH VISTA GUIDE 6FR XBLAD3.5 (CATHETERS) ×2 IMPLANT
GUIDE CATH RUNWAY 6FR FL3.5 (CATHETERS) ×2 IMPLANT
GUIDEWIRE PRESSURE COMET II (WIRE) ×2 IMPLANT
KIT ESSENTIALS PG (KITS) ×2 IMPLANT
KIT HEART LEFT (KITS) ×2 IMPLANT
PACK CARDIAC CATHETERIZATION (CUSTOM PROCEDURE TRAY) ×2 IMPLANT
SHEATH PINNACLE 5F 10CM (SHEATH) ×2 IMPLANT
SHEATH PINNACLE 6F 10CM (SHEATH) ×2 IMPLANT
SYR MEDRAD MARK V 150ML (SYRINGE) ×2 IMPLANT
TRANSDUCER W/STOPCOCK (MISCELLANEOUS) ×2 IMPLANT
TUBING CIL FLEX 10 FLL-RA (TUBING) ×2 IMPLANT
WIRE EMERALD 3MM-J .035X150CM (WIRE) ×2 IMPLANT

## 2015-11-10 NOTE — Progress Notes (Signed)
 Patient Name: Laura Herrera Date of Encounter: 11/10/2015     Active Problems:   NSTEMI (non-ST elevated myocardial infarction) (HCC)    SUBJECTIVE  No more chest pain. Awaiting heart cath today.   CURRENT MEDS . aspirin EC  81 mg Oral Daily  . atorvastatin  80 mg Oral Daily  . cholecalciferol  1,000 Units Oral Daily  . furosemide  20 mg Oral Daily  . metoprolol succinate  12.5 mg Oral QHS  . pantoprazole  40 mg Oral QHS  . prasugrel  10 mg Oral QHS  . ranolazine  500 mg Oral BID  . sodium chloride flush  3 mL Intravenous Q12H  . sodium chloride flush  3 mL Intravenous Q12H    OBJECTIVE  Filed Vitals:   11/10/15 0000 11/10/15 0200 11/10/15 0402 11/10/15 0600  BP: 134/50 122/46 116/37 108/39  Pulse:    57  Temp:    97.9 F (36.6 C)  TempSrc:    Oral  Resp: 15 12 15 17  Height:      Weight:    174 lb 6.1 oz (79.1 kg)  SpO2: 100% 100% 100% 95%    Intake/Output Summary (Last 24 hours) at 11/10/15 0617 Last data filed at 11/10/15 0500  Gross per 24 hour  Intake 322.53 ml  Output    400 ml  Net -77.47 ml   Filed Weights   11/09/15 2023 11/10/15 0600  Weight: 174 lb 6.1 oz (79.1 kg) 174 lb 6.1 oz (79.1 kg)    PHYSICAL EXAM  General: Pleasant, NAD. obese Neuro: Alert and oriented X 3. Moves all extremities spontaneously. Psych: Normal affect. HEENT:  Normal  Neck: Supple without bruits or JVD. Lungs:  Resp regular and unlabored, CTA. Heart: RRR no s3, s4, or murmurs. Abdomen: Soft, non-tender, non-distended, BS + x 4.  Extremities: No clubbing, cyanosis or edema. DP/PT/Radials 2+ and equal bilaterally.  Accessory Clinical Findings  CBC  Recent Labs  11/09/15 1638  WBC 8.1  HGB 12.0  HCT 38.3  MCV 87.2  PLT 312   Basic Metabolic Panel  Recent Labs  11/09/15 1638  NA 139  K 3.9  CL 106  CO2 21*  GLUCOSE 110*  BUN 11  CREATININE 0.98  CALCIUM 9.9   Cardiac Enzymes  Recent Labs  11/09/15 1951 11/10/15 0004  TROPONINI 0.76*  0.66*    TELE  NSR with some sinus brady overnight  Radiology/Studies  Dg Chest 2 View  11/09/2015  CLINICAL DATA:  Onset of shortness of breath, LEFT arm pain and numbness with pain radiating to chest while driving today, history coronary artery disease post MI and coronary stenting, hypertension, dyslipidemia, former smoker EXAM: CHEST  2 VIEW COMPARISON:  01/20/2015 FINDINGS: Normal heart size and pulmonary vascularity. Atherosclerotic calcification aorta. Small hiatal hernia again seen. Minimal peribronchial thickening without infiltrate, pleural effusion or pneumothorax. Bones demineralized. IMPRESSION: Small hiatal hernia. Minimal bronchitic changes without infiltrate. Electronically Signed   By: Mark  Boles M.D.   On: 11/09/2015 17:27    ASSESSMENT AND PLAN  Aikam N Engen is a 65 y.o. female with a history of CAD s/p STEMI 01/2015 w/ difficult PCI/DES to RCA and mod dz OM, HLD, HTN, palpitations and depression/anxiety who presented to [facility] ED on 11/09/15 with chest pain and ruled in for NSTEMI.  NSTEMI: troponin .015--> 0.76--> 0.66. ECG with no acute ST or TW changes.  -- Continue IV heparin and NTG -- Plan LHC +/- PCI today via femoral approach   due to difficulty with prior radial approach  -- Previously was not taking ASA due to easy bruising. ASA added back to [facility] on admission. Continue statin and BB as well as Ranexa.   HTN: BP well controlled  HLD: LDL at goal: 56. Continue statin   Palpitations: well controlled on long acting BB. NSR while here  Signed, Janetta Hora PA-C  Pager [phone]  No pain for cath today.  RUE with skin abrasion from BP cuff.  Dressing intact Will be done from RFA as cors hard to cannulate from radial.  HTN and chol well Controlled on current meds  Charlton Haws

## 2015-11-10 NOTE — Interval H&P Note (Signed)
Cath Lab Visit (complete for each Cath Lab visit)  Clinical Evaluation Leading to the Procedure:   ACS: Yes.    Non-ACS:    Anginal Classification: CCS IV  Anti-ischemic medical therapy: Maximal Therapy (2 or more classes of medications)  Non-Invasive Test Results: No non-invasive testing performed  Prior CABG: No previous CABG      History and Physical Interval Note:  11/10/2015 11:34 AM  Laura Herrera  has presented today for surgery, with the diagnosis of Chest Pain   The various methods of treatment have been discussed with the patient and family. After consideration of risks, benefits and other options for treatment, the patient has consented to  Procedure(s): Left Heart Cath and Coronary Angiography (N/A) as a surgical intervention .  The patient's history has been reviewed, patient examined, no change in status, stable for surgery.  I have reviewed the patient's chart and labs.  Questions were answered to the patient's satisfaction.     Laura Herrera A

## 2015-11-10 NOTE — Progress Notes (Signed)
ANTICOAGULATION CONSULT NOTE  Pharmacy Consult for Heparin Indication: chest pain/ACS  Allergies  Allergen Reactions  . 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  . Other Other (See Comments)    Sensitive to the electrodes  . Shellfish-Derived Products Other (See Comments)    Crabs, lobster, crayfish  . Vancomycin     REACTION: severe rash/hives  . Epinephrine Palpitations    Palpitations and involuntary body jerking    Patient Measurements: TBW: 79.2 kg; Ht: 5'7''; IBW 61.6 kg Heparin Dosing Weight: 77.7 kg  Vital Signs: Temp: 98.8 F (37.1 C) (04/04 2023) Temp Source: Oral (04/04 2023) BP: 141/77 mmHg (04/04 2200) Pulse Rate: 66 (04/04 2023)  Labs:  Recent Labs  11/09/15 1638 11/09/15 1951 11/10/15 0004 11/10/15 0009  HGB 12.0  --   --   --   HCT 38.3  --   --   --   PLT 312  --   --   --   LABPROT 13.1  --   --   --   INR 0.97  --   --   --   HEPARINUNFRC  --   --   --  1.09*  CREATININE 0.98  --   --   --   TROPONINI  --  0.76* 0.66*  --     Estimated Creatinine Clearance: 63.6 mL/min (by C-G formula based on Cr of 0.98).  Assessment: 65 y.o. female with chest pain for heparin   Goal of Therapy:  Heparin level 0.3-0.7 units/ml Monitor platelets by anticoagulation protocol: Yes   Plan:  Hold heparin x 45 min, then decrease Heparin 800 units/hr F/U after cath  Eddie Candle 11/10/2015,12:43 AM

## 2015-11-10 NOTE — H&P (View-Only) (Signed)
Patient Name: Laura Herrera Date of Encounter: 11/10/2015     Active Problems:   NSTEMI (non-ST elevated myocardial infarction) (HCC)    SUBJECTIVE  No more chest pain. Awaiting heart cath today.   CURRENT MEDS . aspirin EC  81 mg Oral Daily  . atorvastatin  80 mg Oral Daily  . cholecalciferol  1,000 Units Oral Daily  . furosemide  20 mg Oral Daily  . metoprolol succinate  12.5 mg Oral QHS  . pantoprazole  40 mg Oral QHS  . prasugrel  10 mg Oral QHS  . ranolazine  500 mg Oral BID  . sodium chloride flush  3 mL Intravenous Q12H  . sodium chloride flush  3 mL Intravenous Q12H    OBJECTIVE  Filed Vitals:   11/10/15 0000 11/10/15 0200 11/10/15 0402 11/10/15 0600  BP: 134/50 122/46 116/37 108/39  Pulse:    57  Temp:    97.9 F (36.6 C)  TempSrc:    Oral  Resp: Height:      Weight:    174 lb 6.1 oz (79.1 kg)  SpO2: 100% 100% 100% 95%    Intake/Output Summary (Last 24 hours) at 11/10/15 0617 Last data filed at 11/10/15 0500  Gross per 24 hour  Intake 322.53 ml  Output    400 ml  Net -77.47 ml   Filed Weights   11/09/15 2023 11/10/15 0600  Weight: 174 lb 6.1 oz (79.1 kg) 174 lb 6.1 oz (79.1 kg)    PHYSICAL EXAM  General: Pleasant, NAD. obese Neuro: Alert and oriented X 3. Moves all extremities spontaneously. Psych: Normal affect. HEENT:  Normal  Neck: Supple without bruits or JVD. Lungs:  Resp regular and unlabored, CTA. Heart: RRR no s3, s4, or murmurs. Abdomen: Soft, non-tender, non-distended, BS + x 4.  Extremities: No clubbing, cyanosis or edema. DP/PT/Radials 2+ and equal bilaterally.  Accessory Clinical Findings  CBC  Recent Labs  11/09/15 1638  WBC 8.1  HGB 12.0  HCT 38.3  MCV 87.2  PLT 312   Basic Metabolic Panel  Recent Labs  11/09/15 1638  NA 139  K 3.9  CL 106  CO2 21*  GLUCOSE 110*  BUN 11  CREATININE 0.98  CALCIUM 9.9   Cardiac Enzymes  Recent Labs  11/09/15 1951 11/10/15 0004  TROPONINI 0.76*  0.66*    TELE  NSR with some sinus brady overnight  Radiology/Studies  Dg Chest 2 View  11/09/2015  CLINICAL DATA:  Onset of shortness of breath, LEFT arm pain and numbness with pain radiating to chest while driving today, history coronary artery disease post MI and coronary stenting, hypertension, dyslipidemia, former smoker EXAM: CHEST  2 VIEW COMPARISON:  01/20/2015 FINDINGS: Normal heart size and pulmonary vascularity. Atherosclerotic calcification aorta. Small hiatal hernia again seen. Minimal peribronchial thickening without infiltrate, pleural effusion or pneumothorax. Bones demineralized. IMPRESSION: Small hiatal hernia. Minimal bronchitic changes without infiltrate. Electronically Signed   By: Ulyses Southward M.D.   On: 11/09/2015 17:27    ASSESSMENT AND PLAN  CHERYN LUNDQUIST is a 65 y.o. female with a history of CAD s/p STEMI 01/2015 w/ difficult PCI/DES to RCA and mod dz OM, HLD, HTN, palpitations and depression/anxiety who presented to [facility] ED on 11/09/15 with chest pain and ruled in for NSTEMI.  NSTEMI: troponin .015--> 0.76--> 0.66. ECG with no acute ST or TW changes.  -- Continue IV heparin and NTG -- Plan LHC +/- PCI today via femoral approach  due to difficulty with prior radial approach  -- Previously was not taking ASA due to easy bruising. ASA added back to [facility] on admission. Continue statin and BB as well as Ranexa.   HTN: BP well controlled  HLD: LDL at goal: 56. Continue statin   Palpitations: well controlled on long acting BB. NSR while here  Signed, Janetta Hora PA-C  Pager [phone]  No pain for cath today.  RUE with skin abrasion from BP cuff.  Dressing intact Will be done from RFA as cors hard to cannulate from radial.  HTN and chol well Controlled on current meds  Charlton Haws

## 2015-11-11 ENCOUNTER — Ambulatory Visit: Payer: BLUE CROSS/BLUE SHIELD

## 2015-11-11 ENCOUNTER — Inpatient Hospital Stay (HOSPITAL_COMMUNITY): Payer: BLUE CROSS/BLUE SHIELD

## 2015-11-11 ENCOUNTER — Encounter (HOSPITAL_COMMUNITY): Payer: Self-pay | Admitting: Cardiovascular Disease

## 2015-11-11 DIAGNOSIS — I213 ST elevation (STEMI) myocardial infarction of unspecified site: Secondary | ICD-10-CM

## 2015-11-11 DIAGNOSIS — R011 Cardiac murmur, unspecified: Secondary | ICD-10-CM

## 2015-11-11 DIAGNOSIS — I5181 Takotsubo syndrome: Secondary | ICD-10-CM

## 2015-11-11 HISTORY — PX: TRANSTHORACIC ECHOCARDIOGRAM: SHX275

## 2015-11-11 LAB — CBC
HCT: 30.1 % — ABNORMAL LOW (ref 36.0–46.0)
Hemoglobin: 9.4 g/dL — ABNORMAL LOW (ref 12.0–15.0)
MCH: 26.9 pg (ref 26.0–34.0)
MCHC: 31.2 g/dL (ref 30.0–36.0)
MCV: 86 fL (ref 78.0–100.0)
Platelets: 240 10*3/uL (ref 150–400)
RBC: 3.5 MIL/uL — ABNORMAL LOW (ref 3.87–5.11)
RDW: 16.9 % — AB (ref 11.5–15.5)
WBC: 5.9 10*3/uL (ref 4.0–10.5)

## 2015-11-11 LAB — ECHOCARDIOGRAM COMPLETE
HEIGHTINCHES: 67 in
WEIGHTICAEL: 2790.14 [oz_av]

## 2015-11-11 MED ORDER — LISINOPRIL 5 MG PO TABS
5.0000 mg | ORAL_TABLET | Freq: Every day | ORAL | Status: DC
  Administered 2015-11-11: 10:00:00 5 mg via ORAL
  Filled 2015-11-11 (×2): qty 1

## 2015-11-11 MED ORDER — PERFLUTREN LIPID MICROSPHERE
INTRAVENOUS | Status: AC
  Administered 2015-11-11: 2 mL
  Filled 2015-11-11: qty 10

## 2015-11-11 MED ORDER — ZOLPIDEM TARTRATE 5 MG PO TABS: 5.0000 mg | ORAL_TABLET | Freq: Every evening | ORAL | Status: DC | PRN

## 2015-11-11 NOTE — Progress Notes (Signed)
CARDIAC REHAB PHASE I   PRE:  Rate/Rhythm: 67 SR  BP:  Sitting: 140/60        SaO2: 100 RA  MODE:  Ambulation: 1000 ft   POST:  Rate/Rhythm: 86 SR  BP:  Sitting: 157/67         SaO2: 100 RA  Pt ambulated 1000 ft on RA, independent, steady gait, tolerated well, no complaints. Completed MI education. Pt was seen last June with MI/PCI. Reviewed risk factors, MI book, Takotsubo's cardiomyopathy, anti-platelet therapy, activity restrictions, ntg, exercise, heart healthy diet, sodium restrictions, CHF booklet and zone, tool, daily weights and phase 2 cardiac rehab. Pt verbalized understanding. Pt agrees to phase 2 cardiac rehab referral, currently in cardiac rehab maintenance program, will send to [facility] per pt request. Pt to recliner after walk, call bell within reach. Will follow.   [phone]  Joylene Grapes, RN, BSN 11/11/2015 9:18 AM

## 2015-11-11 NOTE — Progress Notes (Signed)
Patient Name: Laura Herrera Date of Encounter: 11/11/2015  Active Problems:   Essential hypertension   Hyperlipidemia with target LDL less than 70   NSTEMI (non-ST elevated myocardial infarction) (HCC)   Non-STEMI (non-ST elevated myocardial infarction) [facility])   Primary Cardiologist: Dr. Herbie [place] Patient Profile: 65 y.o. Female hx of CAD s/p STEMI 01/2015 with difficult PCI/DES to RCA and mod dz OM/HLD/HTN/depression and palpitations ruled in for NSTEMI and LHC completed 11/10/15.   SUBJECTIVE: No chest pain or SOB  OBJECTIVE Filed Vitals:   11/10/15 1802 11/10/15 1900 11/10/15 2000 11/11/15 0500  BP: 125/44 117/39 105/46 147/46  Pulse:   73   Temp:   98.1 F (36.7 C) 98 F (36.7 C)  TempSrc:   Oral Oral  Resp: Height:      Weight:      SpO2:  100% 100% 99%    Intake/Output Summary (Last 24 hours) at 11/11/15 0703 Last data filed at 11/11/15 0600  Gross per 24 hour  Intake 1715.83 ml  Output    900 ml  Net 815.83 ml   Filed Weights   11/09/15 2023 11/10/15 0600  Weight: 174 lb 6.1 oz (79.1 kg) 174 lb 6.1 oz (79.1 kg)    PHYSICAL EXAM General: Well developed, well nourished, female in no acute distress. Head: Normocephalic, atraumatic.  Neck: Supple, no JVD. Lungs:  Resp regular and unlabored, CTA. Heart: RRR, S1, S2, no S3, S4, 1/6 arotic/systolic murmur RUSB, radiating to bilateral carotids; no rub. Abdomen: Soft, non-tender, non-distended, BS + x 4. Bruising noted to right femoral region. Mildly tender with palpation. No bruit noted. Extremities: No clubbing, cyanosis, edema.  Neuro: Alert and oriented X 3. Moves all extremities spontaneously. Psych: Normal affect.  LABS: CBC: Recent Labs  11/10/15 0520 11/11/15 0346  WBC 8.3 5.9  HGB 10.5* 9.4*  HCT 33.6* 30.1*  MCV 86.6 86.0  PLT 277 240   INR: Recent Labs  11/09/15 1638  INR 0.97   Basic Metabolic Panel: Recent Labs  11/09/15 1638 11/10/15 0520  NA 139 141  K 3.9 4.0    CL 106 110  CO2 21* 20*  GLUCOSE 110* 100*  BUN 11 11  CREATININE 0.98 1.07*  CALCIUM 9.9 9.4   Liver Function Tests:No results for input(s): AST, ALT, ALKPHOS, BILITOT, PROT, ALBUMIN in the last 72 hours. Cardiac Enzymes: Recent Labs  11/09/15 1951 11/10/15 0004 11/10/15 0629  TROPONINI 0.76* 0.66* 0.52*    Recent Labs  11/09/15 1648  TROPIPOC 0.12*   BNP:  B NATRIURETIC PEPTIDE  Date/Time Value Ref Range Status  01/20/2015 11:58 PM 173.4* 0.0 - 100.0 pg/mL Final  01/11/2015 10:58 PM 289.6* 0.0 - 100.0 pg/mL Final   PRO B NATRIURETIC PEPTIDE (BNP)  Date/Time Value Ref Range Status  02/05/2015 03:25 PM 121.0* 0.0 - 100.0 pg/mL Final   D-dimer:No results for input(s): DDIMER in the last 72 hours. Hemoglobin A1C: Recent Labs  11/09/15 1951  HGBA1C 5.9*   Fasting Lipid Panel: Recent Labs  11/10/15 0520  CHOL 149  HDL 73  LDLCALC 56  TRIG 98  CHOLHDL 2.0   TELE: SR Rate-70s  ECG: 11/10/15 Deep Twave inversion in anterior leads.  Radiology/Studies: Dg Chest 2 View  11/09/2015  CLINICAL DATA:  Onset of shortness of breath, LEFT arm pain and numbness with pain radiating to chest while driving today, history coronary artery disease post MI and coronary stenting, hypertension, dyslipidemia, former smoker EXAM: CHEST  2 VIEW COMPARISON:  01/20/2015 FINDINGS: Normal heart size and pulmonary vascularity. Atherosclerotic calcification aorta. Small hiatal hernia again seen. Minimal peribronchial thickening without infiltrate, pleural effusion or pneumothorax. Bones demineralized. IMPRESSION: Small hiatal hernia. Minimal bronchitic changes without infiltrate. Electronically Signed   By: Ulyses Southward M.D.   On: 11/09/2015 17:27     Current Medications:  . aspirin EC  81 mg Oral Daily  . atorvastatin  80 mg Oral Daily  . furosemide  20 mg Oral Daily  . heparin  5,000 Units Subcutaneous 3 times per day  . isosorbide mononitrate  30 mg Oral Daily  . metoprolol succinate   12.5 mg Oral QHS  . pantoprazole  40 mg Oral QHS  . prasugrel  10 mg Oral QHS  . ranolazine  500 mg Oral BID  . sodium chloride flush  3 mL Intravenous Q12H   . sodium chloride Stopped (11/11/15 0130)  . nitroGLYCERIN Stopped (11/10/15 1045)    ASSESSMENT AND PLAN: Active Problems:   NSTEMI (non-ST elevated myocardial infarction) (HCC) -LHC 11/10/15- Ost LAD lesion, 40% stenosed, Ost LM lesion, 30% stenosed,Prox Cx lesion, 20% stenosed, Mid LAD to Dist LAD lesion, 10% stenosed. RCA stent patent. -Current medication therapy ASA, Statin, BB, Effient -Echo 2016- LV EF: 60% - 65%, NWMA -Repeat Echo ordered for current EF -Will discuss need for carotic [place] -With sudden drop in EF without sign lesion, consider Takotsubo cardiomyopathy    Essential hypertension -Managed well on current therapy    Hyperlipidemia with target LDL less than 70 -Lipitor  Signed, Laverda Page, NP-C 7:03 AM 11/11/2015   The patient was seen, examined and discussed with Laverda Page, NP-C and I agree with the above.   65 year old female with known CAD s/p STEMI 01/2015 with difficult PCI/DES to RCA and mod dz OM/HLD/HTN/depression and palpitations ruled in for NSTEMI and LHC completed 11/10/15, she underwent cardiac cath yesterday that showed only mild to moderate nonobstructive disease and patent stent in her RCA. Last year patient's echocardiogram showed normal LVEF 60-65% with no regional wall motion abnormalities, cardiac It looked at her apical segments were akinetic suspicious for stress induced cardiomyopathy. Patient denies any chest pain today. Her blood pressures elevated I will add lisinopril 5 mg daily to her regimen. Ideally we should maximize beta blokade however she is bradycardic at baseline. Currently on metoprolol 12.5 mg by mouth twice a day. We will repeat echocardiogram today to evaluate her wall motion abnormalities and LVEF. Anticipated discharge tomorrow. For now continue aspirin,  atorvastatin, and metoprolol.  Lars Masson 11/11/2015

## 2015-11-11 NOTE — Progress Notes (Signed)
Echocardiogram 2D Echocardiogram with Definity has been performed.  Laura Herrera 11/11/2015, 4:00 PM

## 2015-11-11 NOTE — Care Management Note (Signed)
Case Management Note  Patient Details  Name: Laura Herrera MRN: [id number] Date of Birth: Aug 16, 1950  Subjective/Objective:     Patient is from home alone, s/p heart cath, NCM will cont to follow for dc needs.               Action/Plan:   Expected Discharge Date:                  Expected Discharge Plan:  Home/Self Care  In-House Referral:     Discharge planning Services  CM Consult  Post Acute Care Choice:    Choice offered to:     DME Arranged:    DME Agency:     HH Arranged:    HH Agency:     Status of Service:  Completed, signed off  Medicare Important Message Given:    Date Medicare IM Given:    Medicare IM give by:    Date Additional Medicare IM Given:    Additional Medicare Important Message give by:     If discussed at Long Length of Stay Meetings, dates discussed:    Additional Comments:  Leone Haven, RN 11/11/2015, 11:48 AM

## 2015-11-12 ENCOUNTER — Encounter (HOSPITAL_COMMUNITY)
Admission: RE | Admit: 2015-11-12 | Discharge: 2015-11-12 | Disposition: A | Discharge status: Home / Self Care | Payer: Self-pay | Source: Ambulatory Visit | Attending: Cardiology | Admitting: Cardiology

## 2015-11-12 DIAGNOSIS — Z9861 Coronary angioplasty status: Secondary | ICD-10-CM

## 2015-11-12 DIAGNOSIS — R778 Other specified abnormalities of plasma proteins: Secondary | ICD-10-CM

## 2015-11-12 DIAGNOSIS — I25111 Atherosclerotic heart disease of native coronary artery with angina pectoris with documented spasm: Secondary | ICD-10-CM

## 2015-11-12 DIAGNOSIS — R7989 Other specified abnormal findings of blood chemistry: Secondary | ICD-10-CM

## 2015-11-12 LAB — CBC
HCT: 30.5 % — ABNORMAL LOW (ref 36.0–46.0)
Hemoglobin: 9.6 g/dL — ABNORMAL LOW (ref 12.0–15.0)
MCH: 27.1 pg (ref 26.0–34.0)
MCHC: 31.5 g/dL (ref 30.0–36.0)
MCV: 86.2 fL (ref 78.0–100.0)
PLATELETS: 241 10*3/uL (ref 150–400)
RBC: 3.54 MIL/uL — AB (ref 3.87–5.11)
RDW: 17 % — AB (ref 11.5–15.5)
WBC: 6.8 10*3/uL (ref 4.0–10.5)

## 2015-11-12 MED ORDER — LISINOPRIL 5 MG PO TABS: 2.5000 mg | ORAL_TABLET | Freq: Every day | ORAL | Status: DC

## 2015-11-12 MED ORDER — BACITRACIN-NEOMYCIN-POLYMYXIN 400-5-5000 EX OINT: 1.0000 "application " | TOPICAL_OINTMENT | Freq: Two times a day (BID) | CUTANEOUS | Status: DC

## 2015-11-12 MED ORDER — ISOSORBIDE MONONITRATE ER 30 MG PO TB24: 30.0000 mg | ORAL_TABLET | Freq: Every day | ORAL | Status: DC

## 2015-11-12 MED ORDER — ASPIRIN 81 MG PO TBEC: 81.0000 mg | DELAYED_RELEASE_TABLET | Freq: Every day | ORAL | Status: DC

## 2015-11-12 NOTE — Discharge Instructions (Addendum)
No driving for 24 hours. No lifting over 5 lbs for 1 week. No sexual activity for 1 week. You may return to work on 11/15/2015. Keep procedure site clean & dry. If you notice increased pain, swelling, bleeding or pus, call/return!  You may shower, but no soaking baths/hot tubs/pools for 1 week.   RIGHT ARM SKIN TEAR:  APPLY NEOMYCIN OINTMENT TO SITE TWICE A DAY AND COVER WITH GAUZE.  CALL MD FOR ANY PAIN, SWELLING, PUS, BLEEDING, OR WARMTH.

## 2015-11-12 NOTE — Progress Notes (Signed)
Patient Name: Laura Herrera Date of Encounter: 11/12/2015  Primary Cardiologist: Dr. Herbie Baltimore  Patient Profile: 65 y.o. Female hx of CAD s/p STEMI 01/2015 with difficult PCI/DES to RCA and mod dz OM/HLD/HTN/depression and palpitations ruled in for NSTEMI and LHC completed 11/10/15.    Principal Problem:   Elevated troponin Active Problems:   Essential hypertension   CAD S/P PCI of the ostial and mid/distal RCA   Hyperlipidemia with target LDL less than 70   Takotsubo cardiomyopathy    SUBJECTIVE  Denies any CP or SOB. R groin still tender.   CURRENT MEDS . aspirin EC  81 mg Oral Daily  . atorvastatin  80 mg Oral Daily  . furosemide  20 mg Oral Daily  . heparin  5,000 Units Subcutaneous 3 times per day  . isosorbide mononitrate  30 mg Oral Daily  . lisinopril  5 mg Oral Daily  . metoprolol succinate  12.5 mg Oral QHS  . pantoprazole  40 mg Oral QHS  . prasugrel  10 mg Oral QHS  . ranolazine  500 mg Oral BID  . sodium chloride flush  3 mL Intravenous Q12H    OBJECTIVE  Filed Vitals:   11/11/15 1642 11/11/15 1913 11/11/15 2000 11/12/15 0356  BP:  106/40  92/31  Pulse:  82  55  Temp: 98 F (36.7 C) 97.5 F (36.4 C)  97.7 F (36.5 C)  TempSrc: Oral Oral  Oral  Resp:  17 24 20   Height:      Weight:    177 lb 14.6 oz (80.7 kg)  SpO2:  94%  93%    Intake/Output Summary (Last 24 hours) at 11/12/15 0800 Last data filed at 11/11/15 1913  Gross per 24 hour  Intake    480 ml  Output   2950 ml  Net  -2470 ml   Filed Weights   11/09/15 2023 11/10/15 0600 11/12/15 0356  Weight: 174 lb 6.1 oz (79.1 kg) 174 lb 6.1 oz (79.1 kg) 177 lb 14.6 oz (80.7 kg)    PHYSICAL EXAM  General: Pleasant, NAD. Neuro: Alert and oriented X 3. Moves all extremities spontaneously. Psych: Normal affect. HEENT:  Normal  Neck: Supple without bruits or JVD. Lungs:  Resp regular and unlabored, CTA. Heart: RRR no s3, s4, or murmurs. Abdomen: Soft, non-tender, non-distended, BS + x 4.  R  femoral cath site bruised. Mildly tender to touch. Extremities: No clubbing, cyanosis or edema. DP/PT/Radials 2+ and equal bilaterally.  Accessory Clinical Findings  CBC  Recent Labs  11/11/15 0346 11/12/15 0400  WBC 5.9 6.8  HGB 9.4* 9.6*  HCT 30.1* 30.5*  MCV 86.0 86.2  PLT 240 241   Basic Metabolic Panel  Recent Labs  11/09/15 1638 11/10/15 0520  NA 139 141  K 3.9 4.0  CL 106 110  CO2 21* 20*  GLUCOSE 110* 100*  BUN 11 11  CREATININE 0.98 1.07*  CALCIUM 9.9 9.4   Cardiac Enzymes  Recent Labs  11/09/15 1951 11/10/15 0004 11/10/15 0629  TROPONINI 0.76* 0.66* 0.52*   Hemoglobin A1C  Recent Labs  11/09/15 1951  HGBA1C 5.9*   Fasting Lipid Panel  Recent Labs  11/10/15 0520  CHOL 149  HDL 73  LDLCALC 56  TRIG 98  CHOLHDL 2.0    TELE NSR with HR 50s    ECG  No new EKG  Echocardiogram 11/11/2015  LV EF: 65% - 70%  ------------------------------------------------------------------- Indications: MI - acute 410.91. Murmur 785.2.  ------------------------------------------------------------------- History: PMH:  Coronary artery disease. PMH: Myocardial infarction. Risk factors: Former tobacco use. Dyslipidemia.  ------------------------------------------------------------------- Study Conclusions  - Left ventricle: The cavity size was normal. Wall thickness was  normal. Systolic function was vigorous. The estimated ejection  fraction was in the range of 65% to 70%. Doppler parameters are  consistent with abnormal left ventricular relaxation (grade 1  diastolic dysfunction). - Aortic valve: There was mild to moderate regurgitation. - Mitral valve: Calcified annulus. Mildly thickened leaflets .    Radiology/Studies  Dg Chest 2 View  11/09/2015  CLINICAL DATA:  Onset of shortness of breath, LEFT arm pain and numbness with pain radiating to chest while driving today, history coronary artery disease post MI and  coronary stenting, hypertension, dyslipidemia, former smoker EXAM: CHEST  2 VIEW COMPARISON:  01/20/2015 FINDINGS: Normal heart size and pulmonary vascularity. Atherosclerotic calcification aorta. Small hiatal hernia again seen. Minimal peribronchial thickening without infiltrate, pleural effusion or pneumothorax. Bones demineralized. IMPRESSION: Small hiatal hernia. Minimal bronchitic changes without infiltrate. Electronically Signed   By: Ulyses Southward M.D.   On: 11/09/2015 17:27    ASSESSMENT AND PLAN  1. Chest pain with elevated trop   - LHC 11/10/15- Ost LAD lesion, 40% stenosed, Ost LM lesion, 30% stenosed,Prox Cx lesion, 20% stenosed, Mid LAD to Dist LAD lesion, 10% stenosed. RCA stent patent.  - discharge today, instructed the patient to ambulate, continue observe R groin over the next week, if pain worsen, contact cardiology. No bruit or pulsatile mass on exam to suggest pseudoaneurysm. Patient did not complain of back pain to think RP bleed.  2. Possible Takotsubo's cardiomyopathy with reduced EF  - EF 30% on cath, previous echo 2016- LV EF: 60% - 65%, NWMA  - patient placed on ACEI, Echo repeated on following day 11/11/2015 EF 65-70%, grade 1 DD, mild to moderate AR  - continue Toprol XL, lisinopril added yesterday. SBP borderline in 90s around 4AM, recheck BP this morning.   3. Anemia: hgb stable. R femoral cath site very bruised, but no bruit or pulsatile mass  4. HTN  5. HLD: on high dose lipitor  Signed, Amedeo Plenty Pager: [phone]  The patient was seen, examined and discussed with Azalee Course, PA-C and I agree with the above.   65 year old female with known CAD s/p STEMI 01/2015 with difficult PCI/DES to RCA and mod dz OM/HLD/HTN/depression and palpitations ruled in for NSTEMI and LHC completed 11/10/15, she underwent cardiac cath yesterday that showed only mild to moderate nonobstructive disease and patent stent in her RCA. Last year patient's echocardiogram showed normal LVEF  60-65% with no regional wall motion abnormalities, cardiac It looked at her apical segments were akinetic suspicious for stress induced cardiomyopathy. Patient denies any chest pain today. Her blood pressures was elevated, we added lisinopril 5 mg daily to her regimen, her BP is rather low now, I would decrease lisinopril dose to 2.5 mg po daily. LVEF normal on yesterdays echo with no regional wall motion abnormalities.  Continue metoprolol 12.5 mg by mouth twice a day, aspirin, atorvastatin.  Discharge today, follow up in the clinic within the next two weeks.   Lars Masson 11/12/2015

## 2015-11-12 NOTE — Discharge Summary (Signed)
Discharge Summary    Patient ID: Laura Herrera,  MRN: [id number], DOB/AGE: [birth year] 64 y.o.  Admit date: 11/09/2015 Discharge date: 11/12/2015  Primary Care Provider: Darrow Bussing Primary Cardiologist: Dr. Herbie Baltimore  Discharge Diagnoses    Principal Problem:   Elevated troponin Active Problems:   Essential hypertension   CAD S/P PCI of the ostial and mid/distal RCA   Hyperlipidemia with target LDL less than 70   Takotsubo cardiomyopathy   Allergies Allergies  Allergen Reactions  . 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  . Other Other (See Comments)    Sensitive to the electrodes  . Shellfish-Derived Products Other (See Comments)    Crabs, lobster, crayfish  . Vancomycin     REACTION: severe rash/hives  . Epinephrine Palpitations    Palpitations and involuntary body jerking    Diagnostic Studies/Procedures    Cath 11/10/2015 Conclusion     Ost LAD lesion, 40% stenosed.  Ost LM lesion, 30% stenosed.  Prox Cx lesion, 20% stenosed.  Mid LAD to Dist LAD lesion, 10% stenosed.  There is moderate to severe left ventricular systolic dysfunction.  Moderately severe LV dysfunction with severe hypo-to akinesis involving the entire anterior anterolateral wall extending to the apex with an ejection fraction of 30%.  Smooth ostial narrowing of the left main 30%; eccentric ostial 40% LAD stenosis seen on only selected views. Smooth mild 10% mid LAD narrowing; normal small ramus immediate vessel; tortuous left circumflex: Artery with mild 20% proximal narrowing; and widely patent ostial and mid RCA stents.  Normal fractional flow reserve involving flow via the left main and ostial LAD.    Echo 11/11/2015  LV EF: 65% - 70%  ------------------------------------------------------------------- Indications: MI - acute 410.91. Murmur  785.2.  ------------------------------------------------------------------- History: PMH: Coronary artery disease. PMH: Myocardial infarction. Risk factors: Former tobacco use. Dyslipidemia.  ------------------------------------------------------------------- Study Conclusions  - Left ventricle: The cavity size was normal. Wall thickness was  normal. Systolic function was vigorous. The estimated ejection  fraction was in the range of 65% to 70%. Doppler parameters are  consistent with abnormal left ventricular relaxation (grade 1  diastolic dysfunction). - Aortic valve: There was mild to moderate regurgitation. - Mitral valve: Calcified annulus. Mildly thickened leaflets .  _____________   History of Present Illness     Laura Herrera is a 65 y.o. female with a history of STEMI 01/2015 w/ difficult PCI RCA, mod dz OM. Hx depression, insomnia, anxiety.   Seen by Dr Herbie Baltimore 09/14/2015, post-MI angina was being treated with Ranexa, Toprol XL 25 mg, statin. Taken off ASA due to bruising, still on Effient. Taken off Imdur.  Today, pt realized she was late for Pilates and was moving more suddenly than usual. She was driving and developed chest pain, also with some numbness to L arm. She came to the ER and her pain was improved by IV Nitro, ASA and heparin. She has trouble rating it, but it is better. This is her usual angina. First episode of significance since meds were changed.   Hospital Course     Patient underwent cardiac catheterization on 11/10/2015 which showed Ost LAD lesion, 40% stenosed, Ost LM lesion, 30% stenosed,Prox Cx lesion, 20% stenosed, Mid LAD to Dist LAD lesion, 10% stenosed. RCA stent patent. His EF was 30% with wall motion abnormality concerning for Takotsubo cardiomyopathy, previous echocardiogram in 2016 shows normal EF. Echocardiogram was repeated on 11/11/2015 which shows normal EF 65-70%,  grade 1 diastolic dysfunction, mild-to-moderate AR.   She was  seen in the morning of 11/12/2015, at which time she denies any chest discomfort or shortness of breath. She does have significant amount of bruising at the cath site, however no bruit or pulsatile mass was noted on physical exam. She likely has a hematoma in that region. This should improve with time. Otherwise she is deemed stable for discharge from cardiology perspective. Of note, she does have a skin wound near the right antecubital region as result of blood pressure cuff malfunction in the ED. I have instructed her to use Neosporin as outpatient and keep the area clean and dry. We have added a low dose lisinopril as well. I have not restarted her on the aspirin given her easy bruising and stable coronary anatomy. I have arranged 2 weeks outpatient follow-up.  _____________  Discharge Vitals Blood pressure 122/27, pulse 79, temperature 97.9 F (36.6 C), temperature source Oral, resp. rate 18, height  (1.702 m), weight 177 lb 14.6 oz (80.7 kg), SpO2 95 %.  Filed Weights   11/09/15 2023 11/10/15 0600 11/12/15 0356  Weight: 174 lb 6.1 oz (79.1 kg) 174 lb 6.1 oz (79.1 kg) 177 lb 14.6 oz (80.7 kg)    Labs & Radiologic Studies     CBC  Recent Labs  11/11/15 0346 11/12/15 0400  WBC 5.9 6.8  HGB 9.4* 9.6*  HCT 30.1* 30.5*  MCV 86.0 86.2  PLT 240 241   Basic Metabolic Panel  Recent Labs  11/09/15 1638 11/10/15 0520  NA 139 141  K 3.9 4.0  CL 106 110  CO2 21* 20*  GLUCOSE 110* 100*  BUN 11 11  CREATININE 0.98 1.07*  CALCIUM 9.9 9.4   Cardiac Enzymes  Recent Labs  11/09/15 1951 11/10/15 0004 11/10/15 0629  TROPONINI 0.76* 0.66* 0.52*   Hemoglobin A1C  Recent Labs  11/09/15 1951  HGBA1C 5.9*   Fasting Lipid Panel  Recent Labs  11/10/15 0520  CHOL 149  HDL 73  LDLCALC 56  TRIG 98  CHOLHDL 2.0    Dg Chest 2 View  11/09/2015  CLINICAL DATA:  Onset of shortness of breath, LEFT arm pain and numbness with pain radiating to chest while driving today, history  coronary artery disease post MI and coronary stenting, hypertension, dyslipidemia, former smoker EXAM: CHEST  2 VIEW COMPARISON:  01/20/2015 FINDINGS: Normal heart size and pulmonary vascularity. Atherosclerotic calcification aorta. Small hiatal hernia again seen. Minimal peribronchial thickening without infiltrate, pleural effusion or pneumothorax. Bones demineralized. IMPRESSION: Small hiatal hernia. Minimal bronchitic changes without infiltrate. Electronically Signed   By: Ulyses Southward M.D.   On: 11/09/2015 17:27    Disposition   Pt is being discharged home today in good condition.  Follow-up Plans & Appointments    Follow-up Information    Follow up with Janetta Hora, PA-C On 11/25/2015.   Specialties:  Cardiology, Radiology   Why:  1:30PM. Cardiology followup. Beaware of different office location   Contact information:   [address] [place] [address] [phone]       Follow up with Marykay Lex, MD On 01/25/2016.   Specialty:  Cardiology   Why:  2:00PM. Cardiology followup   Contact information:   [address] [facility] [address] [place] [address] [phone]      Discharge Instructions    Amb Referral to Cardiac Rehabilitation    Complete by:  As directed   Diagnosis:  NSTEMI  Diet - low sodium heart healthy    Complete by:  As directed      Increase activity slowly    Complete by:  As directed            Discharge Medications   Current Discharge Medication List    START taking these medications   Details  isosorbide mononitrate (IMDUR) 30 MG 24 hr tablet Take 1 tablet (30 mg total) by mouth daily. Qty: 90 tablet, Refills: 3    lisinopril (PRINIVIL,ZESTRIL) 5 MG tablet Take 0.5 tablets (2.5 mg total) by mouth daily. Qty: 30 tablet, Refills: 5    neomycin-bacitracin-polymyxin (NEOSPORIN) ointment Apply 1 application topically every 12 (twelve) hours. Apply to wound      CONTINUE these medications which have NOT CHANGED    Details  acetaminophen (TYLENOL) 500 MG tablet Take 500 mg by mouth every 6 (six) hours as needed for headache.    atorvastatin (LIPITOR) 80 MG tablet TAKE 1 TABLET BY MOUTH EVERY DAY Qty: 30 tablet, Refills: 6    cholecalciferol (VITAMIN D) 1000 UNITS tablet Take 1,000 Units by mouth daily.    clobetasol cream (TEMOVATE) 0.05 % Apply 1 application topically 2 (two) times daily.    EFFIENT 10 MG TABS tablet Take 10 mg by mouth daily. Refills: 3    furosemide (LASIX) 20 MG tablet Take 0.5 tablets (10 mg total) by mouth as needed. Qty: 30 tablet, Refills: 6    metoprolol succinate (TOPROL-XL) 25 MG 24 hr tablet Take 0.5 tablets (12.5 mg total) by mouth daily. Qty: 45 tablet, Refills: 3    nitroGLYCERIN (NITROSTAT) 0.4 MG SL tablet Place 1 tablet (0.4 mg total) under the tongue every 5 (five) minutes x 3 doses as needed for chest pain. Qty: 25 tablet, Refills: 3    pantoprazole (PROTONIX) 40 MG tablet TAKE 1 TABLET BY MOUTH DAILY AT 12 NOON. Qty: 30 tablet, Refills: 6    ranolazine (RANEXA) 500 MG 12 hr tablet Take 1 tablet (500 mg total) by mouth 2 (two) times daily. Qty: 60 tablet, Refills: 6          Outstanding Labs/Studies   None  Duration of Discharge Encounter   Greater than 30 minutes including physician time.  Signed, Azalee Course PA-C 11/12/2015, 10:09 AM

## 2015-11-12 NOTE — Care Management Note (Signed)
Case Management Note  Patient Details  Name: Laura Herrera MRN: [id number] Date of Birth: 1950/12/20  Subjective/Objective:     Patient for dc today, already on Effient pta, also indep, no needs.               Action/Plan:   Expected Discharge Date:                  Expected Discharge Plan:  Home/Self Care  In-House Referral:     Discharge planning Services  CM Consult  Post Acute Care Choice:    Choice offered to:     DME Arranged:    DME Agency:     HH Arranged:    HH Agency:     Status of Service:  Completed, signed off  Medicare Important Message Given:    Date Medicare IM Given:    Medicare IM give by:    Date Additional Medicare IM Given:    Additional Medicare Important Message give by:     If discussed at Long Length of Stay Meetings, dates discussed:    Additional Comments:  Leone Haven, RN 11/12/2015, 11:06 AM

## 2015-11-12 NOTE — Progress Notes (Signed)
CARDIAC REHAB PHASE I   PRE:  Rate/Rhythm: 64 SR  BP:  Sitting: 122/27 automatic, 98/46 manual        SaO2: 98 RA  MODE:  Ambulation: 1000 ft   POST:  Rate/Rhythm: 79 SR  BP:  Sitting: 122/60 manual         SaO2: 100 RA  Pt ambulated 1000 ft on RA, independent, steady gait, tolerated well, no complaints. Reinforced yesterday's education, discussed echo/Takotsubo cardiomyopathy. Pt still somewhat anxious, but states she feels better. BP improved with ambulation. Pt to bed per pt request after walk, call bell within reach. CRP2 referral sent to [facility].   [id number] Joylene Grapes, RN, BSN 11/12/2015 8:29 AM

## 2015-11-13 ENCOUNTER — Other Ambulatory Visit: Payer: Self-pay | Admitting: Nurse Practitioner

## 2015-11-15 ENCOUNTER — Encounter (HOSPITAL_COMMUNITY): Payer: Self-pay

## 2015-11-15 NOTE — Telephone Encounter (Signed)
REFILL 

## 2015-11-16 ENCOUNTER — Telehealth: Payer: Self-pay | Admitting: Cardiology

## 2015-11-16 ENCOUNTER — Telehealth (HOSPITAL_COMMUNITY): Payer: Self-pay | Admitting: *Deleted

## 2015-11-16 NOTE — Telephone Encounter (Signed)
Left msg for pt to call. 

## 2015-11-16 NOTE — Telephone Encounter (Signed)
Returning your call. °

## 2015-11-16 NOTE — Telephone Encounter (Signed)
New message    Cardiac rehab calling   Patient need a called regarding blood pressure may be medication related - that happen on yesterday.

## 2015-11-16 NOTE — Telephone Encounter (Signed)
Returned call. Pt explains she was showering after discharge from hospital, had put water heat higher than typical -has waist length hair and was brushing it out and shampooing after several days in hospital w/o shower. Felt a little dizzy when raising arms above head. Denies syncope. Dizziness/lightheadedness resolved. BP when taken after shower was 101/47. Notes today 120/59.  She is on imdur 30mg  daily and lisinopril 2.5mg  daily, new meds for her since discharge. I noted the imdur and or lisinopril may need adjustment at next visit, but no action for now.  Recommended pt call if this happens again, & lower water temp for bathing/showering. Keep track of BPs in a diary & f/u as scheduled w/ Philomena Course on 4/20.  Pt voiced understanding of recommendations.

## 2015-11-17 ENCOUNTER — Encounter (HOSPITAL_COMMUNITY): Payer: Self-pay

## 2015-11-18 ENCOUNTER — Emergency Department (HOSPITAL_COMMUNITY): Payer: BLUE CROSS/BLUE SHIELD

## 2015-11-18 ENCOUNTER — Encounter (HOSPITAL_COMMUNITY): Payer: Self-pay | Admitting: Emergency Medicine

## 2015-11-18 ENCOUNTER — Observation Stay (HOSPITAL_COMMUNITY)
Admission: EM | Admit: 2015-11-18 | Discharge: 2015-11-19 | Disposition: A | Discharge status: Home / Self Care | Payer: BLUE CROSS/BLUE SHIELD | Attending: Cardiology | Admitting: Cardiology

## 2015-11-18 DIAGNOSIS — R079 Chest pain, unspecified: Secondary | ICD-10-CM | POA: Diagnosis present

## 2015-11-18 DIAGNOSIS — Z955 Presence of coronary angioplasty implant and graft: Secondary | ICD-10-CM | POA: Diagnosis not present

## 2015-11-18 DIAGNOSIS — F419 Anxiety disorder, unspecified: Secondary | ICD-10-CM | POA: Diagnosis not present

## 2015-11-18 DIAGNOSIS — I2510 Atherosclerotic heart disease of native coronary artery without angina pectoris: Secondary | ICD-10-CM | POA: Diagnosis not present

## 2015-11-18 DIAGNOSIS — R200 Anesthesia of skin: Secondary | ICD-10-CM | POA: Diagnosis not present

## 2015-11-18 DIAGNOSIS — I252 Old myocardial infarction: Secondary | ICD-10-CM | POA: Diagnosis not present

## 2015-11-18 DIAGNOSIS — I10 Essential (primary) hypertension: Secondary | ICD-10-CM | POA: Diagnosis not present

## 2015-11-18 DIAGNOSIS — F329 Major depressive disorder, single episode, unspecified: Secondary | ICD-10-CM | POA: Insufficient documentation

## 2015-11-18 DIAGNOSIS — E785 Hyperlipidemia, unspecified: Secondary | ICD-10-CM | POA: Diagnosis not present

## 2015-11-18 DIAGNOSIS — Z87891 Personal history of nicotine dependence: Secondary | ICD-10-CM | POA: Diagnosis not present

## 2015-11-18 DIAGNOSIS — R0789 Other chest pain: Secondary | ICD-10-CM | POA: Diagnosis not present

## 2015-11-18 DIAGNOSIS — Z8614 Personal history of Methicillin resistant Staphylococcus aureus infection: Secondary | ICD-10-CM | POA: Diagnosis not present

## 2015-11-18 HISTORY — DX: Chest pain, unspecified: R07.9

## 2015-11-18 LAB — BASIC METABOLIC PANEL
ANION GAP: 12 (ref 5–15)
BUN: 22 mg/dL — ABNORMAL HIGH (ref 6–20)
CALCIUM: 9.8 mg/dL (ref 8.9–10.3)
CHLORIDE: 107 mmol/L (ref 101–111)
CO2: 19 mmol/L — AB (ref 22–32)
Creatinine, Ser: 1.22 mg/dL — ABNORMAL HIGH (ref 0.44–1.00)
GFR calc non Af Amer: 46 mL/min — ABNORMAL LOW (ref >60)
GFR, EST AFRICAN AMERICAN: 53 mL/min — AB (ref >60)
Glucose, Bld: 104 mg/dL — ABNORMAL HIGH (ref 65–99)
Potassium: 4.3 mmol/L (ref 3.5–5.1)
SODIUM: 138 mmol/L (ref 135–145)

## 2015-11-18 LAB — CBC
HCT: 32.7 % — ABNORMAL LOW (ref 36.0–46.0)
HEMOGLOBIN: 10.4 g/dL — AB (ref 12.0–15.0)
MCH: 27.6 pg (ref 26.0–34.0)
MCHC: 31.8 g/dL (ref 30.0–36.0)
MCV: 86.7 fL (ref 78.0–100.0)
Platelets: 348 10*3/uL (ref 150–400)
RBC: 3.77 MIL/uL — AB (ref 3.87–5.11)
RDW: 16.7 % — ABNORMAL HIGH (ref 11.5–15.5)
WBC: 7.5 10*3/uL (ref 4.0–10.5)

## 2015-11-18 LAB — PROTIME-INR
INR: 0.97 (ref 0.00–1.49)
PROTHROMBIN TIME: 13.1 s (ref 11.6–15.2)

## 2015-11-18 NOTE — ED Provider Notes (Signed)
CSN: [id number]     Arrival date & time 11/18/15  2242 History   First MD Initiated Contact with Patient 11/18/15 2248     Chief Complaint  Patient presents with  . Chest Pain     (Consider location/radiation/quality/duration/timing/severity/associated sxs/prior Treatment) Patient is a 65 y.o. female presenting with chest pain. The history is provided by the patient and medical records.  Chest Pain Associated symptoms: shortness of breath     65 year old female with history of MRSA, anxiety, coronary artery disease with recent NSTEMI on 11/09/15, hypertension, hyperlipidemia, prior MI, presenting  to the ED for chest pain. Patient was just released from the hospital on Friday after admission for NSTEMI.  She states overall she has been doing well since discharge from the hospital. 2 additional medications were ordered for her which she reportedly is making her somewhat fatigued and lightheaded. States this is normal with new medications. She states she is not having any chest pain until today. Pain began approximately 1 hour prior to calling EMS. She reports central chest pressure with some mild shortness of breath. She denies any diaphoresis, nausea, vomiting, dizziness, or confusion. She reports she does also have some mild left hand numbness. She states the nature of her pain today is vastly different from her pain last week, nonetheless given recent events it made her concerned that she called EMS. Patient did have a left heart cath performed last week, her prior stent to RCA was patent and there was no other significant stenosis noted. Patient does continue to have significant bruising of her right groin from the catheter insertion.  On arrival, patient has minimal chest pain which she reports is a 2 out of 10. She was given 325 aspirin and SL NTG PTA without relief of symptoms.  Patient does take daily effient.  Patient is followed by [facility]-- has been Dr. Herbie Baltimore and Dr. Tresa Endo.  Past Medical  History  Diagnosis Date  . Psoriasis     hand and foot (11/09/2015)  . MRSA infection     h/o MRSA infection in the right foot  . Anxiety   . Depression   . Insomnia   . Coronary artery disease     a. 01/2015 Inf STEMI/PCI: RCA 80ost (2.75x12 Promus Premier DES), 100p (2.5x24 Promus Premier DES), 70p/m (2.5x24 Promus Premier DES), 90d (PTCA);  b. 01/2015 Relook Cath: LM nl, LAD 65m, D1 min irregs, LCX nl- L->R collats,  OM1 mod dzs, OM2 mild dizs, RCA 10ost, heavy thrombus p/m, 10d->Aggrastat;  c. 01/2015 Echo: EF 60-65%, no rwma, mod AI, PASP .  Marland Kitchen Hypertension   . Dyslipidemia   . Kidney stones     "years ago; passed it"  . Complication of anesthesia     pseudo cholinesterace deficiency  . PONV (postoperative nausea and vomiting)   . Pseudocholinesterase deficiency   . Myocardial infarction (HCC) 01/2015  . NSTEMI (non-ST elevated myocardial infarction) (HCC) 11/09/2015  . Headache     "used to have them daily; I no longer do" (11/09/2015)   Past Surgical History  Procedure Laterality Date  . Angioplasty  01/12/2015    Procedure: Angioplasty;  Surgeon: Lyn Records, MD;  Location: [facility] INVASIVE CV LAB;  Service: Cardiovascular;;  . Appendectomy    . Dilation and curettage of uterus  1979    S/P miscarriage  . Cardiac catheterization N/A 01/11/2015    Procedure: Left Heart Cath and Coronary Angiography;  Surgeon: Marykay Lex, MD;  Location: [facility] INVASIVE CV LAB;  Service: Cardiovascular;  Laterality: N/A;  . Cardiac catheterization  01/11/2015    Procedure: Coronary Stent Intervention;  Surgeon: Marykay Lex, MD;  Location: [facility] INVASIVE CV LAB;  Service: Cardiovascular;;  . Cardiac catheterization  01/11/2015    Procedure: Coronary Balloon Angioplasty;  Surgeon: Marykay Lex, MD;  Location: [facility] INVASIVE CV LAB;  Service: Cardiovascular;;  . Cardiac catheterization N/A 01/12/2015    Procedure: Left Heart Cath and Coronary Angiography;  Surgeon: Lyn Records, MD;  Location: [facility] INVASIVE CV  LAB;  Service: Cardiovascular;  Laterality: N/A;  . Nose surgery  X 2    "to remove genetic bone spur"  . Incision and drainage foot Right     "spider bite"  . Cardiac catheterization N/A 11/10/2015    Procedure: Left Heart Cath and Coronary Angiography;  Surgeon: Lennette Bihari, MD;  Location: [facility] INVASIVE CV LAB;  Service: Cardiovascular;  Laterality: N/A;  . Cardiac catheterization N/A 11/10/2015    Procedure: Intravascular Pressure Wire/FFR Study;  Surgeon: Lennette Bihari, MD;  Location: [facility] INVASIVE CV LAB;  Service: Cardiovascular;  Laterality: N/A;   Family History  Problem Relation Age of Onset  . Peripheral vascular disease Mother   . Hyperlipidemia Mother   . Alzheimer's disease Father   . Mental illness Sister   . Heart Problems Sister   . Schizophrenia Sister   . Hypotension Sister   . Hyperlipidemia Brother   . Heart disease Maternal Grandmother    Social History  Substance Use Topics  . Smoking status: Former Smoker -- 0.10 packs/day for 2 years    Types: Cigarettes  . Smokeless tobacco: Never Used     Comment: "quit smoking in the 1980s"  . Alcohol Use: 1.2 oz/week    2 Glasses of wine per week   OB History    No data available     Review of Systems  Respiratory: Positive for shortness of breath.   Cardiovascular: Positive for chest pain.  All other systems reviewed and are negative.     Allergies  Cholinesterase inhibitors; Other; Shellfish-derived products; Vancomycin; and Epinephrine  Home Medications   Prior to Admission medications   Medication Sig Start Date End Date Taking? Authorizing Provider  acetaminophen (TYLENOL) 500 MG tablet Take 500 mg by mouth every 6 (six) hours as needed for headache.    Historical Provider, MD  atorvastatin (LIPITOR) 80 MG tablet TAKE 1 TABLET BY MOUTH EVERY DAY 07/23/15   Marykay Lex, MD  cholecalciferol (VITAMIN D) 1000 UNITS tablet Take 1,000 Units by mouth daily.    Historical Provider, MD  clobetasol cream  (TEMOVATE) 0.05 % Apply 1 application topically 2 (two) times daily.    Historical Provider, MD  EFFIENT 10 MG TABS tablet Take 10 mg by mouth daily. 05/22/15   Historical Provider, MD  furosemide (LASIX) 20 MG tablet Take 0.5 tablets (10 mg total) by mouth as needed. Patient taking differently: Take 20 mg by mouth daily as needed for fluid.  02/10/15   Rosalio Macadamia, NP  isosorbide mononitrate (IMDUR) 30 MG 24 hr tablet Take 1 tablet (30 mg total) by mouth daily. 11/12/15   Azalee Course, PA  lisinopril (PRINIVIL,ZESTRIL) 5 MG tablet Take 0.5 tablets (2.5 mg total) by mouth daily. 11/12/15   Azalee Course, PA  metoprolol succinate (TOPROL-XL) 25 MG 24 hr tablet Take 0.5 tablets (12.5 mg total) by mouth daily. 09/14/15   Marykay Lex, MD  neomycin-bacitracin-polymyxin (NEOSPORIN) ointment Apply 1 application topically every  12 (twelve) hours. Apply to wound 11/12/15   Azalee Course, PA  nitroGLYCERIN (NITROSTAT) 0.4 MG SL tablet Place 1 tablet (0.4 mg total) under the tongue every 5 (five) minutes x 3 doses as needed for chest pain. 01/17/15   Ok Anis, NP  pantoprazole (PROTONIX) 40 MG tablet TAKE 1 TABLET BY MOUTH DAILY AT 12 NOON. 03/15/15   Marykay Lex, MD  ranolazine (RANEXA) 500 MG 12 hr tablet Take 1 tablet (500 mg total) by mouth 2 (two) times daily. KEEP OV. 11/15/15   Rosalio Macadamia, NP   BP 136/51 mmHg  Pulse 67  Temp(Src) 98.4 F (36.9 C) (Oral)  Resp 14  Ht 5\' 7"  (1.702 m)  Wt 76.658 kg  BMI 26.46 kg/m2  SpO2 100%   Physical Exam  Constitutional: She is oriented to person, place, and time. She appears well-developed and well-nourished.  HENT:  Head: Normocephalic and atraumatic.  Mouth/Throat: Oropharynx is clear and moist.  Eyes: Conjunctivae and EOM are normal. Pupils are equal, round, and reactive to light.  Neck: Normal range of motion.  Cardiovascular: Normal rate, regular rhythm and normal heart sounds.   Pulmonary/Chest: Effort normal and breath sounds normal. No respiratory  distress. She has no wheezes.  Abdominal: Soft. Bowel sounds are normal.  Genitourinary:  Extensive bruising of right groin without hematoma; no signs of infection to catheter entrance site; strong femoral pulse present  Musculoskeletal: Normal range of motion.  Neurological: She is alert and oriented to person, place, and time.  Skin: Skin is warm and dry.  Psychiatric: She has a normal mood and affect.  Nursing note and vitals reviewed.   ED Course  Procedures (including critical care time) Labs Review Labs Reviewed  BASIC METABOLIC PANEL - Abnormal; Notable for the following:    CO2 19 (*)    Glucose, Bld 104 (*)    BUN 22 (*)    Creatinine, Ser 1.22 (*)    GFR calc non Af Amer 46 (*)    GFR calc Af Amer 53 (*)    All other components within normal limits  CBC - Abnormal; Notable for the following:    RBC 3.77 (*)    Hemoglobin 10.4 (*)    HCT 32.7 (*)    RDW 16.7 (*)    All other components within normal limits  PROTIME-INR  TROPONIN I  I-STAT TROPOININ, ED    Imaging Review Dg Chest 2 View  11/19/2015  CLINICAL DATA:  Mid chest pressure, onset today. Left upper extremity paresthesias. EXAM: CHEST  2 VIEW COMPARISON:  11/09/2015 FINDINGS: The heart size and mediastinal contours are within normal limits. Both lungs are clear. The visualized skeletal structures are unremarkable. IMPRESSION: No active cardiopulmonary disease. Electronically Signed   By: Ellery Plunk M.D.   On: 11/19/2015 00:24   I have personally reviewed and evaluated these images and lab results as part of my medical decision-making.   EKG Interpretation   Date/Time:  Thursday November 18 2015 22:48:16 EDT Ventricular Rate:  60 PR Interval:  154 QRS Duration: 98 QT Interval:  452 QTC Calculation: 452 R Axis:     Text Interpretation:  Sinus rhythm Abnrm T, consider ischemia,  anterolateral lds Confirmed by [facility]  MD, APRIL ([id number]) on  11/18/2015 11:54:10 PM      MDM   Final  diagnoses:  Chest pain, unspecified chest pain type   65 year old female here with chest pain. Recent and STEMI. Left heart cath was performed during admission,  patent RCA stent without any new significant occlusions. Patient discharged on Friday was overall doing well until this evening. Reports central chest pressure, rates 2/10.  Also reports some left arm numbness. EKG with continued inverted T waves in anterior lateral leads. No new ischemic changes noted. Labwork is reassuring, troponin negative. Chest x-ray is clear.  12:37 AM On re-check reports her chest pain has mostly resolved.  VS remain stable.  Does report some continued numbness in her left arm.  Discussed test results.  Patient remains concerned given recent events which is understandable.  Will discuss with cardiology for recommendations.  1:05 AM Case discussed with cardiology-- Dr. Alphonsus Sias.  Recommends re-draw troponin in 1 hour.  He will come evaluate to determine if repeat admission warranted.  Care signed out to Dr. Nicanor Alcon at this time.  Garlon Hatchet, PA-C 11/19/15 0109  Arby Barrette, MD 11/27/15 [phone]

## 2015-11-18 NOTE — ED Notes (Signed)
Pt in EMS reporting central CP started approx 1hr ago. Pt was admitted here last week for NSTEMI. Received 3NTG, 324 ASA. Pain did not improve with NTG.

## 2015-11-19 ENCOUNTER — Encounter (HOSPITAL_COMMUNITY): Payer: Self-pay

## 2015-11-19 ENCOUNTER — Encounter (HOSPITAL_COMMUNITY): Payer: Self-pay | Admitting: Physician Assistant

## 2015-11-19 DIAGNOSIS — I252 Old myocardial infarction: Secondary | ICD-10-CM | POA: Diagnosis not present

## 2015-11-19 DIAGNOSIS — R0789 Other chest pain: Secondary | ICD-10-CM | POA: Diagnosis not present

## 2015-11-19 DIAGNOSIS — R079 Chest pain, unspecified: Secondary | ICD-10-CM | POA: Diagnosis not present

## 2015-11-19 DIAGNOSIS — F419 Anxiety disorder, unspecified: Secondary | ICD-10-CM | POA: Diagnosis not present

## 2015-11-19 DIAGNOSIS — I2510 Atherosclerotic heart disease of native coronary artery without angina pectoris: Secondary | ICD-10-CM | POA: Diagnosis not present

## 2015-11-19 HISTORY — DX: Chest pain, unspecified: R07.9

## 2015-11-19 LAB — I-STAT TROPONIN, ED
TROPONIN I, POC: 0.01 ng/mL (ref 0.00–0.08)
Troponin i, poc: 0 ng/mL (ref 0.00–0.08)

## 2015-11-19 LAB — TROPONIN I

## 2015-11-19 MED ORDER — METOPROLOL SUCCINATE ER 25 MG PO TB24
25.0000 mg | ORAL_TABLET | Freq: Every day | ORAL | Status: DC
  Filled 2015-11-19: qty 1

## 2015-11-19 MED ORDER — ISOSORBIDE MONONITRATE ER 30 MG PO TB24
30.0000 mg | ORAL_TABLET | Freq: Every day | ORAL | Status: DC
  Filled 2015-11-19: qty 1

## 2015-11-19 MED ORDER — ATORVASTATIN CALCIUM 80 MG PO TABS
80.0000 mg | ORAL_TABLET | Freq: Every day | ORAL | Status: DC
  Administered 2015-11-19: 80 mg via ORAL
  Filled 2015-11-19: qty 1

## 2015-11-19 MED ORDER — RANOLAZINE ER 500 MG PO TB12: 500.0000 mg | ORAL_TABLET | Freq: Two times a day (BID) | ORAL | Status: DC

## 2015-11-19 MED ORDER — PANTOPRAZOLE SODIUM 40 MG PO TBEC
40.0000 mg | DELAYED_RELEASE_TABLET | Freq: Every day | ORAL | Status: DC
Start: 2015-11-19 — End: 2015-11-19
  Filled 2015-11-19: qty 1

## 2015-11-19 MED ORDER — PRASUGREL HCL 10 MG PO TABS
10.0000 mg | ORAL_TABLET | Freq: Once | ORAL | Status: AC
  Administered 2015-11-19: 10 mg via ORAL
  Filled 2015-11-19: qty 1

## 2015-11-19 MED ORDER — PRASUGREL HCL 10 MG PO TABS: 10.0000 mg | ORAL_TABLET | Freq: Every day | ORAL | Status: DC

## 2015-11-19 MED ORDER — ISOSORBIDE MONONITRATE ER 30 MG PO TB24: 30.0000 mg | ORAL_TABLET | Freq: Every day | ORAL | Status: DC

## 2015-11-19 MED ORDER — ONDANSETRON HCL 4 MG/2ML IJ SOLN: 4.0000 mg | Freq: Four times a day (QID) | INTRAMUSCULAR | Status: DC | PRN

## 2015-11-19 MED ORDER — RANOLAZINE ER 500 MG PO TB12
500.0000 mg | ORAL_TABLET | Freq: Two times a day (BID) | ORAL | Status: DC
  Administered 2015-11-19: 500 mg via ORAL
  Filled 2015-11-19 (×2): qty 1

## 2015-11-19 MED ORDER — ISOSORBIDE MONONITRATE ER 30 MG PO TB24
30.0000 mg | ORAL_TABLET | Freq: Every day | ORAL | Status: DC
  Administered 2015-11-19: 30 mg via ORAL
  Filled 2015-11-19: qty 1

## 2015-11-19 MED ORDER — LISINOPRIL 5 MG PO TABS
5.0000 mg | ORAL_TABLET | Freq: Once | ORAL | Status: DC
  Filled 2015-11-19: qty 1

## 2015-11-19 MED ORDER — METOPROLOL SUCCINATE ER 25 MG PO TB24
12.5000 mg | ORAL_TABLET | Freq: Every day | ORAL | Status: DC
  Administered 2015-11-19: 12.5 mg via ORAL
  Filled 2015-11-19: qty 1

## 2015-11-19 MED ORDER — MORPHINE SULFATE (PF) 4 MG/ML IV SOLN
4.0000 mg | Freq: Once | INTRAVENOUS | Status: DC
  Filled 2015-11-19: qty 1

## 2015-11-19 MED ORDER — ACETAMINOPHEN 325 MG PO TABS: 650.0000 mg | ORAL_TABLET | ORAL | Status: DC | PRN

## 2015-11-19 MED ORDER — LISINOPRIL 2.5 MG PO TABS
2.5000 mg | ORAL_TABLET | Freq: Every day | ORAL | Status: DC
  Administered 2015-11-19: 2.5 mg via ORAL
  Filled 2015-11-19: qty 1

## 2015-11-19 MED ORDER — VITAMIN D 1000 UNITS PO TABS
1000.0000 [IU] | ORAL_TABLET | Freq: Every day | ORAL | Status: DC
  Filled 2015-11-19: qty 1

## 2015-11-19 NOTE — ED Notes (Signed)
Cardiology at bedside.

## 2015-11-19 NOTE — H&P (Signed)
History & Physical    Patient ID: Laura Herrera MRN: [id number], DOB/AGE: [birth year]   Admit date: 11/18/2015   Primary Physician: Darrow Bussing, MD Primary Cardiologist: Dr. Herbie Baltimore  Patient Profile    [phone] with CAD s/p STEMI (01/2015, RCA PCI) with post MI angina, depression, anxiety, and recent hospitalization for Takotsubo's who presents with CP.   Past Medical History    Past Medical History  Diagnosis Date  . Psoriasis     hand and foot (11/09/2015)  . MRSA infection     h/o MRSA infection in the right foot  . Anxiety   . Depression   . Insomnia   . Coronary artery disease     a. 01/2015 Inf STEMI/PCI: RCA 80ost (2.75x12 Promus Premier DES), 100p (2.5x24 Promus Premier DES), 70p/m (2.5x24 Promus Premier DES), 90d (PTCA);  b. 01/2015 Relook Cath: LM nl, LAD 55m, D1 min irregs, LCX nl- L->R collats,  OM1 mod dzs, OM2 mild dizs, RCA 10ost, heavy thrombus p/m, 10d->Aggrastat;  c. 01/2015 Echo: EF 60-65%, no rwma, mod AI, PASP .  Marland Kitchen Hypertension   . Dyslipidemia   . Kidney stones     "years ago; passed it"  . Complication of anesthesia     pseudo cholinesterace deficiency  . PONV (postoperative nausea and vomiting)   . Pseudocholinesterase deficiency   . Myocardial infarction (HCC) 01/2015  . NSTEMI (non-ST elevated myocardial infarction) (HCC) 11/09/2015  . Headache     "used to have them daily; I no longer do" (11/09/2015)    Past Surgical History  Procedure Laterality Date  . Angioplasty  01/12/2015    Procedure: Angioplasty;  Surgeon: Lyn Records, MD;  Location: [facility] INVASIVE CV LAB;  Service: Cardiovascular;;  . Appendectomy    . Dilation and curettage of uterus  1979    S/P miscarriage  . Cardiac catheterization N/A 01/11/2015    Procedure: Left Heart Cath and Coronary Angiography;  Surgeon: Marykay Lex, MD;  Location: [facility] INVASIVE CV LAB;  Service: Cardiovascular;  Laterality: N/A;  . Cardiac catheterization  01/11/2015    Procedure: Coronary Stent Intervention;   Surgeon: Marykay Lex, MD;  Location: [facility] INVASIVE CV LAB;  Service: Cardiovascular;;  . Cardiac catheterization  01/11/2015    Procedure: Coronary Balloon Angioplasty;  Surgeon: Marykay Lex, MD;  Location: [facility] INVASIVE CV LAB;  Service: Cardiovascular;;  . Cardiac catheterization N/A 01/12/2015    Procedure: Left Heart Cath and Coronary Angiography;  Surgeon: Lyn Records, MD;  Location: [facility] INVASIVE CV LAB;  Service: Cardiovascular;  Laterality: N/A;  . Nose surgery  X 2    "to remove genetic bone spur"  . Incision and drainage foot Right     "spider bite"  . Cardiac catheterization N/A 11/10/2015    Procedure: Left Heart Cath and Coronary Angiography;  Surgeon: Lennette Bihari, MD;  Location: [facility] INVASIVE CV LAB;  Service: Cardiovascular;  Laterality: N/A;  . Cardiac catheterization N/A 11/10/2015    Procedure: Intravascular Pressure Wire/FFR Study;  Surgeon: Lennette Bihari, MD;  Location: [facility] INVASIVE CV LAB;  Service: Cardiovascular;  Laterality: N/A;     Allergies  Allergies  Allergen Reactions  . 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  . Other Other (See Comments)    Sensitive to the electrodes  . Shellfish-Derived Products Other (See Comments)    Crabs, lobster, crayfish  . Vancomycin     REACTION: severe rash/hives  .  Epinephrine Palpitations    Palpitations and involuntary body jerking    History of Present Illness     Laura Herrera is a 65 y.o. female with a history of STEMI 01/2015 w/ difficult PCI RCA, mod dz OM. Hx depression, insomnia, anxiety. She had post-MI angina and is being treated with Ranexa, Toprol XL 25 mg, statin. Taken off ASA due to bruising, still on Effient. She was admitted to [facility] from 4/4-4/7 for Takotsubo's. She underwent cath on4/12/2015 which showed Ost LAD lesion, 40% stenosed, Ost LM lesion, 30% stenosed,Prox Cx lesion, 20% stenosed, Mid LAD to Dist LAD  lesion, 10% stenosed. RCA stent patent. Her EF was 30% with wall motion abnormality concerning for Takotsubo cardiomyopathy, previous echocardiogram in 2016 shows normal EF. Echocardiogram was repeated on 11/11/2015 which shows normal EF 65-70%, grade 1 diastolic dysfunction, mild-to-moderate AR. Lisinopril 2.5 and imdur wer added to her meds at discharge. Aside from some orthostasis, she had initially done well after discharge. She reports that today, after driving her car, she developed left  arm and finger tingling and then eventually chest tightness (2/10 in severity). She states that these symptoms are somewhat didferen from her MI and Takotsubo presentations. Because of this, she presented to the ER.   On arrival to the ER, she was hemodynamically stable.  K 4.3, Cr 1.22, POC TnI 0.01 and 0.00. WBC 7.5, hct 32 (stable). CXR demonstated no acute disease. ECG 11/18/15 @ 22:48: NSR. PVC, anterior TWI V1-V3 and aVL. TWI are new compared to 11/09/15.   Home Medications    Prior to Admission medications   Medication Sig Start Date End Date Taking? Authorizing Provider  acetaminophen (TYLENOL) 500 MG tablet Take 500 mg by mouth every 6 (six) hours as needed for headache.   Yes Historical Provider, MD  atorvastatin (LIPITOR) 80 MG tablet TAKE 1 TABLET BY MOUTH EVERY DAY 07/23/15  Yes Marykay Lex, MD  cholecalciferol (VITAMIN D) 1000 UNITS tablet Take 1,000 Units by mouth daily.   Yes Historical Provider, MD  clobetasol cream (TEMOVATE) 0.05 % Apply 1 application topically 2 (two) times daily.   Yes Historical Provider, MD  EFFIENT 10 MG TABS tablet Take 10 mg by mouth daily. 05/22/15  Yes Historical Provider, MD  furosemide (LASIX) 20 MG tablet Take 0.5 tablets (10 mg total) by mouth as needed. Patient taking differently: Take 20 mg by mouth daily as needed for fluid.  02/10/15  Yes Rosalio Macadamia, NP  isosorbide mononitrate (IMDUR) 30 MG 24 hr tablet Take 1 tablet (30 mg total) by mouth daily. 11/12/15   Yes Azalee Course, PA  lisinopril (PRINIVIL,ZESTRIL) 5 MG tablet Take 0.5 tablets (2.5 mg total) by mouth daily. 11/12/15  Yes Azalee Course, PA  metoprolol succinate (TOPROL-XL) 25 MG 24 hr tablet Take 0.5 tablets (12.5 mg total) by mouth daily. 09/14/15  Yes Marykay Lex, MD  nitroGLYCERIN (NITROSTAT) 0.4 MG SL tablet Place 1 tablet (0.4 mg total) under the tongue every 5 (five) minutes x 3 doses as needed for chest pain. 01/17/15  Yes Ok Anis, NP  pantoprazole (PROTONIX) 40 MG tablet TAKE 1 TABLET BY MOUTH DAILY AT 12 NOON. 03/15/15  Yes Marykay Lex, MD  ranolazine (RANEXA) 500 MG 12 hr tablet Take 1 tablet (500 mg total) by mouth 2 (two) times daily. KEEP OV. 11/15/15  Yes Rosalio Macadamia, NP    Family History    Family History  Problem Relation Age of Onset  . Peripheral  vascular disease Mother   . Hyperlipidemia Mother   . Alzheimer's disease Father   . Mental illness Sister   . Heart Problems Sister   . Schizophrenia Sister   . Hypotension Sister   . Hyperlipidemia Brother   . Heart disease Maternal Grandmother     Social History    Social History   Social History  . Marital Status: Widowed    Spouse Name: N/A  . Number of Children: N/A  . Years of Education: N/A   Occupational History  . unemployed    Social History Main Topics  . Smoking status: Former Smoker -- 0.10 packs/day for 2 years    Types: Cigarettes  . Smokeless tobacco: Never Used     Comment: "quit smoking in the 1980s"  . Alcohol Use: 1.2 oz/week    2 Glasses of wine per week  . Drug Use: No  . Sexual Activity: Yes    Birth Control/ Protection: Post-menopausal   Other Topics Concern  . Not on file   Social History Narrative   She is under a significant amount of stress socially.    Her husband committed suicide several years ago - she continues to have difficulty with coping.     Review of Systems    General:  No chills, fever, night sweats or weight changes.  Cardiovascular:  See  HPI Dermatological: No rash, lesions/masses Respiratory: No cough, dyspnea Urologic: No hematuria, dysuria Abdominal:   No nausea, vomiting, diarrhea, bright red blood per rectum, melena, or hematemesis Neurologic:  No visual changes, wkns, changes in mental status. + L hand/finger tingling All other systems reviewed and are otherwise negative except as noted above.  Physical Exam    Blood pressure 140/57, pulse 61, temperature 98.4 F (36.9 C), temperature source Oral, resp. rate 19, height  (1.702 m), weight 76.658 kg (169 lb), SpO2 100 %.  General: anxious Psych: Normal affect. Neuro: Alert and oriented X 3. Moves all extremities spontaneously. HEENT: Normal  Neck: Supple without bruits or JVD. Lungs:  Resp regular and unlabored, CTA. Heart: RRR no s3, s4, or murmurs. Abdomen: Soft, non-tender, non-distended, BS + x 4.  Extremities: No clubbing, cyanosis or edema. DP/PT/Radials 2+ and equal bilaterally.  Labs    Troponin [facility] of Care Test)  Recent Labs  11/19/15 0208  TROPIPOC 0.00   No results for input(s): CKTOTAL, CKMB, TROPONINI in the last 72 hours. Lab Results  Component Value Date   WBC 7.5 11/18/2015   HGB 10.4* 11/18/2015   HCT 32.7* 11/18/2015   MCV 86.7 11/18/2015   PLT 348 11/18/2015    Recent Labs Lab 11/18/15 2256  NA 138  K 4.3  CL 107  CO2 19*  BUN 22*  CREATININE 1.22*  CALCIUM 9.8  GLUCOSE 104*   Lab Results  Component Value Date   CHOL 149 11/10/2015   HDL 73 11/10/2015   LDLCALC 56 11/10/2015   TRIG 98 11/10/2015   No results found for: [facility]   Radiology Studies    Dg Chest 2 View  11/19/2015  CLINICAL DATA:  Mid chest pressure, onset today. Left upper extremity paresthesias. EXAM: CHEST  2 VIEW COMPARISON:  11/09/2015 FINDINGS: The heart size and mediastinal contours are within normal limits. Both lungs are clear. The visualized skeletal structures are unremarkable. IMPRESSION: No active cardiopulmonary disease.  Electronically Signed   By: Ellery Plunk M.D.   On: 11/19/2015 00:24   Dg Chest 2 View  11/09/2015  CLINICAL DATA:  Onset of shortness of breath, LEFT arm pain and numbness with pain radiating to chest while driving today, history coronary artery disease post MI and coronary stenting, hypertension, dyslipidemia, former smoker EXAM: CHEST  2 VIEW COMPARISON:  01/20/2015 FINDINGS: Normal heart size and pulmonary vascularity. Atherosclerotic calcification aorta. Small hiatal hernia again seen. Minimal peribronchial thickening without infiltrate, pleural effusion or pneumothorax. Bones demineralized. IMPRESSION: Small hiatal hernia. Minimal bronchitic changes without infiltrate. Electronically Signed   By: Ulyses Southward M.D.   On: 11/09/2015 17:27    ECG & Cardiac Imaging    Cath 11/10/2015  Conclusion       Ost LAD lesion, 40% stenosed.     Ost LM lesion, 30% stenosed.     Prox Cx lesion, 20% stenosed.     Mid LAD to Dist LAD lesion, 10% stenosed.     There is moderate to severe left ventricular systolic dysfunction.  Moderately severe LV dysfunction with severe hypo-to akinesis involving the entire anterior anterolateral wall extending to the apex with an ejection fraction of 30%.  Smooth ostial narrowing of the left main 30%; eccentric ostial 40% LAD stenosis seen on only selected views. Smooth mild 10% mid LAD narrowing; normal small ramus immediate vessel; tortuous left circumflex: Artery with mild 20% proximal narrowing; and widely patent ostial and mid RCA stents.  Normal fractional flow reserve involving flow via the left main and ostial LAD.   Echo 11/11/2015  LV EF: 65% - 70%  ------------------------------------------------------------------- Indications: MI - acute 410.91. Murmur 785.2.  ------------------------------------------------------------------- History: PMH: Coronary artery disease. PMH: Myocardial infarction. Risk factors: Former tobacco  use. Dyslipidemia.  ------------------------------------------------------------------- Study Conclusions  - Left ventricle: The cavity size was normal. Wall thickness was  normal. Systolic function was vigorous. The estimated ejection  fraction was in the range of 65% to 70%. Doppler parameters are  consistent with abnormal left ventricular relaxation (grade 1  diastolic dysfunction). - Aortic valve: There was mild to moderate regurgitation. - Mitral valve: Calcified annulus. Mildly thickened leaflets .   Assessment & Plan    [phone] with CAD s/p STEMI (01/2015, RCA PCI) with post MI angina, depression, anxiety, and recent hospitalization for Takotsubo's who presents with CP. There are several typical features of the chest pain. Her ECG demonstrates new anterior TWI. It is unclear if these symptoms are related to an ACS event, anxiety, symptoms from resolving Tabotsubos, or something else entirely. Repolarization abnormalities can certainly evolve after Takotsubos. Of note, the patient reported that she was admitted several times after her STEMI due to recurrent CP. Given the uncertainty of the diagnosis, will plan to admit for rule out and serial ECGs.   - ranexa500mg  BID - IMdur 30mg  daily - lisinopril 2.5mg  daily - atorvastatin 80mg  - Effient 10mg  - ASA 81mg  - serial ECGs - Cycle troponins  Signed, Glori Luis, MD  11/19/2015, 3:36 AM

## 2015-11-19 NOTE — Progress Notes (Signed)
Patient Name: Laura Herrera Date of Encounter: 11/19/2015  Active Problems:   Chest pain   Primary Cardiologist: Dr Herbie Baltimore  Patient Profile: CAD s/p STEMI (01/2015, RCA PCI) with post MI angina, depression, anxiety, and recent hospitalization for Takotsubo's (EF now normal) who presented 04/14 with CP.   SUBJECTIVE: No more chest pain, has eaten and been up to BR without chest pressure or arm pain (presenting sx).  OBJECTIVE Filed Vitals:   11/18/15 2300 11/19/15 0130 11/19/15 0230 11/19/15 0544  BP: 140/57 106/56 134/55 138/55  Pulse: 61 57 58 62  Temp:    98.1 F (36.7 C)  TempSrc:    Oral  Resp: 19   18  Height:     (1.702 m)  Weight:    168 lb 8 oz (76.431 kg)  SpO2: 100% 98% 99% 100%   No intake or output data in the 24 hours ending 11/19/15 1005 Filed Weights   11/18/15 2246 11/19/15 0544  Weight: 169 lb (76.658 kg) 168 lb 8 oz (76.431 kg)    PHYSICAL EXAM General: Well developed, well nourished, female in no acute distress. Head: Normocephalic, atraumatic.  Neck: Supple without bruits, JVD not elevated. Lungs:  Resp regular and unlabored, few rales, clears w/ deep inspiration. Heart: RRR, S1, S2, no S3, S4, soft murmur; no rub. Abdomen: Soft, non-tender, non-distended, BS + x 4.  Extremities: No clubbing, cyanosis, edema.  Neuro: Alert and oriented X 3. Moves all extremities spontaneously. Psych: Normal affect.  LABS: CBC: Recent Labs  11/18/15 2256  WBC 7.5  HGB 10.4*  HCT 32.7*  MCV 86.7  PLT 348   INR: Recent Labs  11/18/15 2256  INR 0.97   Basic Metabolic Panel: Recent Labs  11/18/15 2256  NA 138  K 4.3  CL 107  CO2 19*  GLUCOSE 104*  BUN 22*  CREATININE 1.22*  CALCIUM 9.8   Cardiac Enzymes: Recent Labs  11/19/15 0552 11/19/15 0816  TROPONINI <0.03 <0.03    Recent Labs  11/19/15 0018 11/19/15 0208  TROPIPOC 0.01 0.00   TELE:  SR       Radiology/Studies: Dg Chest 2 View  11/19/2015  CLINICAL DATA:  Mid  chest pressure, onset today. Left upper extremity paresthesias. EXAM: CHEST  2 VIEW COMPARISON:  11/09/2015 FINDINGS: The heart size and mediastinal contours are within normal limits. Both lungs are clear. The visualized skeletal structures are unremarkable. IMPRESSION: No active cardiopulmonary disease. Electronically Signed   By: Ellery Plunk M.D.   On: 11/19/2015 00:24     Current Medications:  . atorvastatin  80 mg Oral q1800  . cholecalciferol  1,000 Units Oral Daily  . isosorbide mononitrate  30 mg Oral Daily  . lisinopril  2.5 mg Oral Daily  . metoprolol succinate  12.5 mg Oral Daily  .  morphine injection  4 mg Intravenous Once  . pantoprazole  40 mg Oral Daily  . [START ON 11/20/2015] prasugrel  10 mg Oral Daily  . ranolazine  500 mg Oral BID      ASSESSMENT AND PLAN: Active Problems:   Chest pain - hx STEMI 01/2015 w/ PCI RCA; 2 subsequent admits for CP - hx Takotsubo, dc 04/07, peak troponin 0.76 - ez have remained negative this admit - sx have resolved.  - do not see a need for cath or MV - MD advise on d/c home and f/u as OP. - she is on Imdur 30 (added last admit), Toprol XL 12.5, lisinopril  2.5 mg, Ranexa 500 mg bid, Effient - ASA stopped due to ecchymosis - possible spasm, but pt w/ orthostatic dizziness at times so hard to increase meds - will d/c lisinopril, f/u in office and if dizziness is improved, try increasing Imdur or adding Norvasc.   Signed, Theodore Demark , PA-C 10:05 AM 11/19/2015  I have seen and examined the patient along with Barrett, Bjorn Loser , PA-C.  I have reviewed the chart, notes and new data. I agree with PA's note.  Key new complaints: no further chest symptoms Key examination changes: 2/6 systolic murmur, no diastolic murmur along the R and L parasternal area Key new findings / data: normal troponin x 4 assays, improving anterior T wave changes compared to ECGs from last week.  PLAN: The history of similar postinfarction angina  last year raises the possibility that she has true coronary vasospasm, rather than takotsubo sd. Low diastolic BP limits use of conventional antianginals. Ranexa of lesser value if this truly is vasospasm. Prefer vasodilators. EF has rebounded to normal, stop lisinopril. Titrate up nitrates, consider adding low dose amlodipine if BP allows  Thurmon Fair, MD, [facility] [phone] 11/19/2015, 12:43 PM

## 2015-11-19 NOTE — Discharge Summary (Signed)
Discharge Summary    Patient ID: Laura Herrera,  MRN: [id number], DOB/AGE: [birth year] 65 y.o.  Admit date: 11/18/2015 Discharge date: 11/19/2015  Primary Care Provider: Darrow Bussing Primary Cardiologist: Dr Herbie Baltimore  Discharge Diagnoses    Principal Problem:   Chest pain with high risk for cardiac etiology   Allergies Allergies  Allergen Reactions  . 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  . Other Other (See Comments)    Sensitive to the electrodes  . Shellfish-Derived Products Other (See Comments)    Crabs, lobster, crayfish  . Vancomycin     REACTION: severe rash/hives  . Epinephrine Palpitations    Palpitations and involuntary body jerking    Diagnostic Studies/Procedures    2V CXR _____________   History of Present Illness     65 yo female w/ hx STEMI (01/2015, RCA PCI) with post MI angina (?spasm), depression, anxiety, and recent hospitalization for Takotsubo's (peak troponin 0.76), EF normal at cath. She presented 04/14 with CP and was admitted.  Hospital Course     Consultants: none  Her cardiac enzymes were negative for MI. Her ECG was consistent with her ECG from 04/05, but very different from her presenting ECG 04/04. Her chest pain and arm numbness resolved and did not return. She was not SOB and her CXR showed no abnormalities. Other labs showed a mild anemia, improved from her previous d/c labs. Her BUN/Cr were up slightly from the last admission, and she described orthostatic dizziness/presyncope. She is encouraged to stay hydrated and is not on salt restrictions.   On 04/14, she was seen by Dr Royann Shivers and all data were reviewed. She was ambulating without chest pain or SOB. It is possible that her 2 admissions after her MI in 2016, the presumed Takotsubo MI (but without a precipitating event) and the chest pain this admission were all secondary to spasm.   Her  EF had been 30% at cath, improved to normal the day after her MI in 2016. With BP issues, we will d/c the lisinopril. Hopefully, without the lisinopril, she will tolerate the Imdur 30 mg and the Toprol XL 12.5 mg daily. If she is doing well, we can increase the Imdur or add low-dose Norvasc as an outpatient. Orthostatic dizziness may limit options.   No further inpatient workup is indicated and she is considered stable for discharge, to follow up as an outpatient. _____________  Discharge Vitals Blood pressure 138/55, pulse 62, temperature 98.1 F (36.7 C), temperature source Oral, resp. rate 18, height 5\' 7"  (1.702 m), weight 168 lb 8 oz (76.431 kg), SpO2 100 %.  Filed Weights   11/18/15 2246 11/19/15 0544  Weight: 169 lb (76.658 kg) 168 lb 8 oz (76.431 kg)    Labs & Radiologic Studies    CBC  Recent Labs  11/18/15 2256  WBC 7.5  HGB 10.4*  HCT 32.7*  MCV 86.7  PLT 348   Basic Metabolic Panel  Recent Labs  11/18/15 2256  NA 138  K 4.3  CL 107  CO2 19*  GLUCOSE 104*  BUN 22*  CREATININE 1.22*  CALCIUM 9.8   Cardiac Enzymes  Recent Labs  11/19/15 0552 11/19/15 0816  TROPONINI <0.03 <0.03  _____________  Dg Chest 2 View 11/19/2015  CLINICAL DATA:  Mid chest pressure, onset today. Left upper extremity paresthesias. EXAM: CHEST  2 VIEW COMPARISON:  11/09/2015 FINDINGS: The heart size and mediastinal contours are within normal  limits. Both lungs are clear. The visualized skeletal structures are unremarkable. IMPRESSION: No active cardiopulmonary disease. Electronically Signed   By: Ellery Plunk M.D.   On: 11/19/2015 00:24   Disposition   Pt is being discharged home today in good condition.  Follow-up Plans & Appointments    Follow-up Information    Follow up with Janetta Hora, PA-C On 11/25/2015.   Specialties:  Cardiology, Radiology   Why:  Please note the address. Your appointment is at the [facility]. Please arrive at 1:15 pm for a 1:30  pm appointment.   Contact information:   [address] [place] [address] [phone]      Discharge Instructions       Diet general    Complete by:  As directed      Increase activity slowly    Complete by:  As directed               Discharge Medications   Current Discharge Medication List    CONTINUE these medications which have NOT CHANGED   Details  acetaminophen (TYLENOL) 500 MG tablet Take 500 mg by mouth every 6 (six) hours as needed for headache.    atorvastatin (LIPITOR) 80 MG tablet TAKE 1 TABLET BY MOUTH EVERY DAY Qty: 30 tablet, Refills: 6    cholecalciferol (VITAMIN D) 1000 UNITS tablet Take 1,000 Units by mouth daily.    clobetasol cream (TEMOVATE) 0.05 % Apply 1 application topically 2 (two) times daily.    EFFIENT 10 MG TABS tablet Take 10 mg by mouth daily. Refills: 3    furosemide (LASIX) 20 MG tablet Take 0.5 tablets (10 mg total) by mouth as needed. Qty: 30 tablet, Refills: 6    isosorbide mononitrate (IMDUR) 30 MG 24 hr tablet Take 1 tablet (30 mg total) by mouth daily. Qty: 90 tablet, Refills: 3    metoprolol succinate (TOPROL-XL) 25 MG 24 hr tablet Take 0.5 tablets (12.5 mg total) by mouth daily. Qty: 45 tablet, Refills: 3    nitroGLYCERIN (NITROSTAT) 0.4 MG SL tablet Place 1 tablet (0.4 mg total) under the tongue every 5 (five) minutes x 3 doses as needed for chest pain. Qty: 25 tablet, Refills: 3    pantoprazole (PROTONIX) 40 MG tablet TAKE 1 TABLET BY MOUTH DAILY AT 12 NOON. Qty: 30 tablet, Refills: 6    ranolazine (RANEXA) 500 MG 12 hr tablet Take 1 tablet (500 mg total) by mouth 2 (two) times daily. KEEP OV. Qty: 60 tablet, Refills: 1      STOP taking these medications     lisinopril (PRINIVIL,ZESTRIL) 5 MG tablet           Outstanding Labs/Studies   None  Duration of Discharge Encounter   Greater than 30 minutes including physician time.  Melida Quitter NP 11/19/2015, 12:09 PM

## 2015-11-22 ENCOUNTER — Encounter (HOSPITAL_COMMUNITY): Payer: Self-pay

## 2015-11-24 ENCOUNTER — Encounter (HOSPITAL_COMMUNITY): Payer: Self-pay

## 2015-11-24 NOTE — Progress Notes (Signed)
Cardiology Office Note    Date:  11/25/2015   ID:  Laura Herrera, DOB March 17, 1951, MRN [id number]  PCP:  Darrow Bussing, MD  Cardiologist:  Dr. Willow Ora hospital follow up  History of Present Illness:  Laura Herrera is a 65 y.o. female with a history of CAD w/ STEMI s/p PCI to RCA (01/2015) with post MI angina (? Spasm), depression/anxiety, and recent admission for Takostubos CM who presents to clinic for post hospital follow up.   Seen by Dr Herbie Baltimore 09/14/2015, post-MI angina was being treated with Ranexa, Toprol XL 25 mg, statin. Taken off ASA due to bruising, still on Effient. Taken off Imdur.  She was admitted 4/4-11/12/15. She initially presented with chest pain and ruled in for NSTEMI. Patient underwent cardiac catheterization on 11/10/2015 which showed Ost LAD lesion, 40% stenosed, Ost LM lesion, 30% stenosed,Prox Cx lesion, 20% stenosed, Mid LAD to Dist LAD lesion, 10% stenosed. RCA stent patent. Her EF was 30% with wall motion abnormality concerning for Takotsubo cardiomyopathy, previous echocardiogram in 2016 shows normal EF. Interestingly, echocardiogram was repeated on 11/11/2015 and showed normal EF 65-70%, grade 1 diastolic dysfunction, mild-to-moderate AR.   She was then readmitted overnight on 11/18/15 for recurrent chest pain. Her chest pain was concerning for coronary vasospasm. She ruled out for MI. She did have some mild orthostasis and lisinopril was discontinued. Dr. Royann Shivers saw her during this admission and stated "The history of similar postinfarction angina last year raises the possibility that she has true coronary vasospasm, rather than takotsubo sd. Low diastolic BP limits use of conventional antianginals. Ranexa of lesser value if this truly is vasospasm. Prefer vasodilators. EF has rebounded to normal, stop lisinopril. Titrate up nitrates, consider adding low dose amlodipine if BP allows." If she is doing well, plan was to increase the Imdur or add low-dose  Norvasc as an outpatient. Orthostatic dizziness may limit options.   Today she presents to clinic for follow-up. She has not had any more chest pain. She does get some SOB when she bends over. No LE edema, orthopnea or PND. No dizziness or syncope. The dizziness has improved a lot off lisinopril. She has not picked up the Ranexa because now it is $180 and was told in the hospital but this might not even work for her if she indeed has coronary spasm. She is very concerned because even with her ST elevation MI, her symptoms were very vague. We discussed coming off the Ranexa to see how she does since it is so expensive. She is willing to chart coming off the Ranexa.     Past Medical History  Diagnosis Date  . Psoriasis     hand and foot (11/09/2015)  . MRSA infection     h/o MRSA infection in the right foot  . Anxiety   . Depression   . Insomnia   . Coronary artery disease     a. 01/2015 Inf STEMI/PCI: RCA 80ost (2.75x12 Promus Premier DES), 100p (2.5x24 Promus Premier DES), 70p/m (2.5x24 Promus Premier DES), 90d (PTCA);  b. 01/2015 Relook Cath: LM nl, LAD 84m, D1 min irregs, LCX nl- L->R collats,  OM1 mod dzs, OM2 mild dizs, RCA 10ost, heavy thrombus p/m, 10d->Aggrastat;  c. 01/2015 Echo: EF 60-65%, no rwma, mod AI, PASP .  Marland Kitchen Hypertension   . Dyslipidemia   . Kidney stones     "years ago; passed it"  . Complication of anesthesia     pseudo cholinesterace deficiency  . PONV (  postoperative nausea and vomiting)   . Pseudocholinesterase deficiency   . Myocardial infarction (HCC) 01/2015  . NSTEMI (non-ST elevated myocardial infarction) (HCC) 11/09/2015  . Headache     "used to have them daily; I no longer do" (11/09/2015)  . Chest pain with high risk for cardiac etiology 11/19/2015    Past Surgical History  Procedure Laterality Date  . Angioplasty  01/12/2015    Procedure: Angioplasty;  Surgeon: Lyn Records, MD;  Location: [facility] INVASIVE CV LAB;  Service: Cardiovascular;;  . Appendectomy    .  Dilation and curettage of uterus  1979    S/P miscarriage  . Cardiac catheterization N/A 01/11/2015    Procedure: Left Heart Cath and Coronary Angiography;  Surgeon: Marykay Lex, MD;  Location: [facility] INVASIVE CV LAB;  Service: Cardiovascular;  Laterality: N/A;  . Cardiac catheterization  01/11/2015    Procedure: Coronary Stent Intervention;  Surgeon: Marykay Lex, MD;  Location: [facility] INVASIVE CV LAB;  Service: Cardiovascular;;  . Cardiac catheterization  01/11/2015    Procedure: Coronary Balloon Angioplasty;  Surgeon: Marykay Lex, MD;  Location: [facility] INVASIVE CV LAB;  Service: Cardiovascular;;  . Cardiac catheterization N/A 01/12/2015    Procedure: Left Heart Cath and Coronary Angiography;  Surgeon: Lyn Records, MD;  Location: [facility] INVASIVE CV LAB;  Service: Cardiovascular;  Laterality: N/A;  . Nose surgery  X 2    "to remove genetic bone spur"  . Incision and drainage foot Right     "spider bite"  . Cardiac catheterization N/A 11/10/2015    Procedure: Left Heart Cath and Coronary Angiography;  Surgeon: Lennette Bihari, MD;  Location: [facility] INVASIVE CV LAB;  Service: Cardiovascular;  Laterality: N/A;  . Cardiac catheterization N/A 11/10/2015    Procedure: Intravascular Pressure Wire/FFR Study;  Surgeon: Lennette Bihari, MD;  Location: [facility] INVASIVE CV LAB;  Service: Cardiovascular;  Laterality: N/A;    Current Medications: Outpatient Prescriptions Prior to Visit  Medication Sig Dispense Refill  . acetaminophen (TYLENOL) 500 MG tablet Take 500 mg by mouth every 6 (six) hours as needed for headache.    . cholecalciferol (VITAMIN D) 1000 UNITS tablet Take 1,000 Units by mouth daily.    . clobetasol cream (TEMOVATE) 0.05 % Apply 1 application topically 2 (two) times daily.    Marland Kitchen EFFIENT 10 MG TABS tablet Take 10 mg by mouth daily.  3  . metoprolol succinate (TOPROL-XL) 25 MG 24 hr tablet Take 0.5 tablets (12.5 mg total) by mouth daily. 45 tablet 3  . nitroGLYCERIN (NITROSTAT) 0.4 MG SL tablet Place 1 tablet  (0.4 mg total) under the tongue every 5 (five) minutes x 3 doses as needed for chest pain. 25 tablet 3  . pantoprazole (PROTONIX) 40 MG tablet TAKE 1 TABLET BY MOUTH DAILY AT 12 NOON. 30 tablet 6  . atorvastatin (LIPITOR) 80 MG tablet TAKE 1 TABLET BY MOUTH EVERY DAY 30 tablet 6  . furosemide (LASIX) 20 MG tablet Take 0.5 tablets (10 mg total) by mouth as needed. (Patient taking differently: Take 20 mg by mouth daily as needed for fluid. ) 30 tablet 6  . isosorbide mononitrate (IMDUR) 30 MG 24 hr tablet Take 1 tablet (30 mg total) by mouth daily. 90 tablet 3  . ranolazine (RANEXA) 500 MG 12 hr tablet Take 1 tablet (500 mg total) by mouth 2 (two) times daily. KEEP OV. 60 tablet 1   No facility-administered medications prior to visit.     Allergies:   Cholinesterase  inhibitors; Other; Shellfish-derived products; Vancomycin; and Epinephrine   Social History   Social History  . Marital Status: Widowed    Spouse Name: N/A  . Number of Children: N/A  . Years of Education: N/A   Occupational History  . unemployed    Social History Main Topics  . Smoking status: Former Smoker -- 0.10 packs/day for 2 years    Types: Cigarettes  . Smokeless tobacco: Never Used     Comment: "quit smoking in the 1980s"  . Alcohol Use: 1.2 oz/week    2 Glasses of wine per week  . Drug Use: No  . Sexual Activity: Yes    Birth Control/ Protection: Post-menopausal   Other Topics Concern  . None   Social History Narrative   She is under a significant amount of stress socially.    Her husband committed suicide several years ago - she continues to have difficulty with coping.     Family History:  The patient's family history includes Alzheimer's disease in her father; Heart Problems in her sister; Heart disease in her maternal grandmother; Hyperlipidemia in her brother and mother; Hypotension in her sister; Mental illness in her sister; Peripheral vascular disease in her mother; Schizophrenia in her sister.    ROS:   Please see the history of present illness.    ROS All other systems reviewed and are negative.   PHYSICAL EXAM:   VS:  BP 138/50 mmHg  Pulse 68  Ht 5\' 7"  (1.702 m)  Wt 167 lb 12.8 oz (76.114 kg)  BMI 26.28 kg/m2   GEN: Well nourished, well developed, in no acute distress HEENT: normal Neck: no JVD, carotid bruits, or masses Cardiac: RRR; 2/6 murmurs, rubs, or gallops,no edema  Respiratory:  clear to auscultation bilaterally, normal work of breathing GI: soft, nontender, nondistended, + BS MS: no deformity or atrophy Skin: warm and dry, no rash Neuro:  Alert and Oriented x 3, Strength and sensation are intact Psych: euthymic mood, full affect  Wt Readings from Last 3 Encounters:  11/25/15 167 lb 12.8 oz (76.114 kg)  11/19/15 168 lb 8 oz (76.431 kg)  11/12/15 177 lb 14.6 oz (80.7 kg)      Studies/Labs Reviewed:   EKG:  EKG is not ordered today.    Recent Labs: 01/11/2015: Magnesium 1.7; TSH 3.035 01/20/2015: B Natriuretic Peptide 173.4* 02/05/2015: ALT 17; Pro B Natriuretic peptide (BNP) 121.0* 11/18/2015: BUN 22*; Creatinine, Ser 1.22*; Hemoglobin 10.4*; Platelets 348; Potassium 4.3; Sodium 138   Lipid Panel    Component Value Date/Time   CHOL 149 11/10/2015 0520   TRIG 98 11/10/2015 0520   HDL 73 11/10/2015 0520   CHOLHDL 2.0 11/10/2015 0520   VLDL 20 11/10/2015 0520   LDLCALC 56 11/10/2015 0520    Additional studies/ records that were reviewed today include:  Cath 11/10/2015 Conclusion     Ost LAD lesion, 40% stenosed.  Ost LM lesion, 30% stenosed.  Prox Cx lesion, 20% stenosed.  Mid LAD to Dist LAD lesion, 10% stenosed.  There is moderate to severe left ventricular systolic dysfunction.  Moderately severe LV dysfunction with severe hypo-to akinesis involving the entire anterior anterolateral wall extending to the apex with an ejection fraction of 30%.  Smooth ostial narrowing of the left main 30%; eccentric ostial 40% LAD stenosis seen on  only selected views. Smooth mild 10% mid LAD narrowing; normal small ramus immediate vessel; tortuous left circumflex: Artery with mild 20% proximal narrowing; and widely patent ostial and mid RCA stents.  Normal fractional flow reserve involving flow via the left main and ostial LAD.    Echo 11/11/2015  LV EF: 65% - 70% Study Conclusions - Left ventricle: The cavity size was normal. Wall thickness was  normal. Systolic function was vigorous. The estimated ejection  fraction was in the range of 65% to 70%. Doppler parameters are  consistent with abnormal left ventricular relaxation (grade 1  diastolic dysfunction). - Aortic valve: There was mild to moderate regurgitation. - Mitral valve: Calcified annulus. Mildly thickened leaflets .         ASSESSMENT:    1. Chest pain, unspecified chest pain type   2. CAD S/P PCI of the ostial and mid/distal RCA   3. Takotsubo cardiomyopathy   4. HLD (hyperlipidemia)   5. Aortic regurgitation   6. Other fatigue   7. Pre-diabetes      PLAN:  In order of problems listed above:  Chest pain: now resolved. Will try to increase her imdur from 30 to 60 mg daily. Per Dr. Royann Shivers, Ranexa would not be beneficial if she has coronary vasospasm. Since the prescription is so expensive, we will trial her off of this with close follow-up.   CAD w/ STEMI s/p DES to RCA: continue only Effient. She stopped ASA due to bruising. Continue BB and statin.   Takostubos CM vs coronary vasospasm: EF 30% by heart cath. EF normalized by 2D ECHO the day after heart catheterization. Per Dr. Royann Shivers, she may have had spasm all along. Will try to uptitrate vasodilators in the case that there is spasm. Will increase imdur from 30mg  to 60mg  daily. We will stop Ranexa  HLD: lipids well controlled on statin. LDL 56 on most 11/10/15. She thinks the statin has caused worsening memory issues. Wants to cut back on lipitor. We will decrease her dose from 80mg  to 40mg  daily  and see if this helps.   Aortic regurgitation: follow clinically. Repeat 2D ECHO in 11/2016.  Fatigue: will check a TSH   Pre-diabetes: counseled on diet and exercise  Medication Adjustments/Labs and Tests Ordered: Current medicines are reviewed at length with the patient today.  Concerns regarding medicines are outlined above.  Medication changes, Labs and Tests ordered today are listed in the Patient Instructions below. Patient Instructions  Medication Instructions:  Your physician has recommended you make the following change in your medication:  1.  STOP the Ranexa 2.  INCREASE the Imdur to 60 mg taking 1 tablet daily 3.  DECREASE the Lipitor to 40 mg taking 1 tablet daily  Pt was given 30 day supply of Ranexa 1000 mg taking 1/2 tablet twice a day  Labwork: TODAY:  TSH  Testing/Procedures: None ordered  Follow-Up: Your physician recommends that you schedule a follow-up appointment in:  2 WEEKS WITH KATIE THOMPSON, PA-C (12/09/15 FLEX IS FINE)  Any Other Special Instructions Will Be Listed Below (If Applicable).   If you need a refill on your cardiac medications before your next appointment, please call your pharmacy.       Charlestine Massed  11/25/2015 2:21 PM    [facility] [address] [place], [place], [place]  [address] Phone: [phone]; Fax: [phone]

## 2015-11-25 ENCOUNTER — Ambulatory Visit (INDEPENDENT_AMBULATORY_CARE_PROVIDER_SITE_OTHER): Payer: BLUE CROSS/BLUE SHIELD | Admitting: Physician Assistant

## 2015-11-25 ENCOUNTER — Encounter: Payer: Self-pay | Admitting: Physician Assistant

## 2015-11-25 VITALS — BP 138/50 | HR 68 | Ht 67.0 in | Wt 167.8 lb

## 2015-11-25 DIAGNOSIS — I5181 Takotsubo syndrome: Secondary | ICD-10-CM

## 2015-11-25 DIAGNOSIS — E785 Hyperlipidemia, unspecified: Secondary | ICD-10-CM | POA: Diagnosis not present

## 2015-11-25 DIAGNOSIS — R079 Chest pain, unspecified: Secondary | ICD-10-CM | POA: Diagnosis not present

## 2015-11-25 DIAGNOSIS — I351 Nonrheumatic aortic (valve) insufficiency: Secondary | ICD-10-CM

## 2015-11-25 DIAGNOSIS — I2510 Atherosclerotic heart disease of native coronary artery without angina pectoris: Secondary | ICD-10-CM | POA: Diagnosis not present

## 2015-11-25 DIAGNOSIS — R5383 Other fatigue: Secondary | ICD-10-CM

## 2015-11-25 DIAGNOSIS — Z9861 Coronary angioplasty status: Secondary | ICD-10-CM

## 2015-11-25 DIAGNOSIS — R7303 Prediabetes: Secondary | ICD-10-CM

## 2015-11-25 LAB — TSH: TSH: 2.67 mIU/L

## 2015-11-25 MED ORDER — ATORVASTATIN CALCIUM 40 MG PO TABS: 80.0000 mg | ORAL_TABLET | Freq: Every day | ORAL | Status: DC

## 2015-11-25 MED ORDER — ISOSORBIDE MONONITRATE ER 60 MG PO TB24: 30.0000 mg | ORAL_TABLET | Freq: Every day | ORAL | Status: DC

## 2015-11-25 NOTE — Patient Instructions (Addendum)
Medication Instructions:  Your physician has recommended you make the following change in your medication:  1.  STOP the Ranexa 2.  INCREASE the Imdur to 60 mg taking 1 tablet daily 3.  DECREASE the Lipitor to 40 mg taking 1 tablet daily  Pt was given 30 day supply of Ranexa 1000 mg taking 1/2 tablet twice a day  Labwork: TODAY:  TSH  Testing/Procedures: None ordered  Follow-Up: Your physician recommends that you schedule a follow-up appointment in:  2 WEEKS WITH KATIE THOMPSON, PA-C (12/09/15 FLEX IS FINE)  Any Other Special Instructions Will Be Listed Below (If Applicable).   If you need a refill on your cardiac medications before your next appointment, please call your pharmacy.

## 2015-11-26 ENCOUNTER — Encounter (HOSPITAL_COMMUNITY): Payer: Self-pay

## 2015-11-29 ENCOUNTER — Encounter (HOSPITAL_COMMUNITY): Payer: Self-pay

## 2015-12-01 ENCOUNTER — Encounter (HOSPITAL_COMMUNITY): Payer: Self-pay

## 2015-12-02 ENCOUNTER — Telehealth: Payer: Self-pay | Admitting: Cardiology

## 2015-12-02 NOTE — Telephone Encounter (Signed)
Follow Up ° °Pt returned call//  °

## 2015-12-02 NOTE — Telephone Encounter (Signed)
Per [name] I tried to call this patient and there was no answer. I had given her ranexa 1000mg  BID and told her she could split it. Apparently, you cant. She was going to try off the ranexa so i dont even know is she is taking it, but if she is i want to make sure she dosent split it. Thanks! She could just try the 1000 mg BID if she feels she needs it vs the 500mg  BID for now. Hopefully, she is not having chest pain and doesn't have to take at all. Just wondered if you could try to get a hold of her. Thanks Jen~Thompson, PA-C,   I called the pt and she stated that she actually has not taken the medication, but she did get some just in case.  She has been advised that if she does try it, not to cut them in 1/2.Marland Kitchen Pt verbalized understanding.

## 2015-12-02 NOTE — Telephone Encounter (Signed)
Spoke to patient She states someone called her today and left a message to call back, which she is doing now  patient thinks it was a person name jennifer  Informed patient will route -   patient request to leave detail message on voicemail if unable to talk to her.she has bad reception at times

## 2015-12-03 ENCOUNTER — Encounter (HOSPITAL_COMMUNITY): Payer: Self-pay

## 2015-12-06 ENCOUNTER — Encounter (HOSPITAL_COMMUNITY): Payer: Self-pay

## 2015-12-08 ENCOUNTER — Encounter (HOSPITAL_COMMUNITY): Payer: Self-pay

## 2015-12-09 NOTE — Progress Notes (Signed)
Cardiology Office Note    Date:  12/15/2015   ID:  Laura Herrera, DOB [birth year], MRN [id number]  PCP:  Darrow Bussing, MD  Cardiologist:  Dr. Herbie Baltimore   Follow up  History of Present Illness:  Laura Herrera is a 65 y.o. female with a history of CAD w/ STEMI s/p PCI to RCA (01/2015) with post MI angina (? Spasm), depression/anxiety, and recent admission for Takostubos CM who presents to clinic for follow up.   Seen by Dr Herbie Baltimore 09/14/2015, post-MI angina was being treated with Ranexa, Toprol XL 25 mg, statin. Taken off ASA due to bruising, still on Effient. Taken off Imdur.  She was admitted 4/4-11/12/15. She initially presented with chest pain and ruled in for NSTEMI. Patient underwent cardiac catheterization on 11/10/2015 which showed Ost LAD lesion, 40% stenosed, Ost LM lesion, 30% stenosed, Prox Cx lesion, 20% stenosed, Mid LAD to Dist LAD lesion, 10% stenosed. RCA stent patent. Her EF was 30% with wall motion abnormality concerning for Takotsubo cardiomyopathy, previous echocardiogram in 2016 showed normal EF. Interestingly, echocardiogram was repeated on 11/11/2015 and showed normal EF 65-70%, grade 1 diastolic dysfunction, mild-to-moderate AR.   She was then readmitted overnight on 11/18/15 for recurrent chest pain. Her chest pain was concerning for coronary vasospasm. She ruled out for MI. She did have some mild orthostasis and lisinopril was discontinued. Dr. Royann Shivers saw her during this admission and stated "The history of similar postinfarction angina last year raises the possibility that she has true coronary vasospasm, rather than takotsubo sd. Low diastolic BP limits use of conventional antianginals. Ranexa of lesser value if this truly is vasospasm. Prefer vasodilators. EF has rebounded to normal, stop lisinopril. Titrate up nitrates, consider adding low dose amlodipine if BP allows." If she is doing well, plan was to increase the Imdur or add low-dose Norvasc as an outpatient.  Orthostatic dizziness may limit options.   I saw her in clinic on 11/25/15 for post hospital follow up. She had not had any more chest pain. She had not picked up the Ranexa because now it is $180 and was told in the hospital but this might not even work for her if she indeed has coronary spasm. She was very concerned because even with her ST elevation MI, her symptoms were very vague. We discussed coming off the Ranexa to see how she does since it is so expensive. I increased imdur from 30mg  to 60mg  daily. Also, she felt like Lipitor had caused her significant memory issues. Her LDL was 56 on most recent lipid panel so we decreased lipitor from 80mg  to 40mg  daily.  Today she presents to clinic for follow up. She does have some SOB with exertion. No chest pain. Her dizziness much improved off the Ranexa. She doesn't feel like decreasing the Lipitor has helped her memory, wants to know her lipid numbers. She sometimes get LE edema and has to prop up on pillows. She takes PRN lasix. No dizziness or syncope. No blood in her stool or urine.    Past Medical History  Diagnosis Date  . Psoriasis     hand and foot (11/09/2015)  . MRSA infection     h/o MRSA infection in the right foot  . Anxiety   . Depression   . Insomnia   . Coronary artery disease     a. 01/2015 Inf STEMI/PCI: RCA 80ost (2.75x12 Promus Premier DES), 100p (2.5x24 Promus Premier DES), 70p/m (2.5x24 Promus Premier DES), 90d (PTCA);  b. 01/2015 Relook  Cath: LM nl, LAD 39m, D1 min irregs, LCX nl- L->R collats,  OM1 mod dzs, OM2 mild dizs, RCA 10ost, heavy thrombus p/m, 10d->Aggrastat;  c. 01/2015 Echo: EF 60-65%, no rwma, mod AI, PASP .  Marland Kitchen Hypertension   . Dyslipidemia   . Kidney stones     "years ago; passed it"  . Complication of anesthesia     pseudo cholinesterace deficiency  . PONV (postoperative nausea and vomiting)   . Pseudocholinesterase deficiency   . Myocardial infarction (HCC) 01/2015  . NSTEMI (non-ST elevated myocardial  infarction) (HCC) 11/09/2015  . Headache     "used to have them daily; I no longer do" (11/09/2015)  . Chest pain with high risk for cardiac etiology 11/19/2015    Past Surgical History  Procedure Laterality Date  . Angioplasty  01/12/2015    Procedure: Angioplasty;  Surgeon: Lyn Records, MD;  Location: [facility] INVASIVE CV LAB;  Service: Cardiovascular;;  . Appendectomy    . Dilation and curettage of uterus  1979    S/P miscarriage  . Cardiac catheterization N/A 01/11/2015    Procedure: Left Heart Cath and Coronary Angiography;  Surgeon: Marykay Lex, MD;  Location: [facility] INVASIVE CV LAB;  Service: Cardiovascular;  Laterality: N/A;  . Cardiac catheterization  01/11/2015    Procedure: Coronary Stent Intervention;  Surgeon: Marykay Lex, MD;  Location: [facility] INVASIVE CV LAB;  Service: Cardiovascular;;  . Cardiac catheterization  01/11/2015    Procedure: Coronary Balloon Angioplasty;  Surgeon: Marykay Lex, MD;  Location: [facility] INVASIVE CV LAB;  Service: Cardiovascular;;  . Cardiac catheterization N/A 01/12/2015    Procedure: Left Heart Cath and Coronary Angiography;  Surgeon: Lyn Records, MD;  Location: [facility] INVASIVE CV LAB;  Service: Cardiovascular;  Laterality: N/A;  . Nose surgery  X 2    "to remove genetic bone spur"  . Incision and drainage foot Right     "spider bite"  . Cardiac catheterization N/A 11/10/2015    Procedure: Left Heart Cath and Coronary Angiography;  Surgeon: Lennette Bihari, MD;  Location: [facility] INVASIVE CV LAB;  Service: Cardiovascular;  Laterality: N/A;  . Cardiac catheterization N/A 11/10/2015    Procedure: Intravascular Pressure Wire/FFR Study;  Surgeon: Lennette Bihari, MD;  Location: [facility] INVASIVE CV LAB;  Service: Cardiovascular;  Laterality: N/A;    Current Medications: Outpatient Prescriptions Prior to Visit  Medication Sig Dispense Refill  . acetaminophen (TYLENOL) 500 MG tablet Take 500 mg by mouth every 6 (six) hours as needed for headache.    . ALPRAZolam (XANAX) 0.25 MG tablet  TAKE 1 TABLET TWICE A DAY AS NEEDED FOR ANXIETY  0  . cholecalciferol (VITAMIN D) 1000 UNITS tablet Take 1,000 Units by mouth daily.    . clobetasol cream (TEMOVATE) 0.05 % Apply 1 application topically 2 (two) times daily.    Marland Kitchen EFFIENT 10 MG TABS tablet Take 10 mg by mouth daily.  3  . furosemide (LASIX) 20 MG tablet Take 20 mg by mouth daily as needed for fluid.    . metoprolol succinate (TOPROL-XL) 25 MG 24 hr tablet Take 0.5 tablets (12.5 mg total) by mouth daily. 45 tablet 3  . nitroGLYCERIN (NITROSTAT) 0.4 MG SL tablet Place 1 tablet (0.4 mg total) under the tongue every 5 (five) minutes x 3 doses as needed for chest pain. 25 tablet 3  . pantoprazole (PROTONIX) 40 MG tablet TAKE 1 TABLET BY MOUTH DAILY AT 12 NOON. 30 tablet 6  . isosorbide mononitrate (IMDUR)  60 MG 24 hr tablet Take 0.5 tablets (30 mg total) by mouth daily. 90 tablet 3  . atorvastatin (LIPITOR) 40 MG tablet Take 2 tablets (80 mg total) by mouth daily. (Patient not taking: Reported on 12/15/2015) 90 tablet 3   No facility-administered medications prior to visit.     Allergies:   Cholinesterase inhibitors; Other; Shellfish-derived products; Vancomycin; and Epinephrine   Social History   Social History  . Marital Status: Widowed    Spouse Name: N/A  . Number of Children: N/A  . Years of Education: N/A   Occupational History  . unemployed    Social History Main Topics  . Smoking status: Former Smoker -- 0.10 packs/day for 2 years    Types: Cigarettes  . Smokeless tobacco: Never Used     Comment: "quit smoking in the 1980s"  . Alcohol Use: 1.2 oz/week    2 Glasses of wine per week  . Drug Use: No  . Sexual Activity: Yes    Birth Control/ Protection: Post-menopausal   Other Topics Concern  . None   Social History Narrative   She is under a significant amount of stress socially.    Her husband committed suicide several years ago - she continues to have difficulty with coping.     Family History:  The  patient's family history includes Alzheimer's disease in her father; Heart Problems in her sister; Heart disease in her maternal grandmother; Hyperlipidemia in her brother and mother; Hypotension in her sister; Mental illness in her sister; Peripheral vascular disease in her mother; Schizophrenia in her sister. There is no history of Heart attack.   ROS:   Please see the history of present illness.    ROS All other systems reviewed and are negative.   PHYSICAL EXAM:   VS:  BP 140/70 mmHg  Pulse 80  Ht  (1.702 m)  Wt 171 lb 3.2 oz (77.656 kg)  BMI 26.81 kg/m2   GEN: Well nourished, well developed, in no acute distress HEENT: normal Neck: no JVD, carotid bruits, or masses Cardiac: RRR; 2/6 murmurs, rubs, or gallops,no edema  Respiratory:  clear to auscultation bilaterally, normal work of breathing GI: soft, nontender, nondistended, + BS MS: no deformity or atrophy Skin: warm and dry, no rash Neuro:  Alert and Oriented x 3, Strength and sensation are intact Psych: euthymic mood, full affect  Wt Readings from Last 3 Encounters:  12/15/15 171 lb 3.2 oz (77.656 kg)  11/25/15 167 lb 12.8 oz (76.114 kg)  11/19/15 168 lb 8 oz (76.431 kg)      Studies/Labs Reviewed:   EKG:  EKG is not ordered today.    Recent Labs: 01/11/2015: Magnesium 1.7 01/20/2015: B Natriuretic Peptide 173.4* 02/05/2015: ALT 17; Pro B Natriuretic peptide (BNP) 121.0* 11/18/2015: BUN 22*; Creatinine, Ser 1.22*; Hemoglobin 10.4*; Platelets 348; Potassium 4.3; Sodium 138 11/25/2015: TSH 2.67   Lipid Panel    Component Value Date/Time   CHOL 149 11/10/2015 0520   TRIG 98 11/10/2015 0520   HDL 73 11/10/2015 0520   CHOLHDL 2.0 11/10/2015 0520   VLDL 20 11/10/2015 0520   LDLCALC 56 11/10/2015 0520    Additional studies/ records that were reviewed today include:  Cath 11/10/2015 Conclusion     Ost LAD lesion, 40% stenosed.  Ost LM lesion, 30% stenosed.  Prox Cx lesion, 20% stenosed.  Mid LAD to Dist  LAD lesion, 10% stenosed.  There is moderate to severe left ventricular systolic dysfunction.  Moderately severe LV dysfunction with  severe hypo-to akinesis involving the entire anterior anterolateral wall extending to the apex with an ejection fraction of 30%.  Smooth ostial narrowing of the left main 30%; eccentric ostial 40% LAD stenosis seen on only selected views. Smooth mild 10% mid LAD narrowing; normal small ramus immediate vessel; tortuous left circumflex: Artery with mild 20% proximal narrowing; and widely patent ostial and mid RCA stents.  Normal fractional flow reserve involving flow via the left main and ostial LAD.    Echo 11/11/2015  LV EF: 65% - 70% Study Conclusions - Left ventricle: The cavity size was normal. Wall thickness was  normal. Systolic function was vigorous. The estimated ejection  fraction was in the range of 65% to 70%. Doppler parameters are  consistent with abnormal left ventricular relaxation (grade 1  diastolic dysfunction). - Aortic valve: There was mild to moderate regurgitation. - Mitral valve: Calcified annulus. Mildly thickened leaflets .         ASSESSMENT:    1. Chest pain, unspecified chest pain type   2. CAD S/P PCI of the ostial and mid/distal RCA   3. Takotsubo cardiomyopathy   4. HLD (hyperlipidemia)   5. Aortic regurgitation   6. Pre-diabetes   7. SOB (shortness of breath)      PLAN:  In order of problems listed above:  Chest pain: now resolved on increased imdur 60mg  daily. She has not had to use the Ranexa at all.   CAD w/ STEMI s/p DES to RCA: continue only Effient. She stopped ASA due to bruising. Continue BB and statin.   Takostubos CM vs coronary vasospasm: EF 30% by heart cath. EF normalized by 2D ECHO the day after heart catheterization. Per Dr. Royann Shivers, she may have had spasm all along. Recently uptitrated vasodilators in the case that there is spasm.   HLD: LDL 56 on 11/10/15 on atorvastatin 80mg  daily.  She felt like the statin had caused worsening memory issues so last appointment we went down from 80mg  to 40mg . Will check lipids 6 weeks after dose change and if >70 will have her go back up to 80mg  because the dose decrease has not helped her memory improve significantly.  Aortic regurgitation: follow clinically. Repeat 2D ECHO in 11/2016.  Pre-diabetes: counseled on diet and exercise  SOB: she does gets some LE edema and has some mild orthopnea. Does not appear volume overlodad. Will check a BMET and BNP. If BNP elevated I will increase her last to everyday.    Medication Adjustments/Labs and Tests Ordered: Current medicines are reviewed at length with the patient today.  Concerns regarding medicines are outlined above.  Medication changes, Labs and Tests ordered today are listed in the Patient Instructions below. Patient Instructions  Medication Instructions:  Your physician recommends that you continue on your current medications as directed. Please refer to the Current Medication list given to you today. 1.  We called in a new prescription for the Atorvastatin 40 mg and Imdur 60 mg   Labwork: TODAY:  BMP & BNP 01/21/2016:  FASTING LIPID PANEL @ [facility]  Testing/Procedures: None ordered  Follow-Up: Your physician recommends that you schedule a follow-up appointment in: KEEP YOUR FOLLOW-UP APPOINTMENT WITH DR. HARDING AS PLANNED   Any Other Special Instructions Will Be Listed Below (If Applicable).   If you need a refill on your cardiac medications before your next appointment, please call your pharmacy.       Billy Fischer, PA-C  12/15/2015 2:24 PM    [facility] [address], [place], [place]  [address] Phone: [phone]; Fax: [phone]

## 2015-12-10 ENCOUNTER — Encounter (HOSPITAL_COMMUNITY): Payer: Self-pay

## 2015-12-13 ENCOUNTER — Encounter (HOSPITAL_COMMUNITY): Payer: Self-pay

## 2015-12-14 ENCOUNTER — Ambulatory Visit
Admission: RE | Admit: 2015-12-14 | Discharge: 2015-12-14 | Disposition: A | Discharge status: Home / Self Care | Payer: BLUE CROSS/BLUE SHIELD | Source: Ambulatory Visit

## 2015-12-14 DIAGNOSIS — Z1231 Encounter for screening mammogram for malignant neoplasm of breast: Secondary | ICD-10-CM

## 2015-12-15 ENCOUNTER — Encounter: Payer: Self-pay | Admitting: Physician Assistant

## 2015-12-15 ENCOUNTER — Encounter (HOSPITAL_COMMUNITY): Payer: Self-pay

## 2015-12-15 ENCOUNTER — Ambulatory Visit (INDEPENDENT_AMBULATORY_CARE_PROVIDER_SITE_OTHER): Payer: BLUE CROSS/BLUE SHIELD | Admitting: Physician Assistant

## 2015-12-15 VITALS — BP 140/70 | HR 80 | Ht 67.0 in | Wt 171.2 lb

## 2015-12-15 DIAGNOSIS — I351 Nonrheumatic aortic (valve) insufficiency: Secondary | ICD-10-CM

## 2015-12-15 DIAGNOSIS — I2510 Atherosclerotic heart disease of native coronary artery without angina pectoris: Secondary | ICD-10-CM | POA: Diagnosis not present

## 2015-12-15 DIAGNOSIS — Z9861 Coronary angioplasty status: Secondary | ICD-10-CM

## 2015-12-15 DIAGNOSIS — R079 Chest pain, unspecified: Secondary | ICD-10-CM | POA: Diagnosis not present

## 2015-12-15 DIAGNOSIS — R7303 Prediabetes: Secondary | ICD-10-CM

## 2015-12-15 DIAGNOSIS — I5181 Takotsubo syndrome: Secondary | ICD-10-CM | POA: Diagnosis not present

## 2015-12-15 DIAGNOSIS — E785 Hyperlipidemia, unspecified: Secondary | ICD-10-CM

## 2015-12-15 DIAGNOSIS — R0602 Shortness of breath: Secondary | ICD-10-CM

## 2015-12-15 LAB — BASIC METABOLIC PANEL
BUN: 14 mg/dL (ref 7–25)
CALCIUM: 9.7 mg/dL (ref 8.6–10.4)
CO2: 23 mmol/L (ref 20–31)
CREATININE: 0.96 mg/dL (ref 0.50–0.99)
Chloride: 108 mmol/L (ref 98–110)
Glucose, Bld: 102 mg/dL — ABNORMAL HIGH (ref 65–99)
Potassium: 4.3 mmol/L (ref 3.5–5.3)
SODIUM: 140 mmol/L (ref 135–146)

## 2015-12-15 MED ORDER — ISOSORBIDE MONONITRATE ER 60 MG PO TB24: 60.0000 mg | ORAL_TABLET | Freq: Every day | ORAL | Status: DC

## 2015-12-15 MED ORDER — ATORVASTATIN CALCIUM 40 MG PO TABS: 40.0000 mg | ORAL_TABLET | Freq: Every day | ORAL | Status: DC

## 2015-12-15 NOTE — Patient Instructions (Addendum)
Medication Instructions:  Your physician recommends that you continue on your current medications as directed. Please refer to the Current Medication list given to you today. 1.  We called in a new prescription for the Atorvastatin 40 mg and Imdur 60 mg   Labwork: TODAY:  BMP & BNP 01/21/2016:  FASTING LIPID PANEL @ [facility]  Testing/Procedures: None ordered  Follow-Up: Your physician recommends that you schedule a follow-up appointment in: KEEP YOUR FOLLOW-UP APPOINTMENT WITH DR. HARDING AS PLANNED   Any Other Special Instructions Will Be Listed Below (If Applicable).   If you need a refill on your cardiac medications before your next appointment, please call your pharmacy.

## 2015-12-16 ENCOUNTER — Telehealth (HOSPITAL_COMMUNITY): Payer: Self-pay | Admitting: *Deleted

## 2015-12-16 ENCOUNTER — Telehealth: Payer: Self-pay | Admitting: Cardiology

## 2015-12-16 ENCOUNTER — Ambulatory Visit: Payer: BLUE CROSS/BLUE SHIELD | Admitting: Physician Assistant

## 2015-12-16 LAB — BRAIN NATRIURETIC PEPTIDE: BRAIN NATRIURETIC PEPTIDE: 183.7 pg/mL — AB (ref <100)

## 2015-12-16 NOTE — Telephone Encounter (Signed)
Follow-up      The pt. Is calling back to speak to [employer]. The pt missed the phone cause she was on the other line with another person.

## 2015-12-16 NOTE — Telephone Encounter (Signed)
-----   Message from Janetta Hora, PA-C sent at 12/16/2015  1:59 PM EDT ----- That's fine. Thank you maria ----- Message -----    From: Cammy Copa, RN    Sent: 12/16/2015   1:48 PM      To: Janetta Hora, PA-C  Hi Katie  I talked to Monte Fantasia today. [name] would like to re enroll in the non monitored maintenance program if that is okay.  Please Advise  [name] did the monitored phase 2 program last year.  Thanks for your input!  Byrd Hesselbach       ----- Message -----    From: Janetta Hora, PA-C    Sent: 12/15/2015   2:07 PM      To: Cammy Copa, RN  Ms. Petry is okay to resume cardiac rehab. Thanks maria!

## 2015-12-16 NOTE — Telephone Encounter (Signed)
Spoke to patient. BNP Result given.  Patient states she has lasix available and she took a tablet this morning.  Verbalized understanding

## 2015-12-16 NOTE — Telephone Encounter (Signed)
SPOKE TO PATIENT NO NEW INFORMATION NEEDED.  PATIENT AWARE IT IS OKAY TO START MAINTENANCE PROGRAM

## 2015-12-16 NOTE — Telephone Encounter (Signed)
Follow Up   Pt request a call back. She states that she may have missed a call for the results.

## 2015-12-17 ENCOUNTER — Encounter (HOSPITAL_COMMUNITY): Payer: Self-pay

## 2015-12-20 ENCOUNTER — Encounter (HOSPITAL_COMMUNITY): Payer: Self-pay

## 2015-12-22 ENCOUNTER — Encounter (HOSPITAL_COMMUNITY): Payer: Self-pay

## 2015-12-24 ENCOUNTER — Encounter (HOSPITAL_COMMUNITY): Payer: Self-pay

## 2015-12-27 ENCOUNTER — Encounter (HOSPITAL_COMMUNITY): Payer: Self-pay

## 2015-12-29 ENCOUNTER — Encounter (HOSPITAL_COMMUNITY): Payer: Self-pay

## 2015-12-31 ENCOUNTER — Encounter (HOSPITAL_COMMUNITY): Payer: Self-pay

## 2016-01-05 ENCOUNTER — Encounter (HOSPITAL_COMMUNITY): Payer: Self-pay

## 2016-01-07 ENCOUNTER — Encounter (HOSPITAL_COMMUNITY): Payer: Self-pay

## 2016-01-10 ENCOUNTER — Encounter (HOSPITAL_COMMUNITY): Payer: Self-pay

## 2016-01-12 ENCOUNTER — Encounter (HOSPITAL_COMMUNITY): Payer: Self-pay

## 2016-01-14 ENCOUNTER — Encounter (HOSPITAL_COMMUNITY): Payer: Self-pay

## 2016-01-17 ENCOUNTER — Encounter (HOSPITAL_COMMUNITY): Payer: Self-pay

## 2016-01-19 ENCOUNTER — Encounter (HOSPITAL_COMMUNITY): Payer: Self-pay

## 2016-01-21 ENCOUNTER — Encounter (HOSPITAL_COMMUNITY): Payer: Self-pay

## 2016-01-24 ENCOUNTER — Encounter (HOSPITAL_COMMUNITY): Payer: Self-pay

## 2016-01-25 ENCOUNTER — Encounter: Payer: Self-pay | Admitting: Cardiology

## 2016-01-25 ENCOUNTER — Ambulatory Visit (INDEPENDENT_AMBULATORY_CARE_PROVIDER_SITE_OTHER): Payer: BLUE CROSS/BLUE SHIELD | Admitting: Cardiology

## 2016-01-25 VITALS — BP 150/71 | HR 67 | Ht 67.0 in | Wt 173.0 lb

## 2016-01-25 DIAGNOSIS — I2510 Atherosclerotic heart disease of native coronary artery without angina pectoris: Secondary | ICD-10-CM

## 2016-01-25 DIAGNOSIS — I10 Essential (primary) hypertension: Secondary | ICD-10-CM | POA: Diagnosis not present

## 2016-01-25 DIAGNOSIS — R0609 Other forms of dyspnea: Secondary | ICD-10-CM

## 2016-01-25 DIAGNOSIS — E785 Hyperlipidemia, unspecified: Secondary | ICD-10-CM | POA: Diagnosis not present

## 2016-01-25 DIAGNOSIS — I25111 Atherosclerotic heart disease of native coronary artery with angina pectoris with documented spasm: Secondary | ICD-10-CM | POA: Diagnosis not present

## 2016-01-25 DIAGNOSIS — Z9861 Coronary angioplasty status: Secondary | ICD-10-CM

## 2016-01-25 MED ORDER — CLOPIDOGREL BISULFATE 75 MG PO TABS: 75.0000 mg | ORAL_TABLET | Freq: Every day | ORAL | Status: DC

## 2016-01-25 MED ORDER — FUROSEMIDE 20 MG PO TABS: 20.0000 mg | ORAL_TABLET | ORAL | Status: DC

## 2016-01-25 NOTE — Progress Notes (Signed)
PCP: Darrow Bussing, MD  Clinic Note: Chief Complaint  Patient presents with  . Hospitalization Follow-up    sob; when bending over. edema; feet and ankles.  . Coronary Artery Disease    Recent catheterization. Consideration for possible spasm    HPI: Laura Herrera is a 65 y.o. female with a PMH below who presents today for ~ 1 month f/u.  Inferior STEMI 01/11/2015 -> severe ostial RCA mid RCA disease 100% occlusion in the mid vessel. Significant thrombus with distal embolization -->   2 Promus Premier DES stents: 2.25 mm x 24 mm covering proximal and mid lesions with 2.75 mm x 12 mm stent in the ostial proximal segment overlapping. PTCA of distal RCA. -->   Relook cath with patent stents and PTCA site heavy thrombus noted.  Myoview 03/08/2015: LOW risk. Medium sized mild intensity partially reversible defect in the basal inferior, basal inferolateral and mid inferolateral wall suggestive of subendocardial MI in the inferior wall. No ischemia. EF greater than 65%.  Mild MI in 4/2-17 - cath with patent stents & Neg LAD FFR. Had Anterior HK (TakoTsubo pattern - thought to be coronary spasm)  MATTY DEAMER was last seen In clinic on Dec 15, 2015 - by Cline Crock, PA (I last saw her in February 2017). She was first seen by Ms. Janee Morn on April 20 and was doing relatively well. But she had not picked up Ranexa. Partially because financially she isn't then partially because of concern for possible spasm, Ranexa was discontinued. They increase Imdur to 60 mg. Lipitor was post be reduced to 40 mg due to concern for possible memory issues with atorvastatin. Follow-up visit notice some exertional dyspnea but no chest pain. Dizziness improved off of Ranexa. Did not notice any improvement in memory issues with reduced Lipitor. Did notice some edema. Was only taking when necessary Lasix.   Recent Hospitalizations: 11/2015 - Initially admitted April 4-7 with anginal symptoms/non-STEMI  --> underwent cardiac catheterization showing possible Takotsubo but patent stents. Follow-up echo x-ray showed improved EF. - Readmitted on April 13 for recurrent pain, this time thought to be related to coronary spasm and not Takotsubo.  This would suggest that the anterior wall motion abnormality on previous cath was related to LAD spasm. -- Per discharging cardiologist: "Ranexa of lesser value if this truly is vasospasm. Prefer vasodilators. EF has rebounded to normal, stop lisinopril. Titrate up nitrates, consider adding low dose amlodipine if BP allows."   Studies Reviewed:  Procedures 11/16/15   Intravascular Pressure Wire/FFR Study   Left Heart Cath and Coronary Angiography    Conclusion     Ost LAD lesion, 40% stenosed.  Ost LM lesion, 30% stenosed.  Prox Cx lesion, 20% stenosed.  Mid LAD to Dist LAD lesion, 10% stenosed.  There is moderate to severe left ventricular systolic dysfunction.  Moderately severe LV dysfunction with severe hypo-to akinesis involving the entire anterior anterolateral wall extending to the apex with an ejection fraction of 30%.  Smooth ostial narrowing of the left main 30%; eccentric ostial 40% LAD stenosis seen on only selected views. Smooth mild 10% mid LAD narrowing; normal small ramus immediate vessel; tortuous left circumflex: Artery with mild 20% proximal narrowing; and widely patent ostial and mid RCA stents.  Normal fractional flow reserve involving flow via the left main and ostial LAD.    Echo 11/11/15: EF 65-70% - Left ventricle: The cavity size was normal. Wall thickness was normal. Systolic function was vigorous. The estimated ejection  fraction  was in the range of 65% to 70%. Doppler parameters are consistent with abnormal left ventricular relaxation (grade 1  diastolic dysfunction). - Aortic valve: There was mild to moderate regurgitation. - Mitral valve: Calcified annulus. Mildly thickened leaflets .  Interval History:  Laura Herrera presents today for post hospital follow-up relatively well. She is getting better on still has some exertional dyspnea, but is trying to get back into activity. She had 2 episodes of chest discomfort that she took aspirin and nitroglycerin for that were related to family stress. It was nonexertional. She was not doing anything besides trying to deal with her brother staying with her while going through divorce. She also was quite stressed out about Father's Day thinking about her father and husband's passing. She does note a lid of exertional shortness breath and walk around a little bit of swelling. It has improved, but the swelling is somewhat she hadn't noticed before. It doesn't really associate with PND or orthopnea. She has dyspnea with bending over  No palpitations, lightheadedness, dizziness, weakness or syncope/near syncope. No TIA/amaurosis fugax symptoms. No melena, hematochezia, hematuria, or epstaxis. No claudication.  ROS: A comprehensive was performed. Review of Systems  Constitutional: Negative for malaise/fatigue (Energy* Better).  HENT: Negative for nosebleeds.   Eyes: Negative for blurred vision.  Respiratory: Negative for cough, shortness of breath and wheezing.   Cardiovascular:       Per history of present illness  Gastrointestinal: Negative for blood in stool and melena.  Genitourinary: Negative for hematuria.  Musculoskeletal: Positive for back pain and joint pain. Negative for falls.  Neurological: Negative for seizures, loss of consciousness and headaches.  Endo/Heme/Allergies: Bruises/bleeds easily (Quite bad bruising with minimal touch).  Psychiatric/Behavioral: Positive for memory loss (Seems to be stable). Negative for depression. The patient is nervous/anxious (She is under a lot of social stress).   All other systems reviewed and are negative.   Past Medical History  Diagnosis Date  . Psoriasis     hand and foot (11/09/2015)  . MRSA infection      h/o MRSA infection in the right foot  . Anxiety   . Depression   . Insomnia   . Coronary artery disease     a. 01/2015 Inf STEMI/PCI: RCA 80ost (2.75x12 Promus Premier DES), 100p (2.5x24 Promus Premier DES), 70p/m (2.5x24 Promus Premier DES), 90d (PTCA);  b. 01/2015 Relook Cath: LM nl, LAD 29m, D1 min irregs, LCX nl- L->R collats,  OM1 mod dzs, OM2 mild dizs, RCA 10ost, heavy thrombus p/m, 10d->Aggrastat;  c. 01/2015 Echo: EF 60-65%, no rwma, mod AI, PASP .  Marland Kitchen Hypertension   . Dyslipidemia   . Kidney stones     "years ago; passed it"  . Complication of anesthesia     pseudo cholinesterace deficiency  . PONV (postoperative nausea and vomiting)   . Pseudocholinesterase deficiency   . Myocardial infarction (HCC) 01/2015  . NSTEMI (non-ST elevated myocardial infarction) (HCC) 11/09/2015  . Headache     "used to have them daily; I no longer do" (11/09/2015)  . Chest pain with high risk for cardiac etiology 11/19/2015    Cardiac catheterization showed stable coronaries with patent RCA stents. FFR of LAD negative. --> LV gram EF suggested Takotsubo versus LAD spasm.    Past Surgical History  Procedure Laterality Date  . Angioplasty  01/12/2015    Procedure: Angioplasty;  Surgeon: Lyn Records, MD;  Location: [facility] INVASIVE CV LAB;  Service: Cardiovascular;;  . Appendectomy    . Dilation  and curettage of uterus  1979    S/P miscarriage  . Cardiac catheterization N/A 01/11/2015    Procedure: Left Heart Cath and Coronary Angiography;  Surgeon: Marykay Lex, MD;  Location: [facility] INVASIVE CV LAB;  Service: Cardiovascular;  Laterality: N/A;  . Cardiac catheterization  01/11/2015    Procedure: Coronary Stent Intervention;  Surgeon: Marykay Lex, MD;  Location: [facility] INVASIVE CV LAB;  Service: Cardiovascular;;  . Cardiac catheterization  01/11/2015    Procedure: Coronary Balloon Angioplasty;  Surgeon: Marykay Lex, MD;  Location: [facility] INVASIVE CV LAB;  Service: Cardiovascular;;  . Cardiac catheterization N/A  01/12/2015    Procedure: Left Heart Cath and Coronary Angiography;  Surgeon: Lyn Records, MD;  Location: [facility] INVASIVE CV LAB;  Service: Cardiovascular;  Laterality: N/A;  . Nose surgery  X 2    "to remove genetic bone spur"  . Incision and drainage foot Right     "spider bite"  . Cardiac catheterization N/A 11/10/2015    Procedure: Left Heart Cath and Coronary Angiography;  Surgeon: Lennette Bihari, MD;  Location: [facility] INVASIVE CV LAB;  Service: Cardiovascular; patent RCA stents. Ostial LM 30%, ostial LAD 40% - FFR negative. Moderate-severe LV dysfunction with hypo-kinesis involving the anterior wall to the apex. EF suggested 30%.   . Cardiac catheterization N/A 11/10/2015    Procedure: Intravascular Pressure Wire/FFR Study;  Surgeon: Lennette Bihari, MD;  Location: [facility] INVASIVE CV LAB;  Service: Cardiovascular;  FFR of LAD negative.  . Transthoracic echocardiogram  11/11/2015    Normal LV size and function. Vigorous function with EF 6570%. GR 1 DD. Mild-moderate MR.    Prior to Admission medications   Medication Sig Start Date Taking? Authorizing Provider  acetaminophen (TYLENOL) 500 MG tablet Take 500 mg by mouth every 6 (six) hours as needed for headache.  Yes Historical Provider, MD  ALPRAZolam (XANAX) 0.25 MG tablet TAKE 1 TABLET TWICE A DAY AS NEEDED FOR ANXIETY 11/12/15 Yes Historical Provider, MD  atorvastatin (LIPITOR) 40 MG tablet Take 1 tablet (40 mg total) by mouth daily. 12/15/15 Yes Janetta Hora, PA-C  cholecalciferol (VITAMIN D) 1000 UNITS tablet Take 1,000 Units by mouth daily.  Yes Historical Provider, MD  clobetasol cream (TEMOVATE) 0.05 % Apply 1 application topically 2 (two) times daily.  Yes Historical Provider, MD  EFFIENT 10 MG TABS tablet Take 10 mg by mouth daily. 05/22/15 Yes Historical Provider, MD  furosemide (LASIX) 20 MG tablet Take 20 mg by mouth daily as needed for fluid.  Yes Historical Provider, MD  isosorbide mononitrate (IMDUR) 60 MG 24 hr tablet Take 1 tablet (60  mg total) by mouth daily. 12/15/15 Yes Janetta Hora, PA-C  metoprolol succinate (TOPROL-XL) 25 MG 24 hr tablet Take 0.5 tablets (12.5 mg total) by mouth daily. 09/14/15 Yes Marykay Lex, MD  nitroGLYCERIN (NITROSTAT) 0.4 MG SL tablet Place 1 tablet (0.4 mg total) under the tongue every 5 (five) minutes x 3 doses as needed for chest pain. 01/17/15 Yes Ok Anis, NP  pantoprazole (PROTONIX) 40 MG tablet TAKE 1 TABLET BY MOUTH DAILY AT 12 NOON. 03/15/15 Yes Marykay Lex, MD    Allergies  Allergen Reactions  . 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  . Other Other (See Comments)    Sensitive to the electrodes  . Shellfish-Derived Products Other (See Comments)    Crabs, lobster, crayfish  . Vancomycin  REACTION: severe rash/hives  . Epinephrine Palpitations    Palpitations and involuntary body jerking     Social History   Social History  . Marital Status: Widowed    Spouse Name: N/A  . Number of Children: N/A  . Years of Education: N/A   Occupational History  . unemployed    Social History Main Topics  . Smoking status: Former Smoker -- 0.10 packs/day for 2 years    Types: Cigarettes  . Smokeless tobacco: Never Used     Comment: "quit smoking in the 1980s"  . Alcohol Use: 1.2 oz/week    2 Glasses of wine per week  . Drug Use: No  . Sexual Activity: Yes    Birth Control/ Protection: Post-menopausal   Other Topics Concern  . None   Social History Narrative   She is under a significant amount of stress socially.    Her husband committed suicide several years ago - she continues to have difficulty with coping.    Family History  Problem Relation Age of Onset  . Peripheral vascular disease Mother   . Hyperlipidemia Mother   . Alzheimer's disease Father   . Mental illness Sister   . Heart Problems Sister   . Schizophrenia Sister   . Hypotension Sister   .  Hyperlipidemia Brother   . Heart disease Maternal Grandmother   . Heart attack Neg Hx     Wt Readings from Last 3 Encounters:  01/25/16 173 lb (78.472 kg)  12/15/15 171 lb 3.2 oz (77.656 kg)  11/25/15 167 lb 12.8 oz (76.114 kg)    PHYSICAL EXAM BP 150/71 mmHg  Pulse 67  Ht 5\' 7"  (1.702 m)  Wt 173 lb (78.472 kg)  BMI 27.09 kg/m2 General appearance: alert, cooperative, appears stated age, no distress and Relatively healthy appearing. Notably less anxious Neck: no adenopathy, no carotid bruit, no JVD and supple, symmetrical, trachea midline Lungs: CTA B, normal percussion bilaterally and Nonlabored, good air movement Heart: Bradycardic but regular rhythm, S1, S2 normal, no murmur, click, rub or gallop and normal apical impulse Abdomen: soft, non-tender; bowel sounds normal; no masses, no organomegaly Extremities: No clubbing or cyanosis. Mild trace to 1+ puffy edema in the ankles bilaterally. Pulses: 2+ and symmetric Skin: Skin color, texture, turgor normal. No rashes or lesions Neurologic: Alert and oriented X 3, normal strength and tone. Normal symmetric reflexes. Normal coordination and gait She has significant violaceous purpuric type bruising at the sites on her upper chest were her hands were up against her chest while sleeping   Adult ECG Report Not checked  Other studies Reviewed: Additional studies/ records that were reviewed today include:  Recent Labs:   Lab Results  Component Value Date   CHOL 149 11/10/2015   HDL 73 11/10/2015   LDLCALC 56 11/10/2015   TRIG 98 11/10/2015   CHOLHDL 2.0 11/10/2015     ASSESSMENT / PLAN: Problem List Items Addressed This Visit    Hyperlipidemia with target LDL less than 70 (Chronic)    Most recent cholesterol check a great. The plan was for her to reduce her atorvastatin 40 mg. We'll recheck lipids last see her back in follow-up and determine whether or not we can reduce it further. She did not mention much better memory issues  at this point. Reassess at follow-up, and recheck labs      Relevant Medications   furosemide (LASIX) 20 MG tablet   Exertional dyspnea    She does have exertional dyspnea which is  a little bit concerning since her EF is improved. My suspicion is that she may have some residual diastolic dysfunction from her MRI and this most recent event. With her having some mild edema, and there may be some benefit in having her take some of the Lasix is a standing dose. I recommended that she takes it 3 days a week Monday Wednesday Friday and then on the additional days when necessary.      Essential hypertension (Chronic)    Blood pressure is elevated today. We had tried in the past to titrate up her beta blocker, but she did not tolerate. Her blood pressures probably high enough to either restart ACE inhibitor or consider amlodipine. I just wanted at least give some time for her to recover. We can readdress in close follow-up.      Relevant Medications   furosemide (LASIX) 20 MG tablet   Coronary artery disease with angina pectoris with documented spasm (HCC) (Chronic)    It makes sense that her chest pain episode with positive troponins and anterior wall motion abnormality with a mild lesion in the LAD could have been consistent with LAD spasm since he recovered so quickly she could indeed have Takotsubo, but coronary spasm seemed more likely to the inpatient hospital team. Doing better on increase Imdur dose, however she has had to use it least 2 nitroglycerin tabs. If this increases, would probably titrate up Imdur and consider adding amlodipine.      Relevant Medications   furosemide (LASIX) 20 MG tablet   CAD S/P PCI of the ostial and mid/distal RCA - Primary (Chronic)    Very difficult PCI with ostial lesion. Thankfully, on relook catheterization, the ostial and mid RCA stents looked patent. Only mild disease elsewhere. FFR of the LAD to evaluate the combination of left main and LAD lesion was  negative. She is currently on Effient, with extensive bruising. I think we can switch her to Plavix now that she is one year out. Especially with the stents being open. She will see the complete her current prescription of Effient and then switch to Plavix. (First 2 days take 2 pills) On very low dose of metoprolol, which I am reluctant to increase due to fatigue. ACE inhibitor was actually stopped in the hospital in order to allow for titration of nitrates are minus amlodipine      Relevant Medications   furosemide (LASIX) 20 MG tablet      Current medicines are reviewed at length with the patient today. (+/- concerns) Asked about bruising  The following changes have been made:  CHANGE LASIX TO 3 TIMEAS A WEEK ( TRY MON, WD, FRI)  CONTACT us OR CARDIAC REHAB WHEN YOU ARE READY TO RETURN.  CAN COMPLETE EFFIENT AND THE START PLAVIX ONE TABLET DAILY- NEW PRESCRIPTION CALLED INT THE PHARMACY.  IF CHEST DISCOMFORT INCREASE IN FREQUENCY , CALL OFFICE WE MAY ADD ANOTHER MEDICATIONS  Your physician recommends that you schedule a follow-up appointment in: OCT 2017 WITH DR Briseyda Fehr.     Studies Ordered:   No orders of the defined types were placed in this encounter.      [name], M.D., M.S. Interventional Cardiologist   Pager # [phone] Phone # [phone] [address] [place], [place] [address]

## 2016-01-25 NOTE — Patient Instructions (Addendum)
CHANGE LASIX TO 3 TIMEAS A WEEK ( TRY MON, WD, FRI)  CONTACT us OR CARDIAC REHAB WHEN YOU ARE READY TO RETURN.  CAN COMPLETE EFFIENT AND THE START PLAVIX ONE TABLET DAILY- NEW PRESCRIPTION CALLED INT THE PHARMACY.  IF CHEST DISCOMFORT INCREASE IN FREQUENCY , CALL OFFICE WE MAY ADD ANOTHER MEDICATIONS  Your physician recommends that you schedule a follow-up appointment in: OCT 2017 WITH DR HARDING.  If you need a refill on your cardiac medications before your next appointment, please call your pharmacy.

## 2016-01-26 ENCOUNTER — Other Ambulatory Visit: Payer: BLUE CROSS/BLUE SHIELD

## 2016-01-26 ENCOUNTER — Encounter (HOSPITAL_COMMUNITY): Payer: Self-pay

## 2016-01-26 ENCOUNTER — Other Ambulatory Visit: Payer: Self-pay | Admitting: Nurse Practitioner

## 2016-01-26 ENCOUNTER — Other Ambulatory Visit: Payer: Self-pay | Admitting: Cardiology

## 2016-01-26 MED ORDER — PRASUGREL HCL 10 MG PO TABS: 10.0000 mg | ORAL_TABLET | Freq: Every day | ORAL | Status: DC

## 2016-01-27 ENCOUNTER — Encounter: Payer: Self-pay | Admitting: Cardiology

## 2016-01-27 DIAGNOSIS — R0609 Other forms of dyspnea: Secondary | ICD-10-CM | POA: Insufficient documentation

## 2016-01-27 NOTE — Assessment & Plan Note (Signed)
She does have exertional dyspnea which is a little bit concerning since her EF is improved. My suspicion is that she may have some residual diastolic dysfunction from her MRI and this most recent event. With her having some mild edema, and there may be some benefit in having her take some of the Lasix is a standing dose. I recommended that she takes it 3 days a week Monday Wednesday Friday and then on the additional days when necessary.

## 2016-01-27 NOTE — Assessment & Plan Note (Signed)
It makes sense that her chest pain episode with positive troponins and anterior wall motion abnormality with a mild lesion in the LAD could have been consistent with LAD spasm since he recovered so quickly she could indeed have Takotsubo, but coronary spasm seemed more likely to the inpatient hospital team. Doing better on increase Imdur dose, however she has had to use it least 2 nitroglycerin tabs. If this increases, would probably titrate up Imdur and consider adding amlodipine.

## 2016-01-27 NOTE — Assessment & Plan Note (Signed)
Very difficult PCI with ostial lesion. Thankfully, on relook catheterization, the ostial and mid RCA stents looked patent. Only mild disease elsewhere. FFR of the LAD to evaluate the combination of left main and LAD lesion was negative. She is currently on Effient, with extensive bruising. I think we can switch her to Plavix now that she is one year out. Especially with the stents being open. She will see the complete her current prescription of Effient and then switch to Plavix. (First 2 days take 2 pills) On very low dose of metoprolol, which I am reluctant to increase due to fatigue. ACE inhibitor was actually stopped in the hospital in order to allow for titration of nitrates are minus amlodipine

## 2016-01-27 NOTE — Assessment & Plan Note (Signed)
Most recent cholesterol check a great. The plan was for her to reduce her atorvastatin 40 mg. We'll recheck lipids last see her back in follow-up and determine whether or not we can reduce it further. She did not mention much better memory issues at this point. Reassess at follow-up, and recheck labs

## 2016-01-27 NOTE — Assessment & Plan Note (Signed)
Blood pressure is elevated today. We had tried in the past to titrate up her beta blocker, but she did not tolerate. Her blood pressures probably high enough to either restart ACE inhibitor or consider amlodipine. I just wanted at least give some time for her to recover. We can readdress in close follow-up.

## 2016-01-28 ENCOUNTER — Encounter (HOSPITAL_COMMUNITY): Payer: Self-pay

## 2016-01-31 ENCOUNTER — Encounter (HOSPITAL_COMMUNITY): Payer: Self-pay

## 2016-02-02 ENCOUNTER — Encounter (HOSPITAL_COMMUNITY): Payer: Self-pay

## 2016-02-04 ENCOUNTER — Encounter (HOSPITAL_COMMUNITY): Payer: Self-pay

## 2016-02-09 ENCOUNTER — Other Ambulatory Visit: Payer: Self-pay | Admitting: Physician Assistant

## 2016-02-09 DIAGNOSIS — I6523 Occlusion and stenosis of bilateral carotid arteries: Secondary | ICD-10-CM

## 2016-02-12 ENCOUNTER — Other Ambulatory Visit: Payer: Self-pay | Admitting: Cardiology

## 2016-02-14 ENCOUNTER — Other Ambulatory Visit: Payer: Self-pay | Admitting: Cardiology

## 2016-02-14 NOTE — Telephone Encounter (Signed)
Rx(s) sent to pharmacy electronically.  

## 2016-02-16 ENCOUNTER — Ambulatory Visit (HOSPITAL_COMMUNITY)
Admission: RE | Admit: 2016-02-16 | Discharge: 2016-02-16 | Disposition: A | Discharge status: Home / Self Care | Payer: BLUE CROSS/BLUE SHIELD | Source: Ambulatory Visit | Attending: Cardiology | Admitting: Cardiology

## 2016-02-16 DIAGNOSIS — G4700 Insomnia, unspecified: Secondary | ICD-10-CM | POA: Diagnosis not present

## 2016-02-16 DIAGNOSIS — F419 Anxiety disorder, unspecified: Secondary | ICD-10-CM | POA: Insufficient documentation

## 2016-02-16 DIAGNOSIS — I6523 Occlusion and stenosis of bilateral carotid arteries: Secondary | ICD-10-CM

## 2016-02-16 DIAGNOSIS — E785 Hyperlipidemia, unspecified: Secondary | ICD-10-CM | POA: Diagnosis not present

## 2016-02-16 DIAGNOSIS — F329 Major depressive disorder, single episode, unspecified: Secondary | ICD-10-CM | POA: Diagnosis not present

## 2016-02-16 DIAGNOSIS — I2510 Atherosclerotic heart disease of native coronary artery without angina pectoris: Secondary | ICD-10-CM | POA: Insufficient documentation

## 2016-02-16 DIAGNOSIS — I10 Essential (primary) hypertension: Secondary | ICD-10-CM | POA: Insufficient documentation

## 2016-02-18 ENCOUNTER — Other Ambulatory Visit: Payer: Self-pay | Admitting: *Deleted

## 2016-02-18 DIAGNOSIS — I6523 Occlusion and stenosis of bilateral carotid arteries: Secondary | ICD-10-CM

## 2016-03-05 ENCOUNTER — Emergency Department (HOSPITAL_COMMUNITY)
Admission: EM | Admit: 2016-03-05 | Discharge: 2016-03-05 | Disposition: A | Discharge status: Home / Self Care | Payer: BLUE CROSS/BLUE SHIELD | Attending: Emergency Medicine | Admitting: Emergency Medicine

## 2016-03-05 ENCOUNTER — Encounter (HOSPITAL_COMMUNITY): Payer: Self-pay

## 2016-03-05 ENCOUNTER — Emergency Department (HOSPITAL_COMMUNITY): Payer: BLUE CROSS/BLUE SHIELD

## 2016-03-05 DIAGNOSIS — Z955 Presence of coronary angioplasty implant and graft: Secondary | ICD-10-CM | POA: Diagnosis not present

## 2016-03-05 DIAGNOSIS — R079 Chest pain, unspecified: Secondary | ICD-10-CM

## 2016-03-05 DIAGNOSIS — I252 Old myocardial infarction: Secondary | ICD-10-CM | POA: Diagnosis not present

## 2016-03-05 DIAGNOSIS — Z87891 Personal history of nicotine dependence: Secondary | ICD-10-CM | POA: Insufficient documentation

## 2016-03-05 DIAGNOSIS — Z79899 Other long term (current) drug therapy: Secondary | ICD-10-CM | POA: Insufficient documentation

## 2016-03-05 DIAGNOSIS — I2510 Atherosclerotic heart disease of native coronary artery without angina pectoris: Secondary | ICD-10-CM | POA: Diagnosis not present

## 2016-03-05 DIAGNOSIS — I10 Essential (primary) hypertension: Secondary | ICD-10-CM | POA: Diagnosis not present

## 2016-03-05 LAB — I-STAT TROPONIN, ED
TROPONIN I, POC: 0 ng/mL (ref 0.00–0.08)
Troponin i, poc: 0 ng/mL (ref 0.00–0.08)

## 2016-03-05 LAB — BASIC METABOLIC PANEL
Anion gap: 8 (ref 5–15)
BUN: 20 mg/dL (ref 6–20)
CHLORIDE: 110 mmol/L (ref 101–111)
CO2: 20 mmol/L — AB (ref 22–32)
CREATININE: 0.98 mg/dL (ref 0.44–1.00)
Calcium: 9.4 mg/dL (ref 8.9–10.3)
GFR calc non Af Amer: 60 mL/min — ABNORMAL LOW (ref >60)
GLUCOSE: 90 mg/dL (ref 65–99)
Potassium: 3.6 mmol/L (ref 3.5–5.1)
Sodium: 138 mmol/L (ref 135–145)

## 2016-03-05 LAB — CBC
HCT: 35 % — ABNORMAL LOW (ref 36.0–46.0)
HEMOGLOBIN: 11 g/dL — AB (ref 12.0–15.0)
MCH: 27.4 pg (ref 26.0–34.0)
MCHC: 31.4 g/dL (ref 30.0–36.0)
MCV: 87.1 fL (ref 78.0–100.0)
PLATELETS: 280 10*3/uL (ref 150–400)
RBC: 4.02 MIL/uL (ref 3.87–5.11)
RDW: 15.4 % (ref 11.5–15.5)
WBC: 7.3 10*3/uL (ref 4.0–10.5)

## 2016-03-05 MED ORDER — ALBUTEROL (5 MG/ML) CONTINUOUS INHALATION SOLN: 10.0000 mg/h | INHALATION_SOLUTION | Freq: Once | RESPIRATORY_TRACT | Status: DC

## 2016-03-05 NOTE — ED Notes (Signed)
MD at beside

## 2016-03-05 NOTE — ED Triage Notes (Signed)
Patient comes from homes with complaints of chest pain.  Started in her right shoulder blade last evening and now having active chest pain. Had a MI before and states this feels like that did.  Patient A&Ox4

## 2016-03-05 NOTE — ED Notes (Signed)
Patient Alert and oriented X4. Stable and ambulatory. Patient verbalized understanding of the discharge instructions.  Patient belongings were taken by the patient.  

## 2016-03-05 NOTE — ED Triage Notes (Signed)
Patient took 324 of her aspirin at home and 3 of her own nitro.  Little to no relief

## 2016-03-06 NOTE — ED Provider Notes (Signed)
[facility]-EMERGENCY DEPT Provider Note   CSN: [id number] Arrival date & time: 03/05/16  1811  First Provider Contact:  1900 p.m.      History   Chief Complaint Chief Complaint  Patient presents with  . Chest Pain    HPI Laura Herrera is a 65 y.o. female.  She presents with chest pain for the last 24 hours. States she had some pain in her right shoulder blade last night. Now is more anterior. Hurts to move. No cough or shortness of breath. No nausea. History of previous injury wall MI with difficult approach PTCA but was able to be successfully stented. I was August of last year. Earlier this year presented with an episode of chest pain and in STEMI. Head angiographic with a normal vessels patent stent but had marked anterior wall changes with diagnosed with Takatsubu's syndrome. Her EF recovered. She's had 2 additional episodes where she was evaluated for chest pain with normal evaluation.  Was on Effient until she finished her 1 year prescription week ago and was transitioned to Plavix which she is compliant with.  HPI  Past Medical History:  Diagnosis Date  . Anxiety   . Chest pain with high risk for cardiac etiology 11/19/2015   Cardiac catheterization showed stable coronaries with patent RCA stents. FFR of LAD negative. --> LV gram EF suggested Takotsubo versus LAD spasm.  . Complication of anesthesia    pseudo cholinesterace deficiency  . Coronary artery disease    a. 01/2015 Inf STEMI/PCI: RCA 80ost (2.75x12 Promus Premier DES), 100p (2.5x24 Promus Premier DES), 70p/m (2.5x24 Promus Premier DES), 90d (PTCA);  b. 01/2015 Relook Cath: LM nl, LAD 76m, D1 min irregs, LCX nl- L->R collats,  OM1 mod dzs, OM2 mild dizs, RCA 10ost, heavy thrombus p/m, 10d->Aggrastat;  c. 01/2015 Echo: EF 60-65%, no rwma, mod AI, PASP .  . Depression   . Dyslipidemia   . Headache    "used to have them daily; I no longer do" (11/09/2015)  . Hypertension   . Insomnia   . Kidney stones    "years ago;  passed it"  . MRSA infection    h/o MRSA infection in the right foot  . Myocardial infarction (HCC) 01/2015  . NSTEMI (non-ST elevated myocardial infarction) (HCC) 11/09/2015  . PONV (postoperative nausea and vomiting)   . Pseudocholinesterase deficiency   . Psoriasis    hand and foot (11/09/2015)    Patient Active Problem List   Diagnosis Date Noted  . Exertional dyspnea 01/27/2016  . Coronary artery disease with angina pectoris with documented spasm (HCC) 11/12/2015  . Bruising 09/16/2015  . Heart palpitations 09/16/2015  . Bilateral lower extremity edema 04/22/2015  . Hyperlipidemia with target LDL less than 70 04/21/2015  . ST elevation myocardial infarction (STEMI) of inferior wall, subsequent episode of care (HCC) 01/11/2015  . Essential hypertension 01/11/2015  . CAD S/P PCI of the ostial and mid/distal RCA 01/11/2015  . UNSPECIFIED ADVERSE EFFECT OF ANESTHESIA 02/12/2009  . ADJUSTMENT DISORDER WITH DEPRESSED MOOD 01/17/2008  . ABSCESS, FOOT 01/17/2008    Past Surgical History:  Procedure Laterality Date  . ANGIOPLASTY  01/12/2015   Procedure: Angioplasty;  Surgeon: Lyn Records, MD;  Location: [facility] INVASIVE CV LAB;  Service: Cardiovascular;;  . APPENDECTOMY    . CARDIAC CATHETERIZATION N/A 01/11/2015   Procedure: Left Heart Cath and Coronary Angiography;  Surgeon: Marykay Lex, MD;  Location: [facility] INVASIVE CV LAB;  Service: Cardiovascular;  Laterality: N/A;  . CARDIAC CATHETERIZATION  01/11/2015   Procedure: Coronary Stent Intervention;  Surgeon: Marykay Lex, MD;  Location: [facility] INVASIVE CV LAB;  Service: Cardiovascular;;  . CARDIAC CATHETERIZATION  01/11/2015   Procedure: Coronary Balloon Angioplasty;  Surgeon: Marykay Lex, MD;  Location: [facility] INVASIVE CV LAB;  Service: Cardiovascular;;  . CARDIAC CATHETERIZATION N/A 01/12/2015   Procedure: Left Heart Cath and Coronary Angiography;  Surgeon: Lyn Records, MD;  Location: [facility] INVASIVE CV LAB;  Service: Cardiovascular;  Laterality:  N/A;  . CARDIAC CATHETERIZATION N/A 11/10/2015   Procedure: Left Heart Cath and Coronary Angiography;  Surgeon: Lennette Bihari, MD;  Location: [facility] INVASIVE CV LAB;  Service: Cardiovascular; patent RCA stents. Ostial LM 30%, ostial LAD 40% - FFR negative. Moderate-severe LV dysfunction with hypo-kinesis involving the anterior wall to the apex. EF suggested 30%.   . CARDIAC CATHETERIZATION N/A 11/10/2015   Procedure: Intravascular Pressure Wire/FFR Study;  Surgeon: Lennette Bihari, MD;  Location: [facility] INVASIVE CV LAB;  Service: Cardiovascular;  FFR of LAD negative.  Marland Kitchen DILATION AND CURETTAGE OF UTERUS  1979   S/P miscarriage  . INCISION AND DRAINAGE FOOT Right    "spider bite"  . NOSE SURGERY  X 2   "to remove genetic bone spur"  . TRANSTHORACIC ECHOCARDIOGRAM  11/11/2015   Normal LV size and function. Vigorous function with EF 6570%. GR 1 DD. Mild-moderate MR.    OB History    No data available       Home Medications    Prior to Admission medications   Medication Sig Start Date End Date Taking? Authorizing Provider  acetaminophen (TYLENOL) 500 MG tablet Take 500 mg by mouth every 6 (six) hours as needed for headache.   Yes Historical Provider, MD  ALPRAZolam (XANAX) 0.25 MG tablet TAKE 1 TABLET TWICE A DAY AS NEEDED FOR ANXIETY 11/12/15  Yes Historical Provider, MD  atorvastatin (LIPITOR) 40 MG tablet Take 1 tablet (40 mg total) by mouth daily. Patient taking differently: Take 40 mg by mouth daily at 12 noon.  12/15/15  Yes Janetta Hora, PA-C  cholecalciferol (VITAMIN D) 1000 UNITS tablet Take 1,000 Units by mouth daily.   Yes Historical Provider, MD  clobetasol cream (TEMOVATE) 0.05 % Apply 1 application topically 2 (two) times daily.   Yes Historical Provider, MD  clopidogrel (PLAVIX) 75 MG tablet Take 1 tablet (75 mg total) by mouth daily. Patient taking differently: Take 75 mg by mouth at bedtime. Midnight 01/25/16  Yes Marykay Lex, MD  furosemide (LASIX) 20 MG tablet Take 1 tablet  by mouth every Monday, Wednesday, Friday 02/14/16  Yes Marykay Lex, MD  isosorbide mononitrate (IMDUR) 60 MG 24 hr tablet Take 1 tablet (60 mg total) by mouth daily. Patient taking differently: Take 60 mg by mouth daily at 12 noon.  12/15/15  Yes Janetta Hora, PA-C  metoprolol succinate (TOPROL-XL) 25 MG 24 hr tablet Take 0.5 tablets (12.5 mg total) by mouth daily. Patient taking differently: Take 12.5 mg by mouth at bedtime. Midnight 09/14/15  Yes Marykay Lex, MD  nitroGLYCERIN (NITROSTAT) 0.4 MG SL tablet Place 1 tablet (0.4 mg total) under the tongue every 5 (five) minutes x 3 doses as needed for chest pain. 01/17/15  Yes Ok Anis, NP  pantoprazole (PROTONIX) 40 MG tablet TAKE 1 TABLET BY MOUTH DAILY AT 12 NOON. 02/14/16  Yes Marykay Lex, MD  prasugrel (EFFIENT) 10 MG TABS tablet Take 1 tablet (10 mg total) by mouth daily. Patient not taking:  Reported on 03/05/2016 01/26/16   Rosalio Macadamia, NP    Family History Family History  Problem Relation Age of Onset  . Peripheral vascular disease Mother   . Hyperlipidemia Mother   . Alzheimer's disease Father   . Mental illness Sister   . Heart Problems Sister   . Schizophrenia Sister   . Hypotension Sister   . Hyperlipidemia Brother   . Heart disease Maternal Grandmother   . Heart attack Neg Hx     Social History Social History  Substance Use Topics  . Smoking status: Former Smoker    Packs/day: 0.10    Years: 2.00    Types: Cigarettes  . Smokeless tobacco: Never Used     Comment: "quit smoking in the 1980s"  . Alcohol use 1.2 oz/week    2 Glasses of wine per week     Allergies   Cholinesterase inhibitors; Other; Adhesive [tape]; Shellfish-derived products; Epinephrine; and Vancomycin   Review of Systems Review of Systems  Constitutional: Negative for appetite change, chills, diaphoresis, fatigue and fever.  HENT: Negative for mouth sores, sore throat and trouble swallowing.   Eyes: Negative for visual  disturbance.  Respiratory: Negative for cough, chest tightness, shortness of breath and wheezing.   Cardiovascular: Positive for chest pain.  Gastrointestinal: Negative for abdominal distention, abdominal pain, diarrhea, nausea and vomiting.  Endocrine: Negative for polydipsia, polyphagia and polyuria.  Genitourinary: Negative for dysuria, frequency and hematuria.  Musculoskeletal: Negative for gait problem.  Skin: Negative for color change, pallor and rash.  Neurological: Negative for dizziness, syncope, light-headedness and headaches.  Hematological: Does not bruise/bleed easily.  Psychiatric/Behavioral: Negative for behavioral problems and confusion.     Physical Exam Updated Vital Signs BP 138/55 (BP Location: Right Arm)   Pulse (!) 54   Temp 97.9 F (36.6 C) (Oral)   Resp 16   SpO2 100%   Physical Exam  Constitutional: She is oriented to person, place, and time. She appears well-developed and well-nourished. No distress.  HENT:  Head: Normocephalic.  Eyes: Conjunctivae are normal. Pupils are equal, round, and reactive to light. No scleral icterus.  Neck: Normal range of motion. Neck supple. No thyromegaly present.  Cardiovascular: Normal rate and regular rhythm.  Exam reveals no gallop and no friction rub.   No murmur heard. Pulmonary/Chest: Effort normal and breath sounds normal. No respiratory distress. She has no wheezes. She has no rales.  Abdominal: Soft. Bowel sounds are normal. She exhibits no distension. There is no tenderness. There is no rebound.  Musculoskeletal: Normal range of motion.  Neurological: She is alert and oriented to person, place, and time.  Skin: Skin is warm and dry. No rash noted.  Psychiatric: She has a normal mood and affect. Her behavior is normal.     ED Treatments / Results  Labs (all labs ordered are listed, but only abnormal results are displayed) Labs Reviewed  BASIC METABOLIC PANEL - Abnormal; Notable for the following:        Result Value   CO2 20 (*)    GFR calc non Af Amer 60 (*)    All other components within normal limits  CBC - Abnormal; Notable for the following:    Hemoglobin 11.0 (*)    HCT 35.0 (*)    All other components within normal limits  I-STAT TROPOININ, ED  Rosezena Sensor, ED    EKG  EKG Interpretation None       Radiology Dg Chest 2 View  Result Date: 03/05/2016 CLINICAL  DATA:  Chest pain. EXAM: CHEST  2 VIEW COMPARISON:  11/18/2015 FINDINGS: Cardiomediastinal silhouette is normal. Mediastinal contours appear intact. Tortuosity and atherosclerotic disease of the aorta noted. There is no evidence of focal airspace consolidation, pleural effusion or pneumothorax. Osseous structures are without acute abnormality. Soft tissues are grossly normal. IMPRESSION: No active cardiopulmonary disease. Calcific atherosclerotic disease of the aorta. Electronically Signed   By: Ted Mcalpine M.D.   On: 03/05/2016 19:18   Procedures Procedures (including critical care time)  Medications Ordered in ED Medications - No data to display   Initial Impression / Assessment and Plan / ED Course  I have reviewed the triage vital signs and the nursing notes.  Pertinent labs & imaging results that were available during my care of the patient were reviewed by me and considered in my medical decision making (see chart for details).  Clinical Course    Case discussed with Dr. Donnie Aho cardiologist on-call. He is reviewed the patient's record. We discussed the patient. My impression is that this is not typical for pain consistent with angina. Patient has a normal EKG she has normal troponins 2. She had pain that was coming and going well in the department or EKG was repeated during an episode of pain in the EKG is not anemic. I think she is appropriate for discharge home to follow-up with her primary cardiologist routinely. ER with acute changes.  Final Clinical Impressions(s) / ED Diagnoses   Final  diagnoses:  Chest pain, unspecified chest pain type    New Prescriptions Discharge Medication List as of 03/05/2016 11:24 PM       Rolland Porter, MD 03/06/16 [phone]

## 2016-07-26 ENCOUNTER — Ambulatory Visit: Payer: BLUE CROSS/BLUE SHIELD | Admitting: Cardiology

## 2016-08-30 ENCOUNTER — Other Ambulatory Visit: Payer: Self-pay | Admitting: Cardiology

## 2016-09-14 ENCOUNTER — Ambulatory Visit: Payer: BLUE CROSS/BLUE SHIELD | Admitting: Cardiology

## 2016-10-06 ENCOUNTER — Ambulatory Visit (INDEPENDENT_AMBULATORY_CARE_PROVIDER_SITE_OTHER): Payer: BLUE CROSS/BLUE SHIELD | Admitting: Cardiology

## 2016-10-06 ENCOUNTER — Encounter: Payer: Self-pay | Admitting: Cardiology

## 2016-10-06 VITALS — BP 160/78 | HR 62 | Ht 67.0 in | Wt 171.0 lb

## 2016-10-06 DIAGNOSIS — I2119 ST elevation (STEMI) myocardial infarction involving other coronary artery of inferior wall: Secondary | ICD-10-CM | POA: Diagnosis not present

## 2016-10-06 DIAGNOSIS — R002 Palpitations: Secondary | ICD-10-CM

## 2016-10-06 DIAGNOSIS — I25111 Atherosclerotic heart disease of native coronary artery with angina pectoris with documented spasm: Secondary | ICD-10-CM | POA: Diagnosis not present

## 2016-10-06 DIAGNOSIS — I2510 Atherosclerotic heart disease of native coronary artery without angina pectoris: Secondary | ICD-10-CM | POA: Diagnosis not present

## 2016-10-06 DIAGNOSIS — I10 Essential (primary) hypertension: Secondary | ICD-10-CM | POA: Diagnosis not present

## 2016-10-06 DIAGNOSIS — E785 Hyperlipidemia, unspecified: Secondary | ICD-10-CM

## 2016-10-06 DIAGNOSIS — Z9861 Coronary angioplasty status: Secondary | ICD-10-CM

## 2016-10-06 MED ORDER — AMLODIPINE BESYLATE 2.5 MG PO TABS: 2.5000 mg | ORAL_TABLET | Freq: Every day | ORAL | 3 refills | Status: DC

## 2016-10-06 NOTE — Progress Notes (Addendum)
PCP: Darrow Bussing, MD  Clinic Note: Chief Complaint  Patient presents with  . Follow-up    CAD, carotid artery stenosis    HPI: Laura Herrera is a 66 y.o. female with a PMH below who presents today for 6+ month follow-up for her CAD with history of inferior STEMI in 2016..  Inferior STEMI 01/11/2015 -> severe ostial RCA mid RCA disease 100% occlusion in the mid vessel. Significant thrombus with distal embolization -->   2 Promus Premier DES stents: 2.25 mm x 24 mm covering proximal and mid lesions with 2.75 mm x 12 mm stent in the ostial proximal segment overlapping. PTCA of distal RCA. -->   Relook cath with patent stents and PTCA site heavy thrombus noted.  Myoview 03/08/2015: LOW risk. Medium sized mild intensity partially reversible defect in the basal inferior, basal inferolateral and mid inferolateral wall suggestive of subendocardial MI in the inferior wall. No ischemia. EF greater than 65%.  Mild MI in 4/2-17 - cath with patent stents & Neg LAD FFR. Had Anterior HK (TakoTsubo pattern - thought to be coronary spasm)   [name] was last seen o in June 2017. She was getting back to her baseline level activity. She will but she was recovering from her Takotsubo type event.  Recent Hospitalizations: Chest pain evaluation in July 2017  Studies Reviewed:   Carotid Dopplers in July 2017: Mild to moderate bilateral disease. Plan to repeat July 2018  Interval History: Laura Herrera returns today feeling quite well. She had not had any further heart failure symptoms is suggested that she has residual effect of her Syncopal event. She states that her palpitations are pretty stable now and not really worrisome to her. Knowing that they're benign and self-limiting was helpful for her - She notes that may be 2 times a week or so. Mostly when she notes her chest discomfort is when she has anxiety. It usually resolves with taking Xanax. She has not had any recurrent chest pain at  rest or with exertion since last visit. No resting or exertional dyspnea, except for what would be expected from her being deconditioned.  No lightheadedness, dizziness, weakness or syncope/near syncope. No TIA/amaurosis fugax symptoms. No melena, hematochezia, hematuria, or epstaxis - but she does note bruising on her hands No claudication.  ROS: A comprehensive was performed. Review of Systems  Cardiovascular:       Per history of present illness  Musculoskeletal: Negative for myalgias.  Neurological: Positive for dizziness (Sometimes positional).  Endo/Heme/Allergies: Bruises/bleeds easily.  Psychiatric/Behavioral: The patient is nervous/anxious.   All other systems reviewed and are negative.   Past Medical History:  Diagnosis Date  . Anxiety   . Chest pain with high risk for cardiac etiology 11/19/2015   Cardiac catheterization showed stable coronaries with patent RCA stents. FFR of LAD negative. --> LV gram EF suggested Takotsubo versus LAD spasm.  . Complication of anesthesia    pseudo cholinesterace deficiency  . Coronary artery disease    a. 01/2015 Inf STEMI/PCI: RCA 80ost (2.75x12 Promus Premier DES), 100p (2.5x24 Promus Premier DES), 70p/m (2.5x24 Promus Premier DES), 90d (PTCA);  b. 01/2015 Relook Cath: LM nl, LAD 54m, D1 min irregs, LCX nl- L->R collats,  OM1 mod dzs, OM2 mild dizs, RCA 10ost, heavy thrombus p/m, 10d->Aggrastat;  c. 01/2015 Echo: EF 60-65%, no rwma, mod AI, PASP .  . Depression   . Dyslipidemia   . Headache    "used to have them daily; I no longer do" (11/09/2015)  .  Hypertension   . Insomnia   . Kidney stones    "years ago; passed it"  . MRSA infection    h/o MRSA infection in the right foot  . Myocardial infarction 01/2015  . NSTEMI (non-ST elevated myocardial infarction) (HCC) 11/09/2015  . PONV (postoperative nausea and vomiting)   . Pseudocholinesterase deficiency   . Psoriasis    hand and foot (11/09/2015)    Past Surgical History:    Procedure Laterality Date  . ANGIOPLASTY  01/12/2015   Procedure: Angioplasty;  Surgeon: Lyn Records, MD;  Location: [facility] INVASIVE CV LAB;  Service: Cardiovascular;;  . APPENDECTOMY    . CARDIAC CATHETERIZATION N/A 01/11/2015   Procedure: Left Heart Cath and Coronary Angiography;  Surgeon: Marykay Lex, MD;  Location: [facility] INVASIVE CV LAB;  Service: Cardiovascular;  Laterality: N/A;  . CARDIAC CATHETERIZATION  01/11/2015   Procedure: Coronary Stent Intervention;  Surgeon: Marykay Lex, MD;  Location: [facility] INVASIVE CV LAB;  Service: Cardiovascular;;  . CARDIAC CATHETERIZATION  01/11/2015   Procedure: Coronary Balloon Angioplasty;  Surgeon: Marykay Lex, MD;  Location: [facility] INVASIVE CV LAB;  Service: Cardiovascular;;  . CARDIAC CATHETERIZATION N/A 01/12/2015   Procedure: Left Heart Cath and Coronary Angiography;  Surgeon: Lyn Records, MD;  Location: [facility] INVASIVE CV LAB;  Service: Cardiovascular;  Laterality: N/A;  . CARDIAC CATHETERIZATION N/A 11/10/2015   Procedure: Left Heart Cath and Coronary Angiography;  Surgeon: Lennette Bihari, MD;  Location: [facility] INVASIVE CV LAB;  Service: Cardiovascular; patent RCA stents. Ostial LM 30%, ostial LAD 40% - FFR negative. Moderate-severe LV dysfunction with hypo-kinesis involving the anterior wall to the apex. EF suggested 30%.   . CARDIAC CATHETERIZATION N/A 11/10/2015   Procedure: Intravascular Pressure Wire/FFR Study;  Surgeon: Lennette Bihari, MD;  Location: [facility] INVASIVE CV LAB;  Service: Cardiovascular;  FFR of LAD negative.  Marland Kitchen DILATION AND CURETTAGE OF UTERUS  1979   S/P miscarriage  . INCISION AND DRAINAGE FOOT Right    "spider bite"  . NOSE SURGERY  X 2   "to remove genetic bone spur"  . TRANSTHORACIC ECHOCARDIOGRAM  11/11/2015   Normal LV size and function. Vigorous function with EF 6570%. GR 1 DD. Mild-moderate MR.    Current Meds  Medication Sig  . acetaminophen (TYLENOL) 500 MG tablet Take 500 mg by mouth every 6 (six) hours as needed for headache.  .  ALPRAZolam (XANAX) 0.25 MG tablet TAKE 1 TABLET TWICE A DAY AS NEEDED FOR ANXIETY  . atorvastatin (LIPITOR) 40 MG tablet Take 1 tablet (40 mg total) by mouth daily. (Patient taking differently: Take 40 mg by mouth daily at 12 noon. )  . cholecalciferol (VITAMIN D) 1000 UNITS tablet Take 1,000 Units by mouth daily.  . clobetasol cream (TEMOVATE) 0.05 % Apply 1 application topically 2 (two) times daily.  . clopidogrel (PLAVIX) 75 MG tablet Take 1 tablet (75 mg total) by mouth daily. (Patient taking differently: Take 75 mg by mouth at bedtime. Midnight)  . furosemide (LASIX) 20 MG tablet Take 1 tablet by mouth every Monday, Wednesday, Friday  . isosorbide mononitrate (IMDUR) 60 MG 24 hr tablet Take 1 tablet (60 mg total) by mouth daily. (Patient taking differently: Take 60 mg by mouth daily at 12 noon. )  . metoprolol succinate (TOPROL-XL) 25 MG 24 hr tablet TAKE 0.5 TABLETS (12.5 MG TOTAL) BY MOUTH DAILY.  . nitroGLYCERIN (NITROSTAT) 0.4 MG SL tablet Place 1 tablet (0.4 mg total) under the tongue every 5 (five)  minutes x 3 doses as needed for chest pain.  . pantoprazole (PROTONIX) 40 MG tablet TAKE 1 TABLET BY MOUTH DAILY AT 12 NOON.  . [DISCONTINUED] prasugrel (EFFIENT) 10 MG TABS tablet Take 1 tablet (10 mg total) by mouth daily.    Allergies  Allergen Reactions  . 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  . Other Other (See Comments)    Sensitive to the electrodes  . Adhesive [Tape] Other (See Comments)    Tears skin off, and "paper" tape also  . Shellfish-Derived Products Swelling    Crabs, lobster, crayfish; tissues in joints swelled  . Epinephrine Palpitations    Palpitations and involuntary body jerking  . Vancomycin Hives and Rash    severe     Social History   Social History  . Marital status: Widowed    Spouse name: N/A  . Number of children: N/A  . Years of education: N/A   Occupational  History  . unemployed    Social History Main Topics  . Smoking status: Former Smoker    Packs/day: 0.10    Years: 2.00    Types: Cigarettes  . Smokeless tobacco: Never Used     Comment: "quit smoking in the 1980s"  . Alcohol use 1.2 oz/week    2 Glasses of wine per week  . Drug use: No  . Sexual activity: Yes    Birth control/ protection: Post-menopausal   Other Topics Concern  . None   Social History Narrative   She is under a significant amount of stress socially.    Her husband committed suicide several years ago - she continues to have difficulty with coping.    family history includes Alzheimer's disease in her father; Heart Problems in her sister; Heart disease in her maternal grandmother; Hyperlipidemia in her brother and mother; Hypotension in her sister; Mental illness in her sister; Peripheral vascular disease in her mother; Schizophrenia in her sister.  Wt Readings from Last 3 Encounters:  10/06/16 77.6 kg (171 lb)  01/25/16 78.5 kg (173 lb)  12/15/15 77.7 kg (171 lb 3.2 oz)    PHYSICAL EXAM BP (!) 160/78   Pulse 62   Ht 5\' 7"  (1.702 m)   Wt 77.6 kg (171 lb)   BMI 26.78 kg/m  General appearance: alert, cooperative, appears stated age, no distress and Relatively healthy appearing. Notably less anxious Neck: no adenopathy, no carotid bruit, no JVD and supple, symmetrical, trachea midline Lungs: CTA B, normal percussion bilaterally and Nonlabored, good air movement Heart: Bradycardic but regular rhythm, S1, S2 normal, no murmur, click, rub or gallop and normal apical impulse Abdomen: soft, non-tender; bowel sounds normal; no masses, no organomegaly Extremities: No clubbing or cyanosis. Mild trace to 1+ puffy edema in the ankles bilaterally. Pulses: 2+ and symmetric Skin: Skin color, texture, turgor normal. No rashes or lesions Neurologic: Alert and oriented X 3, normal strength and tone. Normal symmetric reflexes. Normal coordination and gait    Adult ECG  Report  Rate: 62;  Rhythm: normal sinus rhythm and Normal axis, intervals and durations.;   Narrative Interpretation: Normal EKG   Other studies Reviewed: Additional studies/ records that were reviewed today include:  Recent Labs:    Lab Results  Component Value Date   CHOL 149 11/10/2015   HDL 73 11/10/2015   LDLCALC 56 11/10/2015   TRIG 98 11/10/2015   CHOLHDL 2.0 11/10/2015    ASSESSMENT / PLAN: Problem List  Items Addressed This Visit    CAD S/P PCI of the ostial and mid/distal RCA - Primary (Chronic)    She is very difficult PCI of the RCA with stents patent in the relook cath. We switched her to Plavix when I last saw her.  Given the extent of stents that she has, I would like to keep her on Plavix.  She is on a beta blocker and statin. Adding amlodipine for potential spasm related antianginal effect.      Relevant Medications   amLODipine (NORVASC) 2.5 MG tablet   Other Relevant Orders   EKG 12-Lead   Comprehensive metabolic panel   Lipid panel   Coronary artery disease with angina pectoris with documented spasm (HCC) (Chronic)    With her having some hypertension still, chosen to start her on a calcium channel blocker additional blood pressure control which would help treat against spasm. His concern for possible spasm in the last catheterization in the LAD. She is no longer on Imdur or Ranexa. She is on statin and beta blocker, and Plavix      Relevant Medications   amLODipine (NORVASC) 2.5 MG tablet   Other Relevant Orders   EKG 12-Lead   Comprehensive metabolic panel   Lipid panel   Essential hypertension (Chronic)    Blood pressure still high today. Plan is to add amlodipine as opposed ACE inhibitor for antianginal effect given concern for possible spasm  I will have her follow-up with Randall An, PA in 3 months to determine if we can potentially titrate amlodipine further.      Relevant Medications   amLODipine (NORVASC) 2.5 MG tablet   Other  Relevant Orders   EKG 12-Lead   Comprehensive metabolic panel   Lipid panel   Heart palpitations (Chronic)    Stable on current dose of beta blocker.      Hyperlipidemia with target LDL less than 70 (Chronic)    Well-controlled last April on current dose atorvastatin. She should be due for recheck now.      Relevant Medications   amLODipine (NORVASC) 2.5 MG tablet   Other Relevant Orders   EKG 12-Lead   Comprehensive metabolic panel   Lipid panel   ST elevation myocardial infarction (STEMI) of inferior wall, subsequent episode of care (HCC) (Chronic)    Some small evidence of infarct on her stress test, but no recurrent symptoms and improved ejection fraction. Relook cath last year showed patent stents and negative FFR in the LAD. No recurrent anginal symptoms on current medications.      Relevant Medications   amLODipine (NORVASC) 2.5 MG tablet   Other Relevant Orders   EKG 12-Lead   Comprehensive metabolic panel   Lipid panel      Current medicines are reviewed at length with the patient today. (+/- concerns) None The following changes have been made: None  Patient Instructions  Medication changes: Start Amlodipine 2.5 mg once daily  Please have these labs done, CMP and Lipids. You will need to have nothing to eat or drink that morning of the test.  Your physician recommends that you schedule a follow-up appointment in 3 month with Randall An, PA  Your physician wants you to follow-up in 6 months with Dr. Herbie Baltimore. You will receive a reminder letter in the mail two months in advance. If you don't receive a letter, please call our office to schedule the follow-up appointment.    Studies Ordered:   Orders Placed This Encounter  Procedures  . Comprehensive  metabolic panel  . Lipid panel  . EKG 12-Lead      [name], M.D., M.S. Interventional Cardiologist   Pager # [phone] Phone # [phone] [address] [place], [place]  [address]

## 2016-10-06 NOTE — Patient Instructions (Signed)
Medication changes: Start Amlodipine 2.5 mg once daily  Please have these labs done, CMP and Lipids. You will need to have nothing to eat or drink that morning of the test.  Your physician recommends that you schedule a follow-up appointment in 3 month with Randall An, PA  Your physician wants you to follow-up in 6 months with Dr. Herbie Baltimore. You will receive a reminder letter in the mail two months in advance. If you don't receive a letter, please call our office to schedule the follow-up appointment.

## 2016-10-08 ENCOUNTER — Encounter: Payer: Self-pay | Admitting: Cardiology

## 2016-10-08 NOTE — Assessment & Plan Note (Addendum)
Blood pressure still high today. Plan is to add amlodipine as opposed ACE inhibitor for antianginal effect given concern for possible spasm  I will have her follow-up with Randall An, PA in 3 months to determine if we can potentially titrate amlodipine further.

## 2016-10-08 NOTE — Assessment & Plan Note (Addendum)
With her having some hypertension still, chosen to start her on a calcium channel blocker additional blood pressure control which would help treat against spasm. His concern for possible spasm in the last catheterization in the LAD. She is no longer on Imdur or Ranexa. She is on statin and beta blocker, and Plavix

## 2016-10-08 NOTE — Assessment & Plan Note (Signed)
Well-controlled last April on current dose atorvastatin. She should be due for recheck now.

## 2016-10-08 NOTE — Assessment & Plan Note (Signed)
Some small evidence of infarct on her stress test, but no recurrent symptoms and improved ejection fraction. Relook cath last year showed patent stents and negative FFR in the LAD. No recurrent anginal symptoms on current medications.

## 2016-10-08 NOTE — Assessment & Plan Note (Signed)
Stable on current dose of beta-blocker. 

## 2016-10-08 NOTE — Assessment & Plan Note (Addendum)
She is very difficult PCI of the RCA with stents patent in the relook cath. We switched her to Plavix when I last saw her.  Given the extent of stents that she has, I would like to keep her on Plavix.  She is on a beta blocker and statin. Adding amlodipine for potential spasm related antianginal effect.

## 2016-10-30 ENCOUNTER — Other Ambulatory Visit: Payer: Self-pay | Admitting: Cardiology

## 2016-11-29 ENCOUNTER — Other Ambulatory Visit: Payer: Self-pay | Admitting: Obstetrics & Gynecology

## 2016-11-29 DIAGNOSIS — Z1231 Encounter for screening mammogram for malignant neoplasm of breast: Secondary | ICD-10-CM

## 2016-12-09 ENCOUNTER — Other Ambulatory Visit: Payer: Self-pay | Admitting: Physician Assistant

## 2016-12-20 ENCOUNTER — Ambulatory Visit
Admission: RE | Admit: 2016-12-20 | Discharge: 2016-12-20 | Disposition: A | Discharge status: Home / Self Care | Payer: Medicare Other | Source: Ambulatory Visit | Attending: Obstetrics & Gynecology | Admitting: Obstetrics & Gynecology

## 2016-12-20 DIAGNOSIS — Z1231 Encounter for screening mammogram for malignant neoplasm of breast: Secondary | ICD-10-CM

## 2016-12-27 ENCOUNTER — Ambulatory Visit: Payer: Medicare Other | Admitting: Student

## 2017-01-05 ENCOUNTER — Ambulatory Visit: Payer: Medicare Other | Admitting: Student

## 2017-01-09 NOTE — Progress Notes (Signed)
Cardiology Office Note    Date:  01/10/2017   ID:  Laura Herrera, DOB [birth year], MRN [id number]  PCP:  Darrow Bussing, MD  Cardiologist: Dr. Herbie Baltimore   Chief Complaint  Patient presents with  . Follow-up    3 months    History of Present Illness:    Laura Herrera is a 66 y.o. female with past medical history of CAD (s/p STEMI in 2016 with 100% occlusion of RCA with distal embolization (DES x2 placed) with relook cath showing patent stents, patent by cath in 2017), HTN, HLD who presents to the office today for 18-month follow-up.   She was last examined by Dr. Herbie Baltimore on 10/06/2016 and reported doing well from a cardiac perspective at that time. She reported stable palpitations but no acute changes in her symptoms. Did report occasional chest discomfort when she had associated anxiety which improved with Xanax. She has been continued on Plavix given her significant stenting of the RCA. She was started on Amlodipine 2.5mg  daily due to her elevated BP (160/78) and for concern of potential spasm at the time of her cath in 11/2015.  In talking with the patient today, she reports only one episode of chest discomfort since her last office visit. This occurred when she was under stress and says it improved when she took half of a Xanax tablet. She denies any dyspnea on exertion or chest pain on exertion.  She has not checked her blood pressure regularly but says she checks this when she feels dizzy. The dizziness usually occurs after she takes her diuretic and she notes her SBP has been in the low 100's at that time. She says she has only checked her blood pressure a "hanf full of times" since her last visit and is unable to give me a summary of her readings.  She denies any recent orthopnea, PND, lower extremity edema, or palpitations. She reports tolerating the Amlodipine well since this was initiated at her last office visit.  Past Medical History:  Diagnosis Date  . Anxiety   . Chest  pain with high risk for cardiac etiology 11/19/2015   Cardiac catheterization showed stable coronaries with patent RCA stents. FFR of LAD negative. --> LV gram EF suggested Takotsubo versus LAD spasm.  . Complication of anesthesia    pseudo cholinesterace deficiency  . Coronary artery disease    a. 01/2015 Inf STEMI/PCI: RCA 80ost (2.75x12 Promus Premier DES), 100p (2.5x24 Promus Premier DES), 70p/m (2.5x24 Promus Premier DES), 90d (PTCA);  b. 01/2015 Relook Cath: LM nl, LAD 74m, D1 min irregs, LCX nl- L->R collats,  OM1 mod dzs, OM2 mild dizs, RCA 10ost, heavy thrombus p/m, 10d->Aggrastat;  c. 01/2015 Echo: EF 60-65%, no rwma, mod AI, PASP . d. 11/2015: cath showing patent stents   . Depression   . Dyslipidemia   . Headache    "used to have them daily; I no longer do" (11/09/2015)  . Hypertension   . Insomnia   . Kidney stones    "years ago; passed it"  . MRSA infection    h/o MRSA infection in the right foot  . Myocardial infarction (HCC) 01/2015  . NSTEMI (non-ST elevated myocardial infarction) (HCC) 11/09/2015  . PONV (postoperative nausea and vomiting)   . Pseudocholinesterase deficiency   . Psoriasis    hand and foot (11/09/2015)    Past Surgical History:  Procedure Laterality Date  . ANGIOPLASTY  01/12/2015   Procedure: Angioplasty;  Surgeon: Lyn Records, MD;  Location:  [facility] INVASIVE CV LAB;  Service: Cardiovascular;;  . APPENDECTOMY    . CARDIAC CATHETERIZATION N/A 01/11/2015   Procedure: Left Heart Cath and Coronary Angiography;  Surgeon: Marykay Lex, MD;  Location: [facility] INVASIVE CV LAB;  Service: Cardiovascular;  Laterality: N/A;  . CARDIAC CATHETERIZATION  01/11/2015   Procedure: Coronary Stent Intervention;  Surgeon: Marykay Lex, MD;  Location: [facility] INVASIVE CV LAB;  Service: Cardiovascular;;  . CARDIAC CATHETERIZATION  01/11/2015   Procedure: Coronary Balloon Angioplasty;  Surgeon: Marykay Lex, MD;  Location: [facility] INVASIVE CV LAB;  Service: Cardiovascular;;  . CARDIAC  CATHETERIZATION N/A 01/12/2015   Procedure: Left Heart Cath and Coronary Angiography;  Surgeon: Lyn Records, MD;  Location: [facility] INVASIVE CV LAB;  Service: Cardiovascular;  Laterality: N/A;  . CARDIAC CATHETERIZATION N/A 11/10/2015   Procedure: Left Heart Cath and Coronary Angiography;  Surgeon: Lennette Bihari, MD;  Location: [facility] INVASIVE CV LAB;  Service: Cardiovascular; patent RCA stents. Ostial LM 30%, ostial LAD 40% - FFR negative. Moderate-severe LV dysfunction with hypo-kinesis involving the anterior wall to the apex. EF suggested 30%.   . CARDIAC CATHETERIZATION N/A 11/10/2015   Procedure: Intravascular Pressure Wire/FFR Study;  Surgeon: Lennette Bihari, MD;  Location: [facility] INVASIVE CV LAB;  Service: Cardiovascular;  FFR of LAD negative.  [facility] Kitchen DILATION AND CURETTAGE OF UTERUS  1979   S/P miscarriage  . INCISION AND DRAINAGE FOOT Right    "spider bite"  . NOSE SURGERY  X 2   "to remove genetic bone spur"  . TRANSTHORACIC ECHOCARDIOGRAM  11/11/2015   Normal LV size and function. Vigorous function with EF 6570%. GR 1 DD. Mild-moderate MR.    Current Medications: Outpatient Medications Prior to Visit  Medication Sig Dispense Refill  . acetaminophen (TYLENOL) 500 MG tablet Take 500 mg by mouth every 6 (six) hours as needed for headache.    . ALPRAZolam (XANAX) 0.25 MG tablet TAKE 1 TABLET TWICE A DAY AS NEEDED FOR ANXIETY  0  . amLODipine (NORVASC) 2.5 MG tablet Take 1 tablet (2.5 mg total) by mouth daily. 90 tablet 3  . atorvastatin (LIPITOR) 40 MG tablet TAKE 1 TABLET (40 MG TOTAL) BY MOUTH DAILY. 90 tablet 3  . cholecalciferol (VITAMIN D) 1000 UNITS tablet Take 1,000 Units by mouth daily.    . clopidogrel (PLAVIX) 75 MG tablet Take 1 tablet (75 mg total) by mouth daily. (Patient taking differently: Take 75 mg by mouth at bedtime. Midnight) 90 tablet 3  . furosemide (LASIX) 20 MG tablet Take 1 tablet by mouth every Monday, Wednesday, Friday 30 tablet 5  . isosorbide mononitrate (IMDUR) 60 MG 24 hr  tablet Take 1 tablet (60 mg total) by mouth daily. (Patient taking differently: Take 60 mg by mouth daily at 12 noon. ) 90 tablet 3  . metoprolol succinate (TOPROL-XL) 25 MG 24 hr tablet TAKE 0.5 TABLETS (12.5 MG TOTAL) BY MOUTH DAILY. 45 tablet 3  . pantoprazole (PROTONIX) 40 MG tablet TAKE 1 TABLET BY MOUTH DAILY AT 12 NOON. 90 tablet 3  . nitroGLYCERIN (NITROSTAT) 0.4 MG SL tablet Place 1 tablet (0.4 mg total) under the tongue every 5 (five) minutes x 3 doses as needed for chest pain. 25 tablet 3  . clobetasol cream (TEMOVATE) 0.05 % Apply 1 application topically 2 (two) times daily.     No facility-administered medications prior to visit.      Allergies:   Cholinesterase inhibitors; Other; Adhesive [tape]; Shellfish-derived products; Epinephrine; and Vancomycin   Social  History   Social History  . Marital status: Widowed    Spouse name: N/A  . Number of children: N/A  . Years of education: N/A   Occupational History  . unemployed    Social History Main Topics  . Smoking status: Former Smoker    Packs/day: 0.10    Years: 2.00    Types: Cigarettes  . Smokeless tobacco: Never Used     Comment: "quit smoking in the 1980s"  . Alcohol use 1.2 oz/week    2 Glasses of wine per week  . Drug use: No  . Sexual activity: Yes    Birth control/ protection: Post-menopausal   Other Topics Concern  . None   Social History Narrative   She is under a significant amount of stress socially.    Her husband committed suicide several years ago - she continues to have difficulty with coping.     Family History:  The patient's family history includes Alzheimer's disease in her father; Breast cancer (age of onset: 50) in her maternal aunt; Heart Problems in her sister; Heart disease in her maternal grandmother; Hyperlipidemia in her brother and mother; Hypotension in her sister; Mental illness in her sister; Peripheral vascular disease in her mother; Schizophrenia in her sister.   Review of  Systems:   Please see the history of present illness.     General:  No chills, fever, night sweats or weight changes.  Cardiovascular:  No dyspnea on exertion, orthopnea, palpitations, paroxysmal nocturnal dyspnea. Positive for chest pain and edema.  Dermatological: No rash, lesions/masses Respiratory: No cough, dyspnea Urologic: No hematuria, dysuria Abdominal:   No nausea, vomiting, diarrhea, bright red blood per rectum, melena, or hematemesis Neurologic:  No visual changes, wkns, changes in mental status. All other systems reviewed and are otherwise negative except as noted above.   Physical Exam:    VS:  BP (!) 148/70   Pulse 64   Ht 5\' 7"  (1.702 m)   Wt 172 lb (78 kg)   BMI 26.94 kg/m    General: Well developed, well nourished Caucasian female appearing in no acute distress. Head: Normocephalic, atraumatic, sclera non-icteric, no xanthomas, nares are without discharge.  Neck: No carotid bruits. JVD not elevated.  Lungs: Respirations regular and unlabored, without wheezes or rales.  Heart: Regular rate and rhythm. No S3 or S4.  No murmur, no rubs, or gallops appreciated. Abdomen: Soft, non-tender, non-distended with normoactive bowel sounds. No hepatomegaly. No rebound/guarding. No obvious abdominal masses. Msk:  Strength and tone appear normal for age. No joint deformities or effusions. Extremities: No clubbing or cyanosis. No lower extremity edema.  Distal pedal pulses are 2+ bilaterally. Neuro: Alert and oriented X 3. Moves all extremities spontaneously. No focal deficits noted. Psych:  Responds to questions appropriately with a normal affect. Skin: No rashes or lesions noted  Wt Readings from Last 3 Encounters:  01/10/17 172 lb (78 kg)  10/06/16 171 lb (77.6 kg)  01/25/16 173 lb (78.5 kg)     Studies/Labs Reviewed:   EKG:  EKG is not ordered today.   Recent Labs: 03/05/2016: BUN 20; Creatinine, Ser 0.98; Hemoglobin 11.0; Platelets 280; Potassium 3.6; Sodium 138    Lipid Panel    Component Value Date/Time   CHOL 149 11/10/2015 0520   TRIG 98 11/10/2015 0520   HDL 73 11/10/2015 0520   CHOLHDL 2.0 11/10/2015 0520   VLDL 20 11/10/2015 0520   LDLCALC 56 11/10/2015 0520    Additional studies/ records that were reviewed  today include:   Cardiac Catheterization: 11/10/2015  Ost LAD lesion, 40% stenosed.  Ost LM lesion, 30% stenosed.  Prox Cx lesion, 20% stenosed.  Mid LAD to Dist LAD lesion, 10% stenosed.  There is moderate to severe left ventricular systolic dysfunction.   Moderately severe LV dysfunction with severe hypo-to akinesis involving the entire anterior anterolateral wall extending to the apex with an ejection fraction of 30%.  Smooth ostial narrowing of the left main 30%; eccentric ostial 40% LAD stenosis seen on only selected views.  Smooth mild 10% mid LAD narrowing; normal small ramus immediate vessel; tortuous left circumflex: Artery with mild 20% proximal narrowing; and widely patent ostial and mid RCA stents.  Normal fractional flow reserve involving flow via the left main and ostial LAD.  Assessment:    1. Coronary artery disease involving native coronary artery of native heart without angina pectoris   2. Medication management   3. Essential hypertension   4. Hyperlipidemia LDL goal <70      Plan:   In order of problems listed above:  1. CAD - s/p STEMI in 2016 with 100% occlusion of RCA with distal embolization (DES x2 placed) with relook cath showing patent stents. Stents were patent by her most recent cath in 2017.  - she denies any chest pain on exertion or dyspnea on exertion. Has experienced one episode of chest pain at the time she was stressed which resolved with Xanax.  - continue Plavix, BB, and statin therapy.   2. HTN - BP was elevated to 160/78 at the time of her last visit and she was started on Amlodipine 2.5mg  daily in addition to being continued on Imdur 60mg  daily and Toprol-XL 12.5mg  daily.   - her BP has improved but remains elevated at 148/70 during today's visit. At 146/72 on recheck. - she does not check her BP regularly at home but says it is in the low-100's when she takes her diuretic and in the 130's at other times. Says it is always elevated in the office as she has white-coat HTN.  - I have asked her to check her BP regularly at home over the next 2 weeks. If SBP remains greater than 140, will increase Amlodipine to 5mg  daily. She will call back with her readings.   3. HLD - Lipid Panel in 11/2015 showed total cholesterol of 149, HDL 73, and LDL 56. At goal with LDL < 70. - will recheck FLP and LFT's today. She wishes to be on a lower dose of Atorvastatin if possible as she reports issues with short-term memory and says this improved with her dose reduction of 80mg  daily to 40mg  daily.    Medication Adjustments/Labs and Tests Ordered: Current medicines are reviewed at length with the patient today.  Concerns regarding medicines are outlined above.  Medication changes, Labs and Tests ordered today are listed in the Patient Instructions below. Patient Instructions  Medication Instructions: Your physician recommends that you continue on your current medications as directed. Please refer to the Current Medication list given to you today.  If you need a refill on your cardiac medications before your next appointment, please call your pharmacy.   Labwork: Your physician recommends that you have a Fasting Lipid and CMET drawn today.   Follow-Up: Your physician wants you to follow-up in: 3 months with Dr. Herbie Baltimore.   Special Instructions:  Please check your blood pressure and keep a log. Let us know if the systolic is greater than 140.   Thank  you for choosing [facility] [employer], Ellsworth Lennox, PA-C  01/10/2017 5:04 PM    [facility] [address] [place], [address] [place], [place]  [address] Phone: [phone]; Fax: [phone]  [address] [place], [place] [address] Phone: [phone]

## 2017-01-10 ENCOUNTER — Encounter: Payer: Self-pay | Admitting: Student

## 2017-01-10 ENCOUNTER — Ambulatory Visit (INDEPENDENT_AMBULATORY_CARE_PROVIDER_SITE_OTHER): Payer: Medicare Other | Admitting: Student

## 2017-01-10 VITALS — BP 148/70 | HR 64 | Ht 67.0 in | Wt 172.0 lb

## 2017-01-10 DIAGNOSIS — Z79899 Other long term (current) drug therapy: Secondary | ICD-10-CM | POA: Diagnosis not present

## 2017-01-10 DIAGNOSIS — E785 Hyperlipidemia, unspecified: Secondary | ICD-10-CM | POA: Diagnosis not present

## 2017-01-10 DIAGNOSIS — I2510 Atherosclerotic heart disease of native coronary artery without angina pectoris: Secondary | ICD-10-CM | POA: Diagnosis not present

## 2017-01-10 DIAGNOSIS — Z9861 Coronary angioplasty status: Secondary | ICD-10-CM

## 2017-01-10 DIAGNOSIS — I10 Essential (primary) hypertension: Secondary | ICD-10-CM | POA: Diagnosis not present

## 2017-01-10 MED ORDER — NITROGLYCERIN 0.4 MG SL SUBL: 0.4000 mg | SUBLINGUAL_TABLET | SUBLINGUAL | 3 refills | Status: DC | PRN

## 2017-01-10 NOTE — Patient Instructions (Signed)
Medication Instructions: Your physician recommends that you continue on your current medications as directed. Please refer to the Current Medication list given to you today.  If you need a refill on your cardiac medications before your next appointment, please call your pharmacy.   Labwork: Your physician recommends that you have a Fasting Lipid and CMET drawn today.   Follow-Up: Your physician wants you to follow-up in: 3 months with Dr. Herbie Baltimore.   Special Instructions:  Please check your blood pressure 3 times a week and keep a log. Let us know if the systolic is greater than 140.   Thank you for choosing [facility] at [facility]!!

## 2017-01-11 LAB — COMPREHENSIVE METABOLIC PANEL
A/G RATIO: 1.9 (ref 1.2–2.2)
ALK PHOS: 110 IU/L (ref 39–117)
ALT: 47 IU/L — ABNORMAL HIGH (ref 0–32)
AST: 37 IU/L (ref 0–40)
Albumin: 4.6 g/dL (ref 3.6–4.8)
BILIRUBIN TOTAL: 0.6 mg/dL (ref 0.0–1.2)
BUN/Creatinine Ratio: 19 (ref 12–28)
BUN: 15 mg/dL (ref 8–27)
CHLORIDE: 107 mmol/L — AB (ref 96–106)
CO2: 20 mmol/L (ref 18–29)
Calcium: 10.1 mg/dL (ref 8.7–10.3)
Creatinine, Ser: 0.81 mg/dL (ref 0.57–1.00)
GFR calc Af Amer: 88 mL/min/{1.73_m2} (ref >59)
GFR calc non Af Amer: 76 mL/min/{1.73_m2} (ref >59)
GLOBULIN, TOTAL: 2.4 g/dL (ref 1.5–4.5)
Glucose: 104 mg/dL — ABNORMAL HIGH (ref 65–99)
POTASSIUM: 4.9 mmol/L (ref 3.5–5.2)
SODIUM: 141 mmol/L (ref 134–144)
Total Protein: 7 g/dL (ref 6.0–8.5)

## 2017-01-11 LAB — LIPID PANEL
CHOLESTEROL TOTAL: 183 mg/dL (ref 100–199)
Chol/HDL Ratio: 2.5 ratio (ref 0.0–4.4)
HDL: 74 mg/dL (ref >39)
LDL CALC: 82 mg/dL (ref 0–99)
Triglycerides: 133 mg/dL (ref 0–149)
VLDL CHOLESTEROL CAL: 27 mg/dL (ref 5–40)

## 2017-01-18 ENCOUNTER — Other Ambulatory Visit: Payer: Self-pay | Admitting: Cardiology

## 2017-01-18 ENCOUNTER — Other Ambulatory Visit: Payer: Self-pay | Admitting: Physician Assistant

## 2017-02-23 ENCOUNTER — Other Ambulatory Visit: Payer: Self-pay | Admitting: Cardiology

## 2017-02-28 ENCOUNTER — Telehealth: Payer: Self-pay | Admitting: Cardiology

## 2017-02-28 DIAGNOSIS — K222 Esophageal obstruction: Secondary | ICD-10-CM | POA: Diagnosis not present

## 2017-02-28 DIAGNOSIS — R1310 Dysphagia, unspecified: Secondary | ICD-10-CM | POA: Diagnosis not present

## 2017-02-28 DIAGNOSIS — I2510 Atherosclerotic heart disease of native coronary artery without angina pectoris: Secondary | ICD-10-CM | POA: Diagnosis not present

## 2017-02-28 NOTE — Telephone Encounter (Signed)
New message       [facility] Pre-operative Risk Assessment    Request for surgical clearance:  1. What type of surgery is being performed? Upper Endoscopy with dialation  2. When is this surgery scheduled? August 9th   3. Are there any medications that need to be held prior to surgery and how long? Plavix 5 days prior to surgery. How long should pt wait to restart?   4. Name of physician performing surgery? Dr. Bosie Clos   5. What is your office phone and fax number? [phone]   Elyse Hsu 02/28/2017, 4:27 PM  _________________________________________________________________   (provider comments below)

## 2017-03-01 NOTE — Telephone Encounter (Signed)
Okay to hold Plavix 5 days pre-op. May restart 2-3 days postop depending on bleeding concern.  Otherwise low risk  [name], MD

## 2017-03-01 NOTE — Telephone Encounter (Signed)
Clearance routed via EPIC 

## 2017-03-08 DIAGNOSIS — R319 Hematuria, unspecified: Secondary | ICD-10-CM | POA: Diagnosis not present

## 2017-03-08 DIAGNOSIS — N390 Urinary tract infection, site not specified: Secondary | ICD-10-CM | POA: Diagnosis not present

## 2017-04-12 ENCOUNTER — Ambulatory Visit (INDEPENDENT_AMBULATORY_CARE_PROVIDER_SITE_OTHER): Payer: Medicare Other | Admitting: Cardiology

## 2017-04-12 ENCOUNTER — Encounter: Payer: Self-pay | Admitting: Cardiology

## 2017-04-12 VITALS — BP 146/76 | HR 68 | Ht 67.0 in | Wt 168.0 lb

## 2017-04-12 DIAGNOSIS — I25111 Atherosclerotic heart disease of native coronary artery with angina pectoris with documented spasm: Secondary | ICD-10-CM

## 2017-04-12 DIAGNOSIS — I2510 Atherosclerotic heart disease of native coronary artery without angina pectoris: Secondary | ICD-10-CM

## 2017-04-12 DIAGNOSIS — I10 Essential (primary) hypertension: Secondary | ICD-10-CM

## 2017-04-12 DIAGNOSIS — I209 Angina pectoris, unspecified: Secondary | ICD-10-CM

## 2017-04-12 DIAGNOSIS — Z9861 Coronary angioplasty status: Secondary | ICD-10-CM

## 2017-04-12 DIAGNOSIS — E785 Hyperlipidemia, unspecified: Secondary | ICD-10-CM | POA: Diagnosis not present

## 2017-04-12 DIAGNOSIS — R0989 Other specified symptoms and signs involving the circulatory and respiratory systems: Secondary | ICD-10-CM | POA: Diagnosis not present

## 2017-04-12 DIAGNOSIS — R002 Palpitations: Secondary | ICD-10-CM

## 2017-04-12 MED ORDER — AMLODIPINE BESYLATE 5 MG PO TABS: 5.0000 mg | ORAL_TABLET | Freq: Every day | ORAL | 3 refills | Status: DC

## 2017-04-12 NOTE — Patient Instructions (Signed)
SCHEDULE AT [address] Your physician has requested that you have a carotid duplex. This test is an ultrasound of the carotid arteries in your neck. It looks at blood flow through these arteries that supply the brain with blood. Allow one hour for this exam. There are no restrictions or special instructions.  LABS IN DEC 2018 LIPID CMP WILL MAIL YOU THE LAB SLIP TO YOU IN NOV 2018     Your physician recommends that you schedule a follow-up appointment in 6 MONTHS WITH B. STRADER PA   Your physician wants you to follow-up in 12 MONTHS WITH DR HARDING. You will receive a reminder letter in the mail two months in advance. If you don't receive a letter, please call our office to schedule the follow-up appointment.  If you need a refill on your cardiac medications before your next appointment, please call your pharmacy.

## 2017-04-12 NOTE — Progress Notes (Signed)
PCP: Darrow Bussing, MD  Clinic Note: Chief Complaint  Patient presents with  . Follow-up    discuss blood pressure   . Coronary Artery Disease    PCI of the RCA in setting of STEMI; FFR negative lesion in the LAD    HPI: Laura Herrera is a 66 y.o. female with a PMH below who presents today for 3 month follow-up for CAD.   CAD (s/p STEMI in 2016 with 100% occlusion of RCA with distal embolization (DES x2 placed) with relook cath showing patent stents, patent by cath in 2017),   HTN, HLD  Laura Herrera was last seen on 01/10/2017 by [place] - reported an episode of chest discomfort during a stressful situation. She took Xanax and felt better. She has had intermittent dizziness usually following taking a diuretic. Her blood pressures usually low dose times.  Recent Hospitalizations:   03/05/2016 - chest pain, evaluated by cath  Studies Personally Reviewed - (if available, images/films reviewed: From Epic Chart or Care Everywhere)  None  Interval History: He returns today for follow-up more concerned with her blood pressures going up and down the near the house. She has noted that her swelling is doing better with the exception of when she was down to the beach and spent a lot of time walking on the beach and having wine. Then her swelling got worse but otherwise when she came home. Her feet up or swelling of better. Neck sized to short spells of chest pain In the morning and it usually occurs after she's had a bad night of nightmares but the symptoms go away after a few minutes and don't recur during the course the day and don't occur with any particular activity. She thinks that this may be akin to her anxiety and is trying to cut back, metastatic she is taking them. She indicates is really taking rare doses of Xanax. Besides these 2 episodes, she is not noticing any resting or exertional chest tightness or pressure. No resting or exertional dyspnea either. She is  not having any PND, orthopnea to go along with a mild edema. No rapid irregular heartbeats or palpitations. No lightheadedness or dizziness, wooziness, syncope/near-syncope or TIA/amaurosis fugax   No claudication.  ROS: A comprehensive was performed. Review of Systems  Constitutional: Positive for weight loss (She thinks she is losing weight because she had been heavier than was recorded and is continuing to be lower).  HENT: Negative for congestion and nosebleeds.   Respiratory: Negative for cough, sputum production and wheezing.        Per history of present illness  Cardiovascular: Positive for leg swelling (Doing better).  Gastrointestinal: Negative for blood in stool and melena.  Genitourinary: Negative for hematuria.  Musculoskeletal: Negative for falls, joint pain and myalgias (More related to cramping).  Skin: Negative.   Neurological: Positive for dizziness (Sometimes with different changes in position).  Endo/Heme/Allergies: Positive for environmental allergies. Does not bruise/bleed easily.  Psychiatric/Behavioral: Negative for depression and memory loss. The patient is nervous/anxious (Trying to cut back on Xanax dosing).        Still having nightmares  All other systems reviewed and are negative.  I have reviewed and (if needed) personally updated the patient's problem list, medications, allergies, past medical and surgical history, social and family history.   Past Medical History:  Diagnosis Date  . Anxiety   . Chest pain with high risk for cardiac etiology 11/19/2015   Cardiac catheterization showed stable  coronaries with patent RCA stents. FFR of LAD negative. --> LV gram EF suggested Takotsubo versus LAD spasm.  . Complication of anesthesia    pseudo cholinesterace deficiency  . Coronary artery disease    a. 01/2015 Inf STEMI/PCI: RCA 80ost (2.75x12 Promus Premier DES), 100p (2.5x24 Promus Premier DES), 70p/m (2.5x24 Promus Premier DES), 90d (PTCA);  b. 01/2015 Relook  Cath: LM nl, LAD 50m, D1 min irregs, LCX nl- L->R collats,  OM1 mod dzs, OM2 mild dizs, RCA 10ost, heavy thrombus p/m, 10d->Aggrastat;  c. 01/2015 Echo: EF 60-65%, no rwma, mod AI, PASP . d. 11/2015: cath showing patent stents   . Depression   . Dyslipidemia   . Headache    "used to have them daily; I no longer do" (11/09/2015)  . Hypertension   . Insomnia   . Kidney stones    "years ago; passed it"  . MRSA infection    h/o MRSA infection in the right foot  . Myocardial infarction (HCC) 01/2015  . NSTEMI (non-ST elevated myocardial infarction) (HCC) 11/09/2015  . PONV (postoperative nausea and vomiting)   . Pseudocholinesterase deficiency   . Psoriasis    hand and foot (11/09/2015)    Past Surgical History:  Procedure Laterality Date  . ANGIOPLASTY  01/12/2015   Procedure: Angioplasty;  Surgeon: Lyn Records, MD;  Location: [facility] INVASIVE CV LAB;  Service: Cardiovascular;;  . APPENDECTOMY    . CARDIAC CATHETERIZATION N/A 01/11/2015   Procedure: Left Heart Cath and Coronary Angiography;  Surgeon: Marykay Lex, MD;  Location: [facility] INVASIVE CV LAB;  Service: Cardiovascular;  Laterality: N/A;  . CARDIAC CATHETERIZATION  01/11/2015   Procedure: Coronary Stent Intervention;  Surgeon: Marykay Lex, MD;  Location: [facility] INVASIVE CV LAB;  Service: Cardiovascular;;  . CARDIAC CATHETERIZATION  01/11/2015   Procedure: Coronary Balloon Angioplasty;  Surgeon: Marykay Lex, MD;  Location: [facility] INVASIVE CV LAB;  Service: Cardiovascular;;  . CARDIAC CATHETERIZATION N/A 01/12/2015   Procedure: Left Heart Cath and Coronary Angiography;  Surgeon: Lyn Records, MD;  Location: [facility] INVASIVE CV LAB;  Service: Cardiovascular;  Laterality: N/A;  . CARDIAC CATHETERIZATION N/A 11/10/2015   Procedure: Left Heart Cath and Coronary Angiography;  Surgeon: Lennette Bihari, MD;  Location: [facility] INVASIVE CV LAB;  Service: Cardiovascular; patent RCA stents. Ostial LM 30%, ostial LAD 40% - FFR negative. Moderate-severe LV dysfunction  with hypo-kinesis involving the anterior wall to the apex. EF suggested 30%.   . CARDIAC CATHETERIZATION N/A 11/10/2015   Procedure: Intravascular Pressure Wire/FFR Study;  Surgeon: Lennette Bihari, MD;  Location: [facility] INVASIVE CV LAB;  Service: Cardiovascular;  FFR of LAD negative.  Marland Kitchen DILATION AND CURETTAGE OF UTERUS  1979   S/P miscarriage  . INCISION AND DRAINAGE FOOT Right    "spider bite"  . NOSE SURGERY  X 2   "to remove genetic bone spur"  . TRANSTHORACIC ECHOCARDIOGRAM  11/11/2015   Normal LV size and function. Vigorous function with EF 6570%. GR 1 DD. Mild-moderate MR.   Diagnostic Angiogram April 2017      Current Meds  Medication Sig  . acetaminophen (TYLENOL) 500 MG tablet Take 500 mg by mouth every 6 (six) hours as needed for headache.  . ALPRAZolam (XANAX) 0.25 MG tablet TAKE 1 TABLET TWICE A DAY AS NEEDED FOR ANXIETY  . atorvastatin (LIPITOR) 40 MG tablet TAKE 1 TABLET (40 MG TOTAL) BY MOUTH DAILY.  . cholecalciferol (VITAMIN D) 1000 UNITS tablet Take 1,000 Units by mouth daily.  Marland Kitchen  clopidogrel (PLAVIX) 75 MG tablet TAKE 1 TABLET (75 MG TOTAL) BY MOUTH DAILY.  . furosemide (LASIX) 20 MG tablet TAKE 1 TABLET BY MOUTH EVERY MONDAY, WEDNESDAY, FRIDAY  . isosorbide mononitrate (IMDUR) 60 MG 24 hr tablet TAKE 1 TABLET (60 MG TOTAL) BY MOUTH DAILY.  . metoprolol succinate (TOPROL-XL) 25 MG 24 hr tablet TAKE 0.5 TABLETS (12.5 MG TOTAL) BY MOUTH DAILY.  . nitroGLYCERIN (NITROSTAT) 0.4 MG SL tablet Place 1 tablet (0.4 mg total) under the tongue every 5 (five) minutes x 3 doses as needed for chest pain.  . pantoprazole (PROTONIX) 40 MG tablet TAKE 1 TABLET BY MOUTH DAILY AT 12 NOON.  . [DISCONTINUED] amLODipine (NORVASC) 2.5 MG tablet Take 1 tablet (2.5 mg total) by mouth daily.    Allergies  Allergen Reactions  . 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  . Other Other (See Comments)    Sensitive  to the electrodes  . Adhesive [Tape] Other (See Comments)    Tears skin off, and "paper" tape also  . Shellfish-Derived Products Swelling    Crabs, lobster, crayfish; tissues in joints swelled  . Epinephrine Palpitations    Palpitations and involuntary body jerking  . Vancomycin Hives and Rash    severe     Social History   Social History  . Marital status: Widowed    Spouse name: N/A  . Number of children: N/A  . Years of education: N/A   Occupational History  . unemployed    Social History Main Topics  . Smoking status: Former Smoker    Packs/day: 0.10    Years: 2.00    Types: Cigarettes  . Smokeless tobacco: Never Used     Comment: "quit smoking in the 1980s"  . Alcohol use 1.2 oz/week    2 Glasses of wine per week  . Drug use: No  . Sexual activity: Yes    Birth control/ protection: Post-menopausal   Other Topics Concern  . None   Social History Narrative   She is under a significant amount of stress socially.    Her husband committed suicide several years ago - she continues to have difficulty with coping.    family history includes Alzheimer's disease in her father; Breast cancer (age of onset: 44) in her maternal aunt; Heart Problems in her sister; Heart disease in her maternal grandmother; Hyperlipidemia in her brother and mother; Hypotension in her sister; Mental illness in her sister; Peripheral vascular disease in her mother; Schizophrenia in her sister.  Wt Readings from Last 3 Encounters:  04/12/17 168 lb (76.2 kg)  01/10/17 172 lb (78 kg)  10/06/16 171 lb (77.6 kg)    PHYSICAL EXAM BP (!) 146/76   Pulse 68   Ht  (1.702 m)   Wt 168 lb (76.2 kg)   BMI 26.31 kg/m  Physical Exam  Constitutional: She is oriented to person, place, and time. She appears well-developed and well-nourished. No distress.  Well-groomed. Well-dressed.  HENT:  Head: Normocephalic and atraumatic.  Eyes: No scleral icterus.  Neck: Trachea normal and normal range of  motion. Neck supple. No hepatojugular reflux and no JVD present. Carotid bruit is present (Left-sided). No thyromegaly present.  Cardiovascular: Normal rate, regular rhythm, normal heart sounds and intact distal pulses.  Exam reveals no gallop and no friction rub.   No murmur heard. Pulmonary/Chest: Effort normal and breath sounds normal. No respiratory distress. She has no wheezes. She has  no rales.  Abdominal: Soft. Bowel sounds are normal. She exhibits no distension. There is no tenderness. There is no rebound.  Musculoskeletal: Normal range of motion. She exhibits no edema (Trivial puffy swelling).  Neurological: She is alert and oriented to person, place, and time.  Skin: Skin is warm and dry. No erythema.  Psychiatric: She has a normal mood and affect. Her behavior is normal. Judgment and thought content normal.  Nursing note and vitals reviewed.   Adult ECG Report None  Other studies Reviewed: Additional studies/ records that were reviewed today include:  Recent Labs:   Lab Results  Component Value Date   CHOL 183 01/10/2017   HDL 74 01/10/2017   LDLCALC 82 01/10/2017   TRIG 133 01/10/2017   CHOLHDL 2.5 01/10/2017   Lab Results  Component Value Date   CREATININE 0.81 01/10/2017   BUN 15 01/10/2017   NA 141 01/10/2017   K 4.9 01/10/2017   CL 107 (H) 01/10/2017   CO2 20 01/10/2017   ASSESSMENT / PLAN: Problem List Items Addressed This Visit    CAD S/P PCI of the ostial and mid/distal RCA - Primary (Chronic)    Very tortuous RCA as noted in the diagram - she also had the ostial lesion which is very difficult to treat and therefore to reengage. Her last relook cath showed that the RCA was looking pretty good.  Because of the extent of disease in the RCA with difficulty stenting, I would keep her on lifelong Plavix. She is on beta blocker and statin. We have amlodipine for antianginal effect as it was likely some possible spasm.      Relevant Medications   amLODipine  (NORVASC) 5 MG tablet   Other Relevant Orders   Lipid panel   Comprehensive metabolic panel   Coronary artery disease with angina pectoris with documented spasm (HCC) (Chronic)    She had recurrent chest pain following PCI and therefore we added amlodipine for spasm. That seems to have helped. She was on Ranexa as per time being is no longer on it now. She is simply on Imdur and Toprol and amlodipine.      Relevant Medications   amLODipine (NORVASC) 5 MG tablet   Essential hypertension (Chronic)    Blood pressure is low but high today. She tells me usually at home it's much better than reported today. Usually in the 120/70 range. Because she says is better control at home, we will simply continue current meds, however if her pressures continued to be in the 140s here with increasing amlodipine to 5 mg.      Relevant Medications   amLODipine (NORVASC) 5 MG tablet   Heart palpitations (Chronic)    Stable with current dose of beta blocker.      Hyperlipidemia with target LDL less than 70 (Chronic)    Lipids show that the LDL is still not quite at goal on Lipitor. She is hoping to make some adjustments to her diet and get back in exercising. She was to see what her lipids would like Because she thinks that they'll be better. If not at goal then, we may need to consider either switching from atorvastatin to Crestor or adding Zetia.      Relevant Medications   amLODipine (NORVASC) 5 MG tablet   Other Relevant Orders   Lipid panel   Comprehensive metabolic panel   Left carotid bruit (Chronic)    She has had carotid Dopplers done in the past, but I know I  am clearly getting a left carotid bruit. We will reorder carotid Dopplers to ensure that is no progression of disease.         Current medicines are reviewed at length with the patient today. (+/- concerns) None The following changes have been made: None  Patient Instructions  SCHEDULE AT [address] Your  physician has requested that you have a carotid duplex. This test is an ultrasound of the carotid arteries in your neck. It looks at blood flow through these arteries that supply the brain with blood. Allow one hour for this exam. There are no restrictions or special instructions.  LABS IN DEC 2018 LIPID CMP WILL MAIL YOU THE LAB SLIP TO YOU IN NOV 2018     Your physician recommends that you schedule a follow-up appointment in 6 MONTHS WITH B. STRADER PA   Your physician wants you to follow-up in 12 MONTHS WITH DR Larraine Argo. You will receive a reminder letter in the mail two months in advance. If you don't receive a letter, please call our office to schedule the follow-up appointment.  If you need a refill on your cardiac medications before your next appointment, please call your pharmacy.    Studies Ordered:   Orders Placed This Encounter  Procedures  . Lipid panel  . Comprehensive metabolic panel      [name], M.D., M.S. Interventional Cardiologist   Pager # [phone] Phone # [phone] [address] [place], [place] [address]

## 2017-04-13 ENCOUNTER — Other Ambulatory Visit: Payer: Self-pay | Admitting: Cardiology

## 2017-04-13 DIAGNOSIS — R0989 Other specified symptoms and signs involving the circulatory and respiratory systems: Secondary | ICD-10-CM

## 2017-04-15 ENCOUNTER — Encounter: Payer: Self-pay | Admitting: Cardiology

## 2017-04-15 NOTE — Assessment & Plan Note (Signed)
Blood pressure is low but high today. She tells me usually at home it's much better than reported today. Usually in the 120/70 range. Because she says is better control at home, we will simply continue current meds, however if her pressures continued to be in the 140s here with increasing amlodipine to 5 mg.

## 2017-04-15 NOTE — Assessment & Plan Note (Signed)
She has had carotid Dopplers done in the past, but I know I am clearly getting a left carotid bruit. We will reorder carotid Dopplers to ensure that is no progression of disease.

## 2017-04-15 NOTE — Assessment & Plan Note (Signed)
Very tortuous RCA as noted in the diagram - she also had the ostial lesion which is very difficult to treat and therefore to reengage. Her last relook cath showed that the RCA was looking pretty good.  Because of the extent of disease in the RCA with difficulty stenting, I would keep her on lifelong Plavix. She is on beta blocker and statin. We have amlodipine for antianginal effect as it was likely some possible spasm.

## 2017-04-15 NOTE — Assessment & Plan Note (Signed)
Lipids show that the LDL is still not quite at goal on Lipitor. She is hoping to make some adjustments to her diet and get back in exercising. She was to see what her lipids would like Because she thinks that they'll be better. If not at goal then, we may need to consider either switching from atorvastatin to Crestor or adding Zetia.

## 2017-04-15 NOTE — Assessment & Plan Note (Signed)
Stable with current dose of beta blocker. 

## 2017-04-15 NOTE — Assessment & Plan Note (Signed)
She had recurrent chest pain following PCI and therefore we added amlodipine for spasm. That seems to have helped. She was on Ranexa as per time being is no longer on it now. She is simply on Imdur and Toprol and amlodipine.

## 2017-04-24 ENCOUNTER — Ambulatory Visit (HOSPITAL_COMMUNITY)
Admission: RE | Admit: 2017-04-24 | Discharge: 2017-04-24 | Disposition: A | Discharge status: Home / Self Care | Payer: Medicare Other | Source: Ambulatory Visit | Attending: Cardiovascular Disease | Admitting: Cardiovascular Disease

## 2017-04-24 DIAGNOSIS — Z87891 Personal history of nicotine dependence: Secondary | ICD-10-CM | POA: Insufficient documentation

## 2017-04-24 DIAGNOSIS — E785 Hyperlipidemia, unspecified: Secondary | ICD-10-CM | POA: Insufficient documentation

## 2017-04-24 DIAGNOSIS — R0989 Other specified symptoms and signs involving the circulatory and respiratory systems: Secondary | ICD-10-CM | POA: Diagnosis present

## 2017-04-24 DIAGNOSIS — I2510 Atherosclerotic heart disease of native coronary artery without angina pectoris: Secondary | ICD-10-CM | POA: Insufficient documentation

## 2017-04-24 DIAGNOSIS — I6523 Occlusion and stenosis of bilateral carotid arteries: Secondary | ICD-10-CM | POA: Diagnosis not present

## 2017-04-24 DIAGNOSIS — I10 Essential (primary) hypertension: Secondary | ICD-10-CM | POA: Insufficient documentation

## 2017-04-27 DIAGNOSIS — I771 Stricture of artery: Secondary | ICD-10-CM | POA: Insufficient documentation

## 2017-05-01 DIAGNOSIS — H40013 Open angle with borderline findings, low risk, bilateral: Secondary | ICD-10-CM | POA: Diagnosis not present

## 2017-05-03 IMAGING — CR DG CHEST 2V
2 series · 2 of 2 positions shown · non-contrast
Comparison: 01/20/2015

CLINICAL DATA: Onset of shortness of breath, LEFT arm pain and
numbness with pain radiating to chest while driving today, history
coronary artery disease post MI and coronary stenting, hypertension,
dyslipidemia, former smoker

EXAM:
CHEST  2 VIEW

[chest pa]
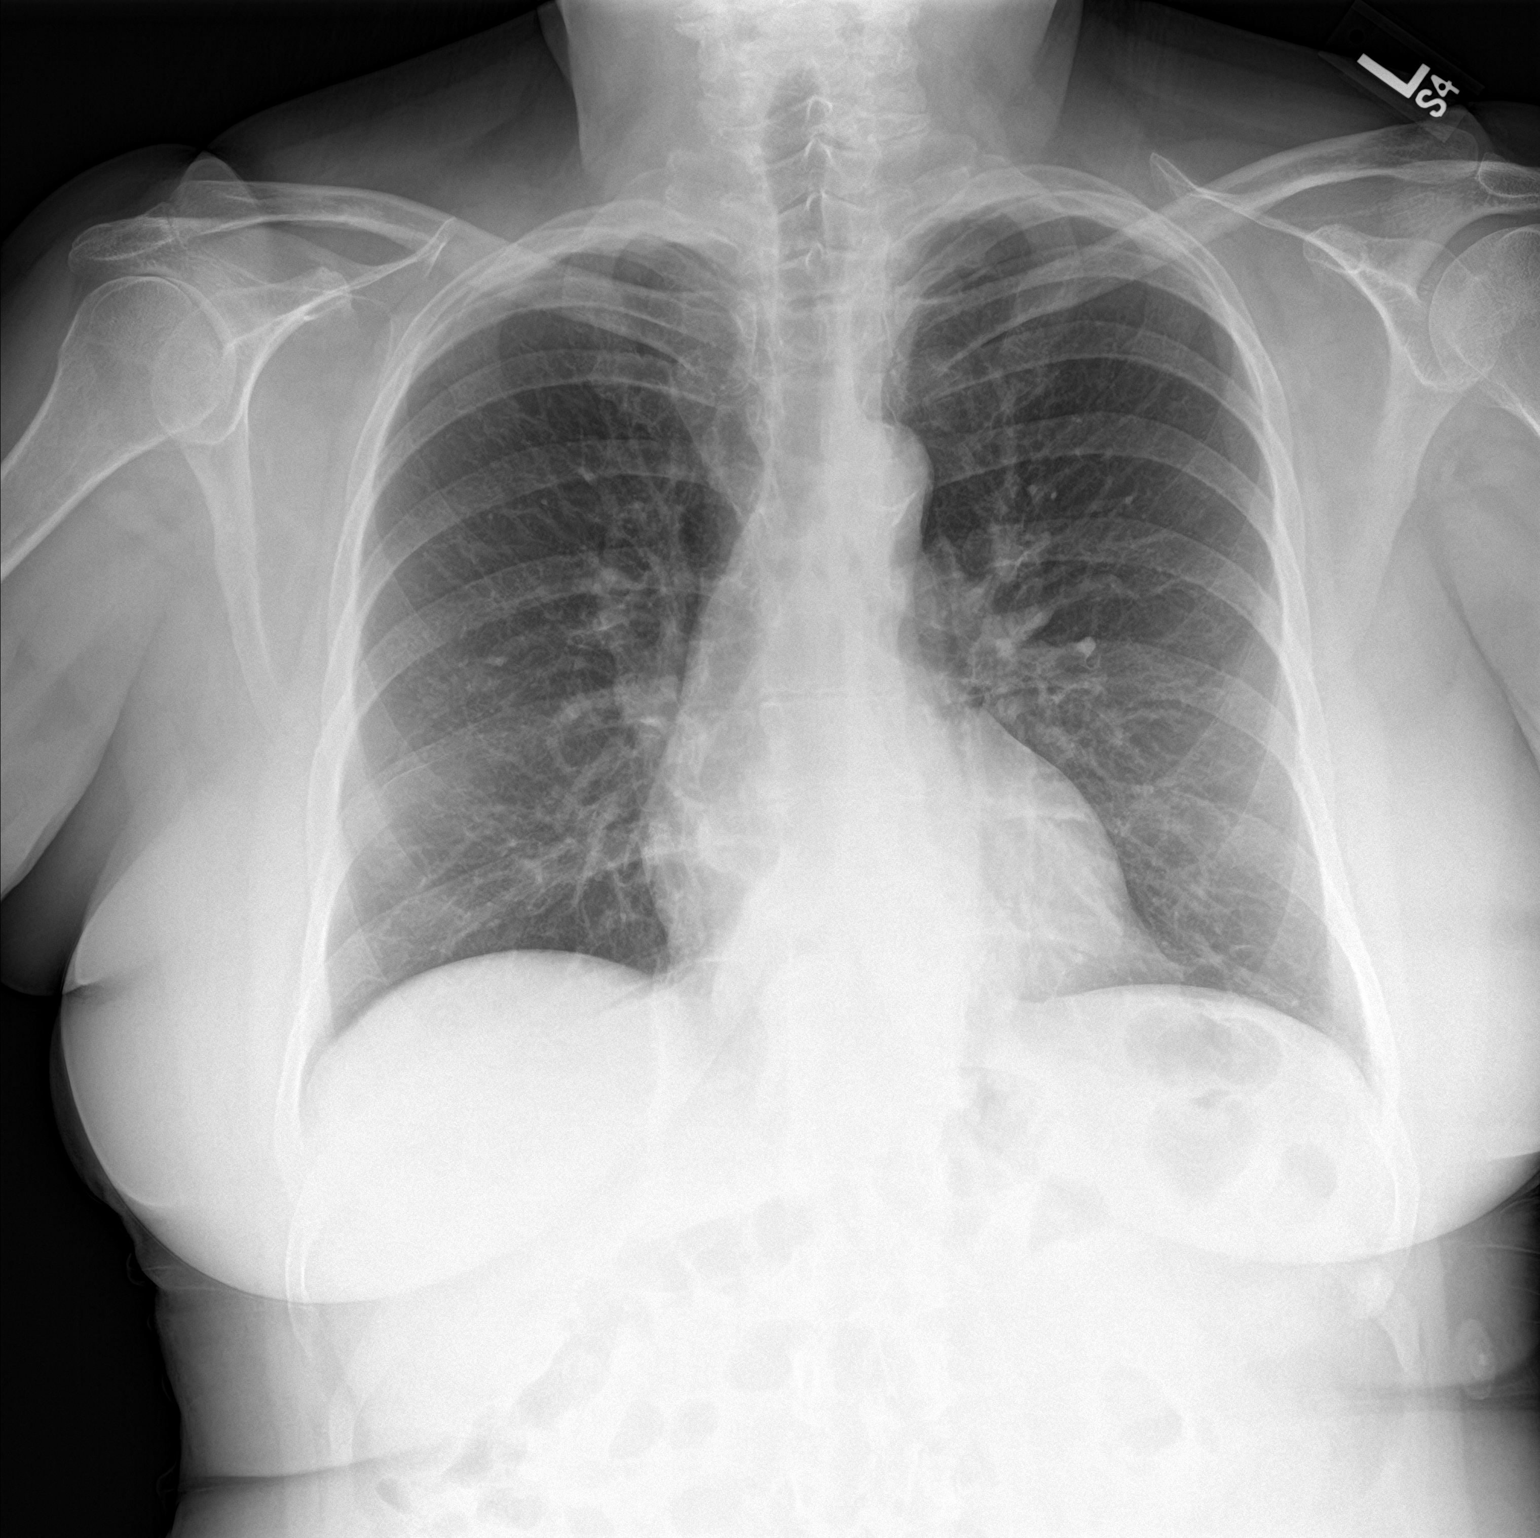

[chest lat]
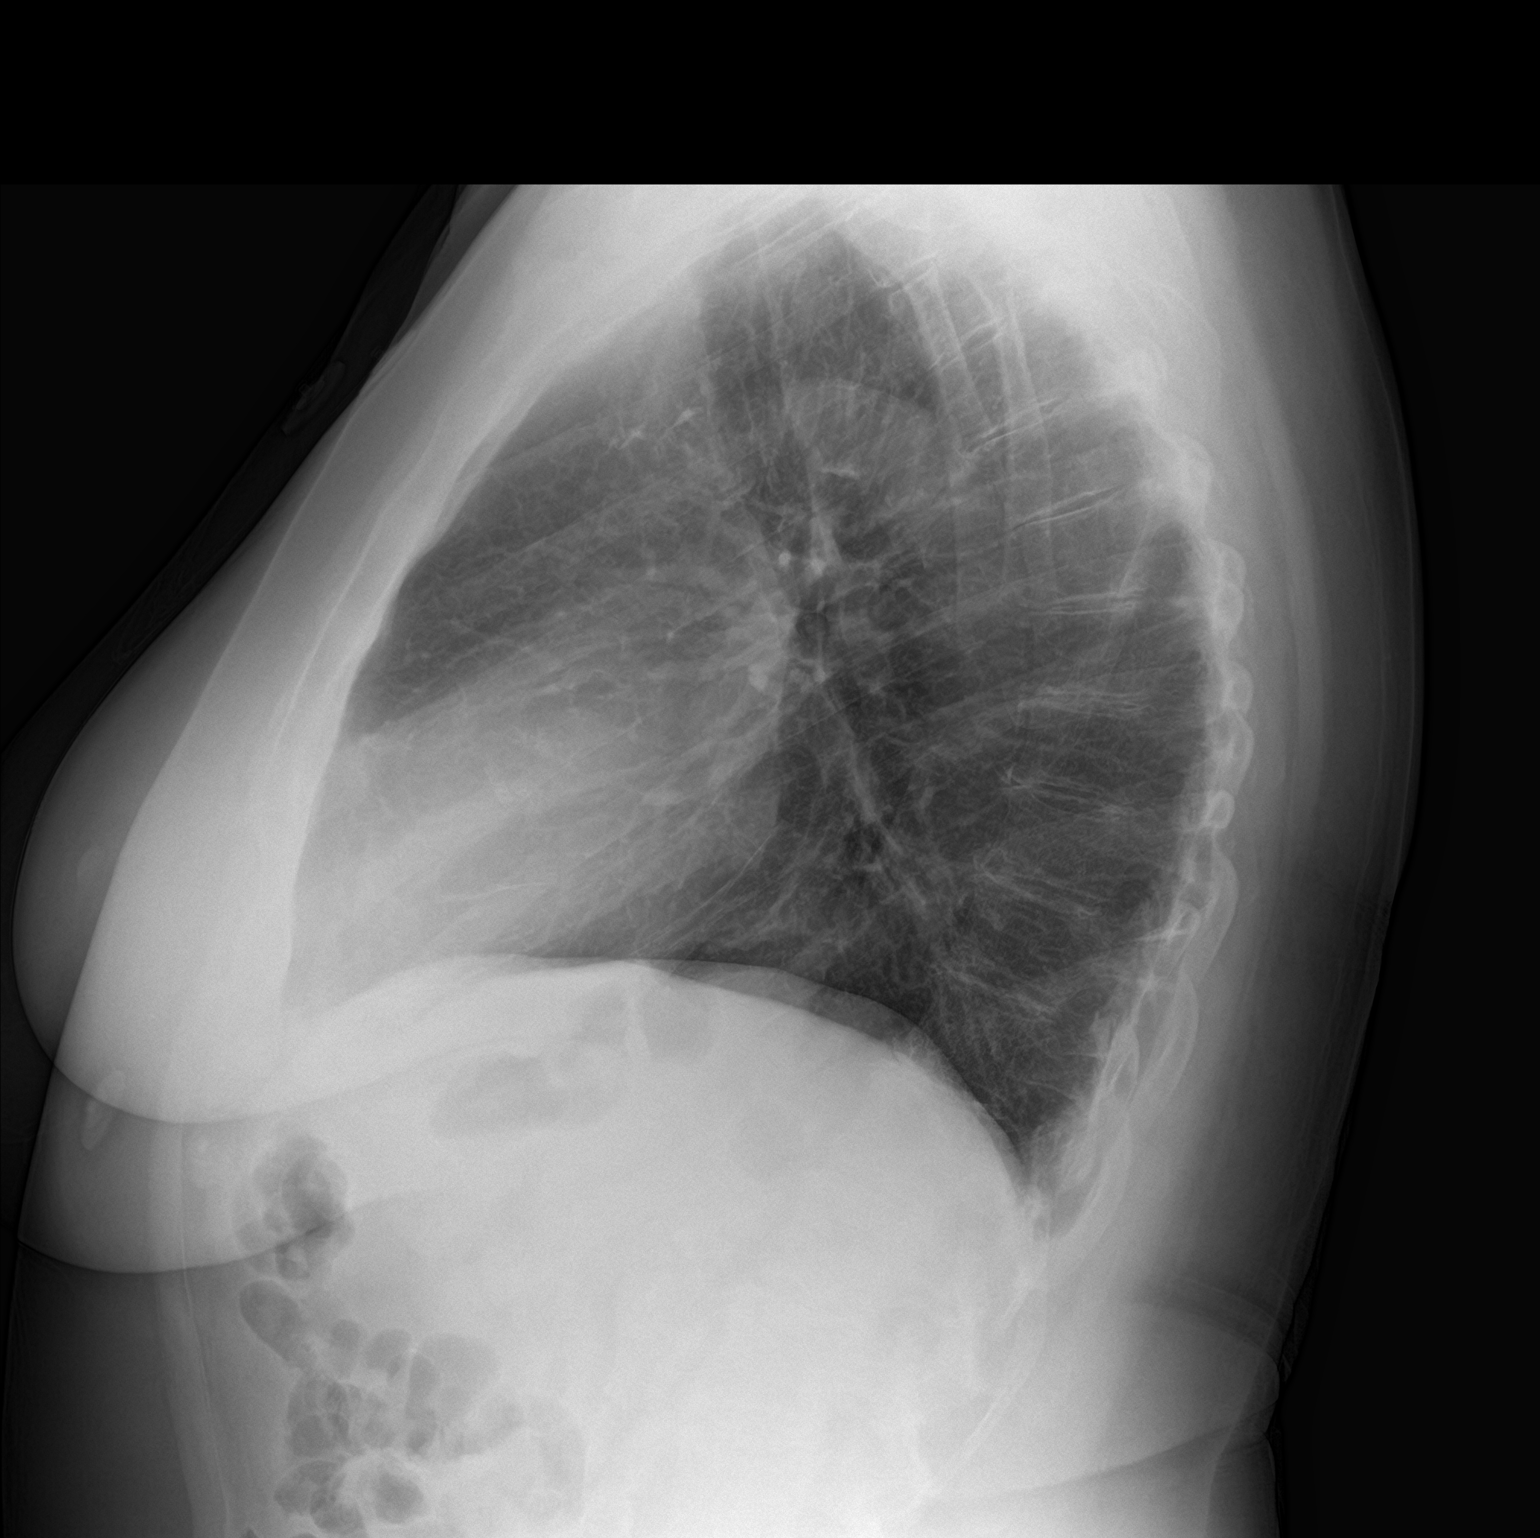

[2 of 2 positions shown; findings below may reference images not displayed]

FINDINGS: Normal heart size and pulmonary vascularity.

Atherosclerotic calcification aorta.

Small hiatal hernia again seen.

Minimal peribronchial thickening without infiltrate, pleural
effusion or pneumothorax.

Bones demineralized.
IMPRESSION: Small hiatal hernia.

Minimal bronchitic changes without infiltrate.

## 2017-06-06 DIAGNOSIS — H2512 Age-related nuclear cataract, left eye: Secondary | ICD-10-CM | POA: Diagnosis not present

## 2017-06-06 DIAGNOSIS — H25042 Posterior subcapsular polar age-related cataract, left eye: Secondary | ICD-10-CM | POA: Diagnosis not present

## 2017-06-06 DIAGNOSIS — H40013 Open angle with borderline findings, low risk, bilateral: Secondary | ICD-10-CM | POA: Diagnosis not present

## 2017-06-06 DIAGNOSIS — H5213 Myopia, bilateral: Secondary | ICD-10-CM | POA: Diagnosis not present

## 2017-06-07 DIAGNOSIS — Z124 Encounter for screening for malignant neoplasm of cervix: Secondary | ICD-10-CM | POA: Diagnosis not present

## 2017-07-12 DIAGNOSIS — H25812 Combined forms of age-related cataract, left eye: Secondary | ICD-10-CM | POA: Diagnosis not present

## 2017-07-12 DIAGNOSIS — H25042 Posterior subcapsular polar age-related cataract, left eye: Secondary | ICD-10-CM | POA: Diagnosis not present

## 2017-07-12 DIAGNOSIS — H2512 Age-related nuclear cataract, left eye: Secondary | ICD-10-CM | POA: Diagnosis not present

## 2017-09-09 DIAGNOSIS — N3091 Cystitis, unspecified with hematuria: Secondary | ICD-10-CM | POA: Diagnosis not present

## 2017-10-03 DIAGNOSIS — H1032 Unspecified acute conjunctivitis, left eye: Secondary | ICD-10-CM | POA: Diagnosis not present

## 2017-10-03 DIAGNOSIS — H5712 Ocular pain, left eye: Secondary | ICD-10-CM | POA: Diagnosis not present

## 2017-10-05 ENCOUNTER — Other Ambulatory Visit: Payer: Self-pay | Admitting: Cardiology

## 2017-11-21 DIAGNOSIS — I2510 Atherosclerotic heart disease of native coronary artery without angina pectoris: Secondary | ICD-10-CM | POA: Diagnosis not present

## 2017-11-21 DIAGNOSIS — F419 Anxiety disorder, unspecified: Secondary | ICD-10-CM | POA: Diagnosis not present

## 2017-11-21 DIAGNOSIS — E7800 Pure hypercholesterolemia, unspecified: Secondary | ICD-10-CM | POA: Diagnosis not present

## 2017-11-21 DIAGNOSIS — Z79899 Other long term (current) drug therapy: Secondary | ICD-10-CM | POA: Diagnosis not present

## 2017-11-21 DIAGNOSIS — Z0000 Encounter for general adult medical examination without abnormal findings: Secondary | ICD-10-CM | POA: Diagnosis not present

## 2017-11-21 DIAGNOSIS — I10 Essential (primary) hypertension: Secondary | ICD-10-CM | POA: Diagnosis not present

## 2017-11-30 DIAGNOSIS — L0390 Cellulitis, unspecified: Secondary | ICD-10-CM | POA: Diagnosis not present

## 2017-12-11 ENCOUNTER — Other Ambulatory Visit: Payer: Self-pay | Admitting: Obstetrics & Gynecology

## 2017-12-11 DIAGNOSIS — Z1231 Encounter for screening mammogram for malignant neoplasm of breast: Secondary | ICD-10-CM

## 2017-12-21 DIAGNOSIS — L0390 Cellulitis, unspecified: Secondary | ICD-10-CM | POA: Diagnosis not present

## 2017-12-31 ENCOUNTER — Other Ambulatory Visit: Payer: Self-pay | Admitting: Cardiology

## 2018-01-03 ENCOUNTER — Ambulatory Visit
Admission: RE | Admit: 2018-01-03 | Discharge: 2018-01-03 | Disposition: A | Discharge status: Home / Self Care | Payer: Medicare Other | Source: Ambulatory Visit | Attending: Obstetrics & Gynecology | Admitting: Obstetrics & Gynecology

## 2018-01-03 DIAGNOSIS — Z1231 Encounter for screening mammogram for malignant neoplasm of breast: Secondary | ICD-10-CM

## 2018-01-07 DIAGNOSIS — R740 Nonspecific elevation of levels of transaminase and lactic acid dehydrogenase [LDH]: Secondary | ICD-10-CM | POA: Diagnosis not present

## 2018-01-22 ENCOUNTER — Other Ambulatory Visit: Payer: Self-pay | Admitting: Cardiovascular Disease

## 2018-02-12 DIAGNOSIS — H40013 Open angle with borderline findings, low risk, bilateral: Secondary | ICD-10-CM | POA: Diagnosis not present

## 2018-02-19 DIAGNOSIS — N898 Other specified noninflammatory disorders of vagina: Secondary | ICD-10-CM | POA: Diagnosis not present

## 2018-02-19 DIAGNOSIS — N760 Acute vaginitis: Secondary | ICD-10-CM | POA: Diagnosis not present

## 2018-03-03 DIAGNOSIS — L0390 Cellulitis, unspecified: Secondary | ICD-10-CM | POA: Diagnosis not present

## 2018-03-11 ENCOUNTER — Telehealth: Payer: Self-pay | Admitting: Cardiology

## 2018-03-11 NOTE — Telephone Encounter (Signed)
New Message    Pt c/o medication issue:  1. Name of Medication: amlodipine, atorvastatin, plavix, lasix  2. How are you currently taking this medication (dosage and times per day)?   3. Are you having a reaction (difficulty breathing--STAT)?   4. What is your medication issue? Patient is calling because she has lost some weight. She wants to be sure that she is taking the correct medication that is appropriate for her weight. Please call.

## 2018-03-11 NOTE — Telephone Encounter (Signed)
New Message °  °  °  °Pt c/o medication issue: °  °1. Name of Medication: amlodipine, atorvastatin, plavix, lasix °  °2. How are you currently taking this medication (dosage and times per day)?  °  °3. Are you having a reaction (difficulty breathing--STAT)?  °  °4. What is your medication issue? Patient is calling because she has lost some weight. She wants to be sure that she is taking the correct medication that is appropriate for her weight. Please call.  ° °

## 2018-03-11 NOTE — Telephone Encounter (Signed)
I would suggest that she take her BP 3 x per day at home for the next week and she could be seen by a APP to follow up.

## 2018-03-11 NOTE — Telephone Encounter (Signed)
Spoke with pt who states she had a couple of drinks on Friday night and had a fall that caused her to hit her head and black both eyes. She is call to inquire if the fall could be related to the drinks or due to the medication since she lost over 30 lbs since last OV. Pt informed that most medication are dosed based on BP, lab work, and HR and it possible the fall could be related to the drinks. Pt also informed to notify pcp for further evaluation but will route to Dr. Herbie Baltimore for his recommendation.

## 2018-03-12 NOTE — Telephone Encounter (Signed)
Left message to call back  

## 2018-03-12 NOTE — Telephone Encounter (Signed)
Follow up  ° ° °Patient is returning call.  °

## 2018-03-12 NOTE — Telephone Encounter (Signed)
Spoke with pt and advised of Dr. Jenene Slicker recommendation. Verbalized understanding. Appointment made for 03/18/18 at 1130 with [name], DNP

## 2018-03-15 DIAGNOSIS — S0012XA Contusion of left eyelid and periocular area, initial encounter: Secondary | ICD-10-CM | POA: Diagnosis not present

## 2018-03-17 NOTE — Progress Notes (Signed)
Cardiology Office Note   Date:  03/18/2018   ID:  Laura Herrera, DOB October 21, 1950, MRN [id number]  PCP:  Darrow Bussing, MD  Cardiologist: Dr. Herbie Baltimore Chief Complaint  Patient presents with  . Follow-up     History of Present Illness: Laura Herrera is a 67 y.o. female who presents for ongoing assessment and management of coronary artery disease, the patient has history of STEMI in 2016 was 100% occlusion of the RCA with distal embolization (drug-eluting stent x2 placed, with relook cath showing patent stents in 2017, hypertension, hyperlipidemia.  She was last seen by Dr. Herbie Baltimore on 04/12/2017 at which time she came to need to complain of short spells of chest discomfort and lower extremity edema.  Patient also reports anxiety.   There was concern that she may have coronary artery spasms.  She was started on amlodipine 5 mg daily, continue indoor and metoprolol.  The patient has evidence of whitecoat syndrome with elevated blood pressures in the office but normal blood pressures at home.  The patient was continued on atorvastatin.  The patient called our office on 03/11/2018 after having a fall after drinking alcohol.  She wondered if her antihypertensive medications were contributing.  The patient has lost over 30 pounds with diet and exercise.  She was advised to take her blood pressure daily and bring with her recordings to this office visit.  She states that she was in the heat all day, had not eaten since the morning, and had been drinking ETOH. Got home after 11 pm and passed out when she walked in the door. She states she does not remember falling. She sustained bruises to her left arm and has bilateral blackened eyes.   This weekend she had a close family member die, and attended the funeral with sunglasses due to her eyes. She is very emotional today and tearful. She says that she is going to stop drinking, although she does not drink heavily.   Past Medical History:  Diagnosis Date    . Anxiety   . Chest pain with high risk for cardiac etiology 11/19/2015   Cardiac catheterization showed stable coronaries with patent RCA stents. FFR of LAD negative. --> LV gram EF suggested Takotsubo versus LAD spasm.  . Complication of anesthesia    pseudo cholinesterace deficiency  . Coronary artery disease    a. 01/2015 Inf STEMI/PCI: RCA 80ost (2.75x12 Promus Premier DES), 100p (2.5x24 Promus Premier DES), 70p/m (2.5x24 Promus Premier DES), 90d (PTCA);  b. 01/2015 Relook Cath: LM nl, LAD 6m, D1 min irregs, LCX nl- L->R collats,  OM1 mod dzs, OM2 mild dizs, RCA 10ost, heavy thrombus p/m, 10d->Aggrastat;  c. 01/2015 Echo: EF 60-65%, no rwma, mod AI, PASP . d. 11/2015: cath showing patent stents   . Depression   . Dyslipidemia   . Headache    "used to have them daily; I no longer do" (11/09/2015)  . Hypertension   . Insomnia   . Kidney stones    "years ago; passed it"  . MRSA infection    h/o MRSA infection in the right foot  . Myocardial infarction (HCC) 01/2015  . NSTEMI (non-ST elevated myocardial infarction) (HCC) 11/09/2015  . PONV (postoperative nausea and vomiting)   . Pseudocholinesterase deficiency   . Psoriasis    hand and foot (11/09/2015)    Past Surgical History:  Procedure Laterality Date  . ANGIOPLASTY  01/12/2015   Procedure: Angioplasty;  Surgeon: Lyn Records, MD;  Location: [facility] INVASIVE CV LAB;  Service: Cardiovascular;;  . APPENDECTOMY    . BREAST BIOPSY    . CARDIAC CATHETERIZATION N/A 01/11/2015   Procedure: Left Heart Cath and Coronary Angiography;  Surgeon: Marykay Lex, MD;  Location: [facility]AS[facility] CV LAB;  Service: Cardiovascular;  Laterality: N/A;  . CARDIAC CATHETERIZATION  01/11/2015   Procedure: Coronary Stent Intervention;  Surgeon: Marykay Lex, MD;  Location: [facility] INVASIVE CV LAB;  Service: Cardiovascular;;  . CARDIAC CATHETERIZATION  01/11/2015   Procedure: Coronary Balloon Angioplasty;  Surgeon: Marykay Lex, MD;  Location: [facility] INVASIVE CV LAB;   Service: Cardiovascular;;  . CARDIAC CATHETERIZATION N/A 01/12/2015   Procedure: Left Heart Cath and Coronary Angiography;  Surgeon: Lyn Records, MD;  Location: [facility] INVASIVE CV LAB;  Service: Cardiovascular;  Laterality: N/A;  . CARDIAC CATHETERIZATION N/A 11/10/2015   Procedure: Left Heart Cath and Coronary Angiography;  Surgeon: Lennette Bihari, MD;  Location: [facility] INVASIVE CV LAB;  Service: Cardiovascular; patent RCA stents. Ostial LM 30%, ostial LAD 40% - FFR negative. Moderate-severe LV dysfunction with hypo-kinesis involving the anterior wall to the apex. EF suggested 30%.   . CARDIAC CATHETERIZATION N/A 11/10/2015   Procedure: Intravascular Pressure Wire/FFR Study;  Surgeon: Lennette Bihari, MD;  Location: [facility] INVASIVE CV LAB;  Service: Cardiovascular;  FFR of LAD negative.  Marland Kitchen DILATION AND CURETTAGE OF UTERUS  1979   S/P miscarriage  . INCISION AND DRAINAGE FOOT Right    "spider bite"  . NOSE SURGERY  X 2   "to remove genetic bone spur"  . TRANSTHORACIC ECHOCARDIOGRAM  11/11/2015   Normal LV size and function. Vigorous function with EF 6570%. GR 1 DD. Mild-moderate MR.     Current Outpatient Medications  Medication Sig Dispense Refill  . acetaminophen (TYLENOL) 500 MG tablet Take 500 mg by mouth every 6 (six) hours as needed for headache.    . ALPRAZolam (XANAX) 0.25 MG tablet TAKE 1 TABLET TWICE A DAY AS NEEDED FOR ANXIETY  0  . amLODipine (NORVASC) 5 MG tablet Take 1 tablet (5 mg total) by mouth daily. 90 tablet 3  . atorvastatin (LIPITOR) 40 MG tablet TAKE 1 TABLET BY MOUTH EVERY DAY 90 tablet 1  . cholecalciferol (VITAMIN D) 1000 UNITS tablet Take 1,000 Units by mouth daily.    . isosorbide mononitrate (IMDUR) 30 MG 24 hr tablet Take 1 tablet (30 mg total) by mouth daily. 30 tablet 2  . metoprolol succinate (TOPROL-XL) 25 MG 24 hr tablet TAKE 1/2 TABLET BY MOUTH DAILY 45 tablet 6  . nitroGLYCERIN (NITROSTAT) 0.4 MG SL tablet Place 1 tablet (0.4 mg total) under the tongue every 5 (five)  minutes x 3 doses as needed for chest pain. 25 tablet 3  . pantoprazole (PROTONIX) 40 MG tablet TAKE 1 TABLET BY MOUTH DAILY AT 12 NOON. 90 tablet 1   No current facility-administered medications for this visit.     Allergies:   Cholinesterase inhibitors; Other; Adhesive [tape]; Shellfish-derived products; Epinephrine; and Vancomycin    Social History:  The patient  reports that she has quit smoking. Her smoking use included cigarettes. She has a 0.20 pack-year smoking history. She has never used smokeless tobacco. She reports that she drinks about 2.0 standard drinks of alcohol per week. She reports that she does not use drugs.   Family History:  The patient's family history includes Alzheimer's disease in her father; Breast cancer (age of onset: 72) in her maternal aunt; Heart Problems in her sister; Heart disease in her maternal  grandmother; Hyperlipidemia in her brother and mother; Hypotension in her sister; Mental illness in her sister; Peripheral vascular disease in her mother; Schizophrenia in her sister.    ROS: All other systems are reviewed and negative. Unless otherwise mentioned in H&P    PHYSICAL EXAM: VS:  BP (!) 156/68   Pulse (!) 56   Ht 5' 7.5" (1.715 m)   Wt 138 lb 6.4 oz (62.8 kg)   BMI 21.36 kg/m  , BMI Body mass index is 21.36 kg/m. GEN: Well nourished, well developed, in no acute distress  HEENT: Bilateral black eyes with ecchymosis peri-orbital.  Neck: no JVD,bilateral carotid bruits, left louder than right,  or masses Cardiac RRR; soft systolic murmurs, rubs, or gallops,no edema  Respiratory:  Clear to auscultation bilaterally, normal work of breathing GI: soft, nontender, nondistended, + BS MS: no deformity or atrophy ecchymosis along the left upper arm.  Skin: warm and dry, no rash Neuro:  Strength and sensation are intact Psych: euthymic mood, full affect   EKG:  Sinus bradycardia rate of 56 bpm.   Recent Labs: No results found for requested labs  within last 8760 hours.    Lipid Panel    Component Value Date/Time   CHOL 183 01/10/2017 1432   TRIG 133 01/10/2017 1432   HDL 74 01/10/2017 1432   CHOLHDL 2.5 01/10/2017 1432   CHOLHDL 2.0 11/10/2015 0520   VLDL 20 11/10/2015 0520   LDLCALC 82 01/10/2017 1432      Wt Readings from Last 3 Encounters:  03/18/18 138 lb 6.4 oz (62.8 kg)  04/12/17 168 lb (76.2 kg)  01/10/17 172 lb (78 kg)      Other studies Reviewed: Carotid Doppler Studies 04/24/2017 Carotid Doppler: Right internal carotid stable less than 40%. Left internal carotid moderate 40-59% which could explain the bruit.  Both external carotids are also greater than 50% narrowed (these are not usually treated, but could also explain bruit.  Right subclavian artery has slightly increased velocities suggesting possible narrowing. We need to check both arm blood pressures next follow-up. Both vertebral arteries are patent.  Stress Test 03/08/2015 Study Highlights    The left ventricular ejection fraction is hyperdynamic (>65%).  Nuclear stress EF: 68%.  There was no ST segment deviation noted during stress.  Defect 1: There is a medium defect of mild severity present in the basal inferior, basal inferolateral, mid inferolateral and apex location.  This is a low risk study.     ASSESSMENT AND PLAN:  1.  Syncope: Multiple etiologies, to include ETOH, being out in the heat all day, not eating and having taken her antihypertensive medications.  This may have been related to orthostatic BP. She has lost 30 lbs with diet and exercise. I will cut back on the isosorbide dose from 60 mg daily to 30 mg daily. She has a history of coronary artery spasm, therefore will leave her on some nitrates. Continue amlodipine as well. Can consider cardiac monitor if this occurs again.   2. Bradycardia: Can consider stopping metoprolol on next office visit if she remains bradycardic. HR did go up with position change.   3. CAD: DES X 2  in 2016. I will stop Plavix today. She she continues to fall this will be a bleeding risk for her.   4. Hypertension: On amlodipine and metoprolol along with nitrates. May need to titrate more. BP's were elevated over the weekend during death of family member. She will take her BP at home and bring  back readings on next appointment.   5. Bilateral carotid bruits: Recent doppler study one year ago did not show occlusive diease. May need follow up doppler study in 6 months.   Current medicines are reviewed at length with the patient today.    Labs/ tests ordered today include: None  Bettey Mare. Liborio Nixon, ANP, AACC   03/18/2018 12:53 PM    [facility] [address] [place], [place] [address] Phone: [phone]; Fax: [phone]

## 2018-03-18 ENCOUNTER — Encounter: Payer: Self-pay | Admitting: Adult Health

## 2018-03-18 ENCOUNTER — Ambulatory Visit (INDEPENDENT_AMBULATORY_CARE_PROVIDER_SITE_OTHER): Payer: Medicare Other | Admitting: Adult Health

## 2018-03-18 VITALS — BP 156/68 | HR 56 | Ht 67.5 in | Wt 138.4 lb

## 2018-03-18 DIAGNOSIS — I2510 Atherosclerotic heart disease of native coronary artery without angina pectoris: Secondary | ICD-10-CM | POA: Diagnosis not present

## 2018-03-18 DIAGNOSIS — I10 Essential (primary) hypertension: Secondary | ICD-10-CM | POA: Diagnosis not present

## 2018-03-18 DIAGNOSIS — R55 Syncope and collapse: Secondary | ICD-10-CM

## 2018-03-18 DIAGNOSIS — E7800 Pure hypercholesterolemia, unspecified: Secondary | ICD-10-CM | POA: Diagnosis not present

## 2018-03-18 DIAGNOSIS — R0989 Other specified symptoms and signs involving the circulatory and respiratory systems: Secondary | ICD-10-CM | POA: Diagnosis not present

## 2018-03-18 MED ORDER — ISOSORBIDE MONONITRATE ER 30 MG PO TB24: 30.0000 mg | ORAL_TABLET | Freq: Every day | ORAL | 2 refills | Status: DC

## 2018-03-18 NOTE — Patient Instructions (Signed)
Medication Instructions:  Ok to stop plavix  DECREASE ISOSORBIDE 30MG  DAILY  If you need a refill on your cardiac medications before your next appointment, please call your pharmacy.  Special Instructions: TAKE AND LOG YOU BP TWICE DAILY  Follow-Up: Your physician wants you to follow-up in: 1 MONTH WITH KATHRYN LAWRENCE (NURSE PRACTIONIER), DNP,AACC IF PRIMARY CARDIOLOGIST IS UNAVAILABLE.  Thank you for choosing [facility] at [facility]!!

## 2018-03-26 ENCOUNTER — Other Ambulatory Visit: Payer: Self-pay | Admitting: Cardiology

## 2018-03-26 DIAGNOSIS — I6523 Occlusion and stenosis of bilateral carotid arteries: Secondary | ICD-10-CM

## 2018-03-28 DIAGNOSIS — I8312 Varicose veins of left lower extremity with inflammation: Secondary | ICD-10-CM | POA: Diagnosis not present

## 2018-03-28 DIAGNOSIS — I8311 Varicose veins of right lower extremity with inflammation: Secondary | ICD-10-CM | POA: Diagnosis not present

## 2018-03-28 DIAGNOSIS — L0101 Non-bullous impetigo: Secondary | ICD-10-CM | POA: Diagnosis not present

## 2018-03-28 DIAGNOSIS — I872 Venous insufficiency (chronic) (peripheral): Secondary | ICD-10-CM | POA: Diagnosis not present

## 2018-04-19 ENCOUNTER — Other Ambulatory Visit: Payer: Self-pay | Admitting: Cardiovascular Disease

## 2018-04-27 NOTE — Progress Notes (Signed)
/\ Cardiology Office Note   Date:  04/29/2018   ID:  Laura Herrera, DOB 24-Nov-1950, MRN [id number]  PCP:  Darrow Bussing, MD  Cardiologist: Dr. Herbie Baltimore Chief Complaint  Patient presents with  . Dizziness  . Follow-up     History of Present Illness: Laura Herrera is a 67 y.o. female who presents for ongoing assessment and management of CAD with history of STEMI in 2016 found to have 100% occlusion of the RCA with distal embolization, requiring DES x2 with relook cath in 2017 revealing patent stents; hypertension, hyperlipidemia, with recurrent discomfort in her chest and lower extremity edema.  There was a concern that she may have coronary spasms and she was started on amlodipine 5 mg daily along with Imdur and metoprolol.  On last office visit dated 03/18/2018 the patient had a syncopal episode after spending all day outside and drinking alcohol at an outside celebratio, and not eating any food.  The patient had returned home around 11 PM and passed out when she walked to the door, not remembering that she fell.  She did sustain bruises to her left arm, and also had bilateral blackened eyes.  She resolved that she was going to stop drinking alcohol although she did not drink heavily.  She felt her medications were interacting with the alcohol.   I cut back on her nitrates from 60 mg daily to 30 mg daily.  Due to history of coronary artery spasm I wanted to leave her on a low dose of nitrates.  Consideration for cardiac monitor if she had recurrent symptoms.  She was also noted to be mildly bradycardic with consideration of stopping metoprolol on next office visit if she remains symptomatic as well.  Orthostatics were completed in the office, and heart rate did normally increase with position change, with no evidence of significant bradycardia.  Her Plavix was stopped as she had had stents placed in 2016 to avoid any bleeding issues if she had recurrent falls.  She comes today feeling much  better, she denies any further dizziness. She has taken her BP's daily and seen readings of 140's to 150;s systolic.   Past Medical History:  Diagnosis Date  . Anxiety   . Chest pain with high risk for cardiac etiology 11/19/2015   Cardiac catheterization showed stable coronaries with patent RCA stents. FFR of LAD negative. --> LV gram EF suggested Takotsubo versus LAD spasm.  . Complication of anesthesia    pseudo cholinesterace deficiency  . Coronary artery disease    a. 01/2015 Inf STEMI/PCI: RCA 80ost (2.75x12 Promus Premier DES), 100p (2.5x24 Promus Premier DES), 70p/m (2.5x24 Promus Premier DES), 90d (PTCA);  b. 01/2015 Relook Cath: LM nl, LAD 53m, D1 min irregs, LCX nl- L->R collats,  OM1 mod dzs, OM2 mild dizs, RCA 10ost, heavy thrombus p/m, 10d->Aggrastat;  c. 01/2015 Echo: EF 60-65%, no rwma, mod AI, PASP . d. 11/2015: cath showing patent stents   . Depression   . Dyslipidemia   . Headache    "used to have them daily; I no longer do" (11/09/2015)  . Hypertension   . Insomnia   . Kidney stones    "years ago; passed it"  . MRSA infection    h/o MRSA infection in the right foot  . Myocardial infarction (HCC) 01/2015  . NSTEMI (non-ST elevated myocardial infarction) (HCC) 11/09/2015  . PONV (postoperative nausea and vomiting)   . Pseudocholinesterase deficiency   . Psoriasis    hand and foot (11/09/2015)  Past Surgical History:  Procedure Laterality Date  . ANGIOPLASTY  01/12/2015   Procedure: Angioplasty;  Surgeon: Lyn Records, MD;  Location: [facility] INVASIVE CV LAB;  Service: Cardiovascular;;  . APPENDECTOMY    . BREAST BIOPSY    . CARDIAC CATHETERIZATION N/A 01/11/2015   Procedure: Left Heart Cath and Coronary Angiography;  Surgeon: Marykay Lex, MD;  Location: [facility] INVASIVE CV LAB;  Service: Cardiovascular;  Laterality: N/A;  . CARDIAC CATHETERIZATION  01/11/2015   Procedure: Coronary Stent Intervention;  Surgeon: Marykay Lex, MD;  Location: [facility] INVASIVE CV LAB;  Service:  Cardiovascular;;  . CARDIAC CATHETERIZATION  01/11/2015   Procedure: Coronary Balloon Angioplasty;  Surgeon: Marykay Lex, MD;  Location: [facility] INVASIVE CV LAB;  Service: Cardiovascular;;  . CARDIAC CATHETERIZATION N/A 01/12/2015   Procedure: Left Heart Cath and Coronary Angiography;  Surgeon: Lyn Records, MD;  Location: [facility] INVASIVE CV LAB;  Service: Cardiovascular;  Laterality: N/A;  . CARDIAC CATHETERIZATION N/A 11/10/2015   Procedure: Left Heart Cath and Coronary Angiography;  Surgeon: Lennette Bihari, MD;  Location: [facility] INVASIVE CV LAB;  Service: Cardiovascular; patent RCA stents. Ostial LM 30%, ostial LAD 40% - FFR negative. Moderate-severe LV dysfunction with hypo-kinesis involving the anterior wall to the apex. EF suggested 30%.   . CARDIAC CATHETERIZATION N/A 11/10/2015   Procedure: Intravascular Pressure Wire/FFR Study;  Surgeon: Lennette Bihari, MD;  Location: [facility] INVASIVE CV LAB;  Service: Cardiovascular;  FFR of LAD negative.  Marland Kitchen DILATION AND CURETTAGE OF UTERUS  1979   S/P miscarriage  . INCISION AND DRAINAGE FOOT Right    "spider bite"  . NOSE SURGERY  X 2   "to remove genetic bone spur"  . TRANSTHORACIC ECHOCARDIOGRAM  11/11/2015   Normal LV size and function. Vigorous function with EF 6570%. GR 1 DD. Mild-moderate MR.     Current Outpatient Medications  Medication Sig Dispense Refill  . acetaminophen (TYLENOL) 500 MG tablet Take 500 mg by mouth every 6 (six) hours as needed for headache.    . ALPRAZolam (XANAX) 0.25 MG tablet TAKE 1 TABLET TWICE A DAY AS NEEDED FOR ANXIETY  0  . amLODipine (NORVASC) 5 MG tablet Take 1 tablet (5 mg total) by mouth daily. 90 tablet 1  . atorvastatin (LIPITOR) 40 MG tablet Take 1 tablet (40 mg total) by mouth daily. 90 tablet 1  . cholecalciferol (VITAMIN D) 1000 UNITS tablet Take 1,000 Units by mouth daily.    . isosorbide mononitrate (IMDUR) 30 MG 24 hr tablet Take 1 tablet (30 mg total) by mouth daily. 90 tablet 1  . metoprolol succinate (TOPROL-XL)  25 MG 24 hr tablet Take 0.5 tablets (12.5 mg total) by mouth daily. 45 tablet 1  . nitroGLYCERIN (NITROSTAT) 0.4 MG SL tablet Place 1 tablet (0.4 mg total) under the tongue every 5 (five) minutes x 3 doses as needed for chest pain. 25 tablet 3  . pantoprazole (PROTONIX) 40 MG tablet TAKE 1 TABLET BY MOUTH DAILY AT 12 NOON. 90 tablet 1   No current facility-administered medications for this visit.     Allergies:   Cholinesterase inhibitors; Other; Adhesive [tape]; Shellfish-derived products; Epinephrine; and Vancomycin    Social History:  The patient  reports that she has quit smoking. Her smoking use included cigarettes. She has a 0.20 pack-year smoking history. She has never used smokeless tobacco. She reports that she drinks about 2.0 standard drinks of alcohol per week. She reports that she does not use drugs.  Family History:  The patient's family history includes Alzheimer's disease in her father; Breast cancer (age of onset: 62) in her maternal aunt; Heart Problems in her sister; Heart disease in her maternal grandmother; Hyperlipidemia in her brother and mother; Hypotension in her sister; Mental illness in her sister; Peripheral vascular disease in her mother; Schizophrenia in her sister.    ROS: All other systems are reviewed and negative. Unless otherwise mentioned in H&P    PHYSICAL EXAM: VS:  BP (!) 142/65   Pulse 60   Ht 5\' 8"  (1.727 m)   Wt 139 lb 3.2 oz (63.1 kg)   SpO2 97%   BMI 21.17 kg/m  , BMI Body mass index is 21.17 kg/m. GEN: Well nourished, well developed, in no acute distress  HEENT: normal  Neck: no JVD, bilateral carotid bruits, or masses Cardiac: RRR; 2/6 systolic murmurs, rubs, or gallops,no edema  Respiratory: Clear to auscultation bilaterally, normal work of breathing GI: soft, nontender, nondistended, + BS MS: no deformity or atrophy  Skin: warm and dry, no rash Neuro:  Strength and sensation are intact Psych: euthymic mood, full affect   EKG:   Not completed today.   Recent Labs: No results found for requested labs within last 8760 hours.    Lipid Panel    Component Value Date/Time   CHOL 183 01/10/2017 1432   TRIG 133 01/10/2017 1432   HDL 74 01/10/2017 1432   CHOLHDL 2.5 01/10/2017 1432   CHOLHDL 2.0 11/10/2015 0520   VLDL 20 11/10/2015 0520   LDLCALC 82 01/10/2017 1432      Wt Readings from Last 3 Encounters:  04/29/18 139 lb 3.2 oz (63.1 kg)  03/18/18 138 lb 6.4 oz (62.8 kg)  04/12/17 168 lb (76.2 kg)      Other studies Reviewed: Carotid Ultrasound 04/24/2017 Notes recorded by Marykay Lex, MD on 04/27/2017 at 2:04 PM EDT Carotid Doppler: Right internal carotid stable less than 40%. Left internal carotid moderate 40-59% which could explain the bruit.  Both external carotids are also greater than 50% narrowed (these are not usually treated, but could also explain bruit.  Right subclavian artery has slightly increased velocities suggesting possible narrowing. We need to check both arm blood pressures next follow-up. Both vertebral arteries are patent.  ASSESSMENT AND PLAN:  1.  Syncope: She is much better with reduction of nitrates. No further symptoms. She will change the batteries in her BP machine and take her BP in both arms once a day and record. No other changes in her regimen.   2. Hypertension: I have checked BP's in both arms as recommended by Dr. Herbie Baltimore. Right arm, 138/62 left arm 160/70. Continue to monitor her response to current regimen, avoid higher doses of nitrates.   3. CAD: Hx of STEMI in 2016, stents to the RCA. She continues on secondary prevention. She was noted to have coronary spasm, and therefore nitrates will continue at lower dose. Continue BB.   4. Hypercholesterolemia: Continue atorvastatin as directed.    Current medicines are reviewed at length with the patient today.    Labs/ tests ordered today include: None   Bettey Mare. Liborio Nixon, ANP, AACC   04/29/2018 5:17 PM      [facility] [address] [place], [place] [address] Phone: [phone]; Fax: [phone]

## 2018-04-29 ENCOUNTER — Encounter: Payer: Self-pay | Admitting: Adult Health

## 2018-04-29 ENCOUNTER — Ambulatory Visit (INDEPENDENT_AMBULATORY_CARE_PROVIDER_SITE_OTHER): Payer: Medicare Other | Admitting: Adult Health

## 2018-04-29 VITALS — BP 142/65 | HR 60 | Ht 68.0 in | Wt 139.2 lb

## 2018-04-29 DIAGNOSIS — I10 Essential (primary) hypertension: Secondary | ICD-10-CM | POA: Diagnosis not present

## 2018-04-29 DIAGNOSIS — I2510 Atherosclerotic heart disease of native coronary artery without angina pectoris: Secondary | ICD-10-CM | POA: Diagnosis not present

## 2018-04-29 DIAGNOSIS — I6523 Occlusion and stenosis of bilateral carotid arteries: Secondary | ICD-10-CM | POA: Diagnosis not present

## 2018-04-29 DIAGNOSIS — R55 Syncope and collapse: Secondary | ICD-10-CM

## 2018-04-29 MED ORDER — METOPROLOL SUCCINATE ER 25 MG PO TB24: 12.5000 mg | ORAL_TABLET | Freq: Every day | ORAL | 1 refills | Status: DC

## 2018-04-29 MED ORDER — ATORVASTATIN CALCIUM 40 MG PO TABS: 40.0000 mg | ORAL_TABLET | Freq: Every day | ORAL | 1 refills | Status: DC

## 2018-04-29 MED ORDER — AMLODIPINE BESYLATE 5 MG PO TABS: 5.0000 mg | ORAL_TABLET | Freq: Every day | ORAL | 1 refills | Status: DC

## 2018-04-29 MED ORDER — ISOSORBIDE MONONITRATE ER 30 MG PO TB24: 30.0000 mg | ORAL_TABLET | Freq: Every day | ORAL | 1 refills | Status: DC

## 2018-04-29 MED ORDER — PANTOPRAZOLE SODIUM 40 MG PO TBEC: DELAYED_RELEASE_TABLET | ORAL | 1 refills | Status: DC

## 2018-04-29 NOTE — Patient Instructions (Signed)
Medication Instructions:  NO CHANGES- Your physician recommends that you continue on your current medications as directed. Please refer to the Current Medication list given to you today.  If you need a refill on your cardiac medications before your next appointment, please call your pharmacy.  Special Instructions: TAKE YOUR BP ON THE RIGHT ARM-CONTINUE TO LOG  Follow-Up: Your physician wants you to follow-up in: 3 MONTHS WITH DR HARDING -OR- KATHRYN LAWRENCE DNP(NURSE PRACTITIONER),AACC IF PRIMARY CARDIOLOGIST IS UNAVAILABLE.    Thank you for choosing [facility] at [facility]!!

## 2018-05-30 DIAGNOSIS — H43811 Vitreous degeneration, right eye: Secondary | ICD-10-CM | POA: Diagnosis not present

## 2018-06-10 DIAGNOSIS — L03115 Cellulitis of right lower limb: Secondary | ICD-10-CM | POA: Diagnosis not present

## 2018-06-12 ENCOUNTER — Other Ambulatory Visit: Payer: Self-pay | Admitting: Cardiology

## 2018-06-14 ENCOUNTER — Other Ambulatory Visit: Payer: Self-pay | Admitting: Cardiology

## 2018-06-14 NOTE — Telephone Encounter (Signed)
Rx request sent to pharmacy.  

## 2018-07-05 ENCOUNTER — Other Ambulatory Visit: Payer: Self-pay | Admitting: Cardiology

## 2018-07-13 ENCOUNTER — Other Ambulatory Visit: Payer: Self-pay | Admitting: Cardiology

## 2018-08-14 ENCOUNTER — Ambulatory Visit: Payer: Medicare Other | Admitting: Cardiology

## 2018-08-30 DIAGNOSIS — L03115 Cellulitis of right lower limb: Secondary | ICD-10-CM | POA: Diagnosis not present

## 2018-09-10 DIAGNOSIS — H40013 Open angle with borderline findings, low risk, bilateral: Secondary | ICD-10-CM | POA: Diagnosis not present

## 2018-09-10 DIAGNOSIS — H43811 Vitreous degeneration, right eye: Secondary | ICD-10-CM | POA: Diagnosis not present

## 2018-09-27 ENCOUNTER — Ambulatory Visit (INDEPENDENT_AMBULATORY_CARE_PROVIDER_SITE_OTHER): Payer: Medicare Other | Admitting: Cardiology

## 2018-09-27 ENCOUNTER — Encounter: Payer: Self-pay | Admitting: Cardiology

## 2018-09-27 VITALS — BP 128/65 | HR 61 | Ht 67.5 in | Wt 140.6 lb

## 2018-09-27 DIAGNOSIS — E785 Hyperlipidemia, unspecified: Secondary | ICD-10-CM | POA: Diagnosis not present

## 2018-09-27 DIAGNOSIS — I2119 ST elevation (STEMI) myocardial infarction involving other coronary artery of inferior wall: Secondary | ICD-10-CM | POA: Diagnosis not present

## 2018-09-27 DIAGNOSIS — I771 Stricture of artery: Secondary | ICD-10-CM

## 2018-09-27 DIAGNOSIS — R0609 Other forms of dyspnea: Secondary | ICD-10-CM | POA: Diagnosis not present

## 2018-09-27 DIAGNOSIS — I10 Essential (primary) hypertension: Secondary | ICD-10-CM | POA: Diagnosis not present

## 2018-09-27 DIAGNOSIS — I2510 Atherosclerotic heart disease of native coronary artery without angina pectoris: Secondary | ICD-10-CM

## 2018-09-27 DIAGNOSIS — R002 Palpitations: Secondary | ICD-10-CM

## 2018-09-27 DIAGNOSIS — Z9861 Coronary angioplasty status: Secondary | ICD-10-CM | POA: Diagnosis not present

## 2018-09-27 DIAGNOSIS — I25111 Atherosclerotic heart disease of native coronary artery with angina pectoris with documented spasm: Secondary | ICD-10-CM | POA: Diagnosis not present

## 2018-09-27 DIAGNOSIS — R0989 Other specified symptoms and signs involving the circulatory and respiratory systems: Secondary | ICD-10-CM

## 2018-09-27 NOTE — Patient Instructions (Addendum)
Medication Instructions:  NOT NEEDED If you need a refill on your cardiac medications before your next appointment, please call your pharmacy.   Lab work: CMP LIPID- DO NOT EAT OR DRINK THE MORNING -OF THE TEST If you have labs (blood work) drawn today and your tests are completely normal, you will receive your results only by: Marland Kitchen MyChart Message (if you have MyChart) OR . A paper copy in the mail If you have any lab test that is abnormal or we need to change your treatment, we will call you to review the results.  Testing/Procedures: NOT NEEDED  Follow-Up: At [facility], you and your health needs are our priority.  As part of our continuing mission to provide you with exceptional heart care, we have created designated Provider Care Teams.  These Care Teams include your primary Cardiologist (physician) and Advanced Practice Providers (APPs -  Physician Assistants and Nurse Practitioners) who all work together to provide you with the care you need, when you need it. You will need a follow up appointment in 12 months feb 2021.  Please call our office 2 months in advance to schedule this appointment.  You may see [name], MD or one of the following Advanced Practice Providers on your designated Care Team:   Theodore Demark, PA-C . [name], DNP, ANP  Any Other Special Instructions Will Be Listed Below (If Applicable).

## 2018-09-27 NOTE — Progress Notes (Signed)
PCP: Darrow BussingKoirala, Dibas, MD  Clinic Note: Chief Complaint  Patient presents with  . Follow-up    Doing well.  Some fast heart rate spells after taking p.m. meds  . Coronary Artery Disease    No angina    HPI: Laura Herrera is a 68 y.o. female with a CAD history noted below who presents today for ~5 month follow-up -last seen by Joni ReiningKathryn Lawrence, NP   CAD (s/p STEMI in 2016 with 100% occlusion of RCA with distal embolization (DES x2 placed) with relook cath showing patent stents, patent by cath in 2017),   HTN, HLD  Laura Herrera was last seen by me in September 2018.  At that time she was concerned about elevated blood pressure.  She is also having some issues with anxiety.  She also noted short episodes of chest discomfort and lower extremity edema. -->  I started her on low-dose  She was then seen twice this fall by Joni ReiningKathryn Lawrence, NP.  First in August and then again in September. --She had an episode of falling after drinking alcohol.  She noted having lost 30 pounds exercise.  (Had been in the heat all day, had not eaten since the morning.  She passed out after getting home at 11 PM). ->  Her Imdur dose was cut back to 30 mg  -> she had some mild bradycardia and stopping beta-blocker.   -> Plavix was discontinued *She noted that she felt much better on follow-up.  No further dizziness.  Blood pressures were little higher in the 140s to 150s.  (BP check there showed higher pressures in the left compared to right arm (recommended based on carotid Dopplers in 2018).  Heart rate was stable so beta-blocker was continued.  Less bruising from being off Plavix.  Recent Hospitalizations:   none  Studies Personally Reviewed - (if available, images/films reviewed: From Epic Chart or Care Everywhere)  None  Interval History: Laura Herrera returns today for follow-up doing well.  Has noted that the dizziness has notably improved.  No further chest pain since starting amlodipine.  The last 2  weeks have been quite stressful & she has noted some racing heartbeats shortly after taking PM meds which is somewhat new symptom for her.  Otherwise, she really is doing quite well.  She has done amazingly well with weight loss up to about 40pounds and feels a whole lot better.  Her energy level is better.  She is eating much healthier and doing Pilates exercises 2 times a week in addition to some other calisthenic exercises.  She really cannot do much walking because of chronic foot problems.  Her blood pressures have normalized at home.  She is actually actually able to relax now.  She still takes the Xanax to help her sleep and with that does okay.  Maybe takes an additional dose as needed during the day. She denies any PND, orthopnea or edema.  No syncope/near syncope or TIA/amaurosis fugax.  Other than the fast heart rate spells at night after taking her medications, she denies any rapid irregular heartbeats.  She really does not do much walking, so unable to assess for claudication.   ROS: A comprehensive was performed. Review of Systems  Constitutional: Positive for weight loss (Intentional).  HENT: Negative for congestion and nosebleeds.   Respiratory: Negative for cough, sputum production and wheezing.        Per history of present illness  Cardiovascular: Positive for palpitations (Per HPI). Negative for leg  swelling (Doing better).  Gastrointestinal: Negative for blood in stool and melena.  Genitourinary: Negative for hematuria.  Musculoskeletal: Negative for falls, joint pain and myalgias (More related to cramping).  Skin: Negative.   Neurological: Positive for dizziness (Sometimes with different changes in position) and headaches.  Endo/Heme/Allergies: Positive for environmental allergies. Does not bruise/bleed easily.  Psychiatric/Behavioral: Negative for depression and memory loss. The patient is nervous/anxious (Trying to cut back on Xanax dosing).        Still having  nightmares; still has borderline panic attacks.  All other systems reviewed and are negative.  I have reviewed and (if needed) personally updated the patient's problem list, medications, allergies, past medical and surgical history, social and family history.   Past Medical History:  Diagnosis Date  . Anxiety   . CAD S/P percutaneous coronary angioplasty    a. 01/2015 Inf STEMI/PCI: ostRCA 80% (Promus DES 2.75x12 - 3.1 mm), 100% & 70% p-mRCA covered with 1 DES (Promus DES 2.5x24  - 2.8 mm ), dRCA 90%(PTCA) -10% ;  b. 01/2015 Relook: LM nl, mLAD 20%, LCX nl- L->R collats, Thrombus in p-mRCA stent->Aggrastat;  c. 01/2015 Echo: EF 60-65%, no RWMA, mod AI, PAP . d. 11/2015: CATH: patent stents, FFR neg LM-LAD  . Chest pain with high risk for cardiac etiology 11/19/2015   Cardiac catheterization showed stable coronaries with patent RCA stents. FFR of LAD negative. --> LV gram EF suggested Takotsubo versus LAD spasm.  . Complication of anesthesia    pseudo cholinesterace deficiency  . Depression   . Dyslipidemia   . Headache    "used to have them daily; I no longer do" (11/09/2015)  . Hypertension   . Insomnia   . Kidney stones    "years ago; passed it"  . MRSA infection    h/o MRSA infection in the right foot  . Myocardial infarction (HCC) 01/2015  . NSTEMI (non-ST elevated myocardial infarction) (HCC) 11/09/2015  . PONV (postoperative nausea and vomiting)   . Pseudocholinesterase deficiency   . Psoriasis    hand and foot (11/09/2015)    Past Surgical History:  Procedure Laterality Date  . ANGIOPLASTY  01/12/2015   Procedure: Angioplasty;  Surgeon: Lyn Records, MD;  Location: [facility] INVASIVE CV LAB;  Service: Cardiovascular;; Unable to wire RCA -- treated plaque protrusion in pRCA stent with Aggrastat infusion.  . APPENDECTOMY    . BREAST BIOPSY    . CARDIAC CATHETERIZATION N/A 01/11/2015   Procedure: Left Heart Cath and Coronary Angiography;  Surgeon: Marykay Lex, MD;  Location: [facility]  INVASIVE CV LAB;  Service: Cardiovascular;  ost RCA 80% (calcified), pRCA 100% (followed by 70% tandem lesion), dRCA 90%.   Mild to moderate disease in LAD and circumflex  . CARDIAC CATHETERIZATION  01/11/2015   Procedure: Coronary Stent Intervention;  Surgeon: Marykay Lex, MD;  Location: [facility] INVASIVE CV LAB;  Service: Cardiovascular;; ost RCA 80%- cutting balloon PTCA-> DES PCI (2.75 x 12 Promus DES - 3.1 mm); pRCA (covering tandem 100% -7-%) DES PCI (Promus DES 2.5 x 24 --> 2.8 mm)  . CARDIAC CATHETERIZATION  01/11/2015   Procedure: Coronary Balloon Angioplasty;  Surgeon: Marykay Lex, MD;  Location: [facility] INVASIVE CV LAB;  Service: Cardiovascular;;PTCA of dRCA 90% --> 10%.  . CARDIAC CATHETERIZATION N/A 01/12/2015   Procedure: Left Heart Cath and Coronary Angiography;  Surgeon: Lyn Records, MD;  Location: [facility] INVASIVE CV LAB;  Service: Cardiovascular;; Relook Cath for recurrent CP post STEMI PCI --> difficult to  engage ostial RCA stent (unable to wire) - plaque protrusion in pRCA stent --> Rx with Aggrastat infusion.  Marland Kitchen CARDIAC CATHETERIZATION N/A 11/10/2015   Procedure: Left Heart Cath and Coronary Angiography;  Surgeon: Lennette Bihari, MD;  Location: [facility] INVASIVE CV LAB;  Service: Cardiovascular; patent RCA stents. Ostial LM 30%, ostial LAD 40% - FFR negative. Moderate-severe LV dysfunction with hypo-kinesis involving the anterior wall to the apex. EF suggested 30%. (? Takotsubo)  . CARDIAC CATHETERIZATION N/A 11/10/2015   Procedure: Intravascular Pressure Wire/FFR Study;  Surgeon: Lennette Bihari, MD;  Location: [facility] INVASIVE CV LAB;  Service: Cardiovascular;  FFR of LAD negative.  Marland Kitchen DILATION AND CURETTAGE OF UTERUS  1979   S/P miscarriage  . INCISION AND DRAINAGE FOOT Right    "spider bite"  . NOSE SURGERY  X 2   "to remove genetic bone spur"  . TRANSTHORACIC ECHOCARDIOGRAM  11/11/2015   Normal LV size and function. Vigorous function with EF 6570%. GR 1 DD. Mild-moderate MR.   Diagnostic Angiogram  April 2017    Current Meds  Medication Sig  . acetaminophen (TYLENOL) 500 MG tablet Take 500 mg by mouth every 6 (six) hours as needed for headache.  . ALPRAZolam (XANAX) 0.25 MG tablet TAKE 1 TABLET TWICE A DAY AS NEEDED FOR ANXIETY  . amLODipine (NORVASC) 5 MG tablet TAKE 1 TABLET BY MOUTH EVERY DAY  . atorvastatin (LIPITOR) 40 MG tablet TAKE 1 TABLET BY MOUTH EVERY DAY  . cholecalciferol (VITAMIN D) 1000 UNITS tablet Take 1,000 Units by mouth daily.  . isosorbide mononitrate (IMDUR) 30 MG 24 hr tablet Take 1 tablet (30 mg total) by mouth daily.  . metoprolol succinate (TOPROL-XL) 25 MG 24 hr tablet Take 0.5 tablets (12.5 mg total) by mouth daily.  . nitroGLYCERIN (NITROSTAT) 0.4 MG SL tablet Place 1 tablet (0.4 mg total) under the tongue every 5 (five) minutes x 3 doses as needed for chest pain.  . pantoprazole (PROTONIX) 40 MG tablet TAKE 1 TABLET BY MOUTH DAILY AT 12 NOON.  . pantoprazole (PROTONIX) 40 MG tablet TAKE 1 TABLET BY MOUTH DAILY AT 12 NOON.    Allergies  Allergen Reactions  . 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  . Other Other (See Comments)    Sensitive to the electrodes  . Adhesive [Tape] Other (See Comments)    Tears skin off, and "paper" tape also  . Shellfish-Derived Products Swelling    Crabs, lobster, crayfish; tissues in joints swelled  . Epinephrine Palpitations    Palpitations and involuntary body jerking  . Vancomycin Hives and Rash    severe     Social History   Tobacco Use  . Smoking status: Former Smoker    Packs/day: 0.10    Years: 2.00    Pack years: 0.20    Types: Cigarettes  . Smokeless tobacco: Never Used  . Tobacco comment: "quit smoking in the 1980s"  Substance Use Topics  . Alcohol use: Yes    Alcohol/week: 2.0 standard drinks    Types: 2 Glasses of wine per week  . Drug use: No   Social History   Social History Narrative   She is under a  significant amount of stress socially.    Her husband committed suicide several years ago - she continues to have difficulty with coping.    family history includes Alzheimer's disease in her father; Breast cancer (age of onset: 11) in her maternal  aunt; Heart Problems in her sister; Heart disease in her maternal grandmother; Hyperlipidemia in her brother and mother; Hypotension in her sister; Mental illness in her sister; Peripheral vascular disease in her mother; Schizophrenia in her sister.  Wt Readings from Last 3 Encounters:  09/27/18 140 lb 9.6 oz (63.8 kg)  04/29/18 139 lb 3.2 oz (63.1 kg)  03/18/18 138 lb 6.4 oz (62.8 kg)    PHYSICAL EXAM BP 128/65   Pulse 61   Ht 5' 7.5" (1.715 m)   Wt 140 lb 9.6 oz (63.8 kg)   BMI 21.70 kg/m  Physical Exam  Constitutional: She is oriented to person, place, and time. She appears well-developed and well-nourished. No distress.  Well-groomed.  Much healthier looking.  HENT:  Head: Normocephalic and atraumatic.  Neck: Trachea normal and normal range of motion. Neck supple. No hepatojugular reflux and no JVD present. Carotid bruit is present (Left-sided). No thyromegaly present.  Cardiovascular: Normal rate, regular rhythm, normal heart sounds and intact distal pulses.  No extrasystoles are present. PMI is not displaced. Exam reveals no gallop and no friction rub.  No murmur heard. Pulmonary/Chest: Effort normal and breath sounds normal. No respiratory distress. She has no wheezes. She has no rales.  Abdominal: Soft. Bowel sounds are normal. She exhibits no distension. There is no abdominal tenderness. There is no rebound.  Musculoskeletal: Normal range of motion.        General: No edema (Trivial puffy swelling).  Neurological: She is alert and oriented to person, place, and time.  Psychiatric: She has a normal mood and affect. Her behavior is normal. Judgment and thought content normal.  Seems to be in better spirits.  Nursing note and vitals  reviewed.   Adult ECG Report Normal sinus rhythm, rate 61 bpm.  Normal axis, intervals and durations.  Normal EKG.  Other studies Reviewed: Additional studies/ records that were reviewed today include:  Recent Labs:   Lab Results  Component Value Date   CHOL 183 01/10/2017   HDL 74 01/10/2017   LDLCALC 82 01/10/2017   TRIG 133 01/10/2017   CHOLHDL 2.5 01/10/2017   Lab Results  Component Value Date   CREATININE 0.81 01/10/2017   BUN 15 01/10/2017   NA 141 01/10/2017   K 4.9 01/10/2017   CL 107 (H) 01/10/2017   CO2 20 01/10/2017   ASSESSMENT / PLAN: Problem List Items Addressed This Visit    CAD S/P PCI of the ostial and mid/distal RCA - Primary (Chronic)    Very tortuous RCA status post PCI.  Relook cath looked stable.  Due to the difficulty of RCA PCI, the plan was for her to stay on lifelong Plavix.  Interestingly though she is not no longer on it.  I do want her to restart back on aspirin.  Continue beta-blocker, statin and calcium channel blocker for antianginal effect.       Relevant Orders   Comprehensive metabolic panel   Coronary artery disease with angina pectoris with documented spasm (HCC) (Chronic)    She did have some potential evidence of spasm.  Was started on Imdur and now on amlodipine and this seems to have resolved her symptoms.  No change.      Relevant Orders   Comprehensive metabolic panel   Essential hypertension (Chronic)    Stable blood pressure today.  Continue current dose of amlodipine and Toprol.      Exertional dyspnea   Heart palpitations (Chronic)    Nighttime palpitations after taking meds.  I would like for her to change taking metoprolol to dinnertime instead of nightly.  This will help reduce the palpitations.      Relevant Orders   EKG 12-Lead   Lipid panel   Comprehensive metabolic panel   Hyperlipidemia with target LDL less than 70 (Chronic)    Is due for labs to be checked.  We will check lipids and chemistry panel.   Pending results may need to adjust her medication regimen.  She did not tolerate 80 of atorvastatin because of muscle aches.  I would therefore consider changing to 40 mg Crestor.      Relevant Orders   Lipid panel   Comprehensive metabolic panel   Left carotid bruit (Chronic)    She was post that Dopplers done last year, they were not done.  With this and her subclavian artery stenosis, it would be reasonable to reassess.  We will reorder carotid Dopplers to be done in the spring.      ST elevation myocardial infarction (STEMI) of inferior wall, subsequent episode of care [facility]) (Chronic)    Notable infarct on stress test evaluation, but improved EF.  Relook cath showed patent stents and negative FFR in the LAD.  No longer having any angina or heart failure symptoms.  Recovered well.      Stenosis of right subclavian artery (HCC) (Chronic)    Will need to be assessed using carotid Dopplers as well.         Current medicines are reviewed at length with the patient today. (+/- concerns) None The following changes have been made: None  Patient Instructions  Medication Instructions:  NOT NEEDED If you need a refill on your cardiac medications before your next appointment, please call your pharmacy.   Lab work: CMP LIPID- DO NOT EAT OR DRINK THE MORNING -OF THE TEST If you have labs (blood work) drawn today and your tests are completely normal, you will receive your results only by: Marland Kitchen MyChart Message (if you have MyChart) OR . A paper copy in the mail If you have any lab test that is abnormal or we need to change your treatment, we will call you to review the results.  Testing/Procedures: NOT NEEDED  Follow-Up: At [facility], you and your health needs are our priority.  As part of our continuing mission to provide you with exceptional heart care, we have created designated Provider Care Teams.  These Care Teams include your primary Cardiologist (physician) and Advanced  Practice Providers (APPs -  Physician Assistants and Nurse Practitioners) who all work together to provide you with the care you need, when you need it. You will need a follow up appointment in 12 months feb 2021.  Please call our office 2 months in advance to schedule this appointment.  You may see [name], MD or one of the following Advanced Practice Providers on your designated Care Team:   Theodore Demark, PA-C . [name], DNP, ANP  Any Other Special Instructions Will Be Listed Below (If Applicable).   Studies Ordered:   Orders Placed This Encounter  Procedures  . Lipid panel  . Comprehensive metabolic panel  . EKG 12-Lead      [name], M.D., M.S. Interventional Cardiologist   Pager # [phone] Phone # [phone] [address] [place], [place] [address]

## 2018-09-29 ENCOUNTER — Encounter: Payer: Self-pay | Admitting: Cardiology

## 2018-09-29 NOTE — Assessment & Plan Note (Signed)
She was post that Dopplers done last year, they were not done.  With this and her subclavian artery stenosis, it would be reasonable to reassess.  We will reorder carotid Dopplers to be done in the spring.

## 2018-09-29 NOTE — Assessment & Plan Note (Signed)
She did have some potential evidence of spasm.  Was started on Imdur and now on amlodipine and this seems to have resolved her symptoms.  No change.

## 2018-09-29 NOTE — Assessment & Plan Note (Signed)
Nighttime palpitations after taking meds.  I would like for her to change taking metoprolol to dinnertime instead of nightly.  This will help reduce the palpitations.

## 2018-09-29 NOTE — Assessment & Plan Note (Signed)
Notable infarct on stress test evaluation, but improved EF.  Relook cath showed patent stents and negative FFR in the LAD.  No longer having any angina or heart failure symptoms.  Recovered well.

## 2018-09-29 NOTE — Assessment & Plan Note (Signed)
Is due for labs to be checked.  We will check lipids and chemistry panel.  Pending results may need to adjust her medication regimen.  She did not tolerate 80 of atorvastatin because of muscle aches.  I would therefore consider changing to 40 mg Crestor.

## 2018-09-29 NOTE — Assessment & Plan Note (Signed)
Stable blood pressure today.  Continue current dose of amlodipine and Toprol.

## 2018-09-29 NOTE — Assessment & Plan Note (Signed)
Will need to be assessed using carotid Dopplers as well.

## 2018-09-29 NOTE — Assessment & Plan Note (Signed)
Very tortuous RCA status post PCI.  Relook cath looked stable.  Due to the difficulty of RCA PCI, the plan was for her to stay on lifelong Plavix.  Interestingly though she is not no longer on it.  I do want her to restart back on aspirin.  Continue beta-blocker, statin and calcium channel blocker for antianginal effect.

## 2018-10-19 ENCOUNTER — Other Ambulatory Visit: Payer: Self-pay | Admitting: Cardiology

## 2018-10-25 ENCOUNTER — Other Ambulatory Visit: Payer: Self-pay | Admitting: Cardiology

## 2018-12-25 DIAGNOSIS — R7301 Impaired fasting glucose: Secondary | ICD-10-CM | POA: Diagnosis not present

## 2018-12-25 DIAGNOSIS — I10 Essential (primary) hypertension: Secondary | ICD-10-CM | POA: Diagnosis not present

## 2018-12-25 DIAGNOSIS — E7800 Pure hypercholesterolemia, unspecified: Secondary | ICD-10-CM | POA: Diagnosis not present

## 2018-12-25 DIAGNOSIS — Z79899 Other long term (current) drug therapy: Secondary | ICD-10-CM | POA: Diagnosis not present

## 2018-12-25 DIAGNOSIS — F419 Anxiety disorder, unspecified: Secondary | ICD-10-CM | POA: Diagnosis not present

## 2018-12-25 DIAGNOSIS — I2510 Atherosclerotic heart disease of native coronary artery without angina pectoris: Secondary | ICD-10-CM | POA: Diagnosis not present

## 2018-12-25 DIAGNOSIS — Z0001 Encounter for general adult medical examination with abnormal findings: Secondary | ICD-10-CM | POA: Diagnosis not present

## 2019-01-14 DIAGNOSIS — L400 Psoriasis vulgaris: Secondary | ICD-10-CM | POA: Diagnosis not present

## 2019-01-14 DIAGNOSIS — L03115 Cellulitis of right lower limb: Secondary | ICD-10-CM | POA: Diagnosis not present

## 2019-02-05 ENCOUNTER — Other Ambulatory Visit: Payer: Self-pay | Admitting: Family Medicine

## 2019-02-05 DIAGNOSIS — Z1231 Encounter for screening mammogram for malignant neoplasm of breast: Secondary | ICD-10-CM

## 2019-02-05 DIAGNOSIS — E2839 Other primary ovarian failure: Secondary | ICD-10-CM

## 2019-03-18 ENCOUNTER — Other Ambulatory Visit: Payer: Self-pay

## 2019-03-18 ENCOUNTER — Ambulatory Visit
Admission: RE | Admit: 2019-03-18 | Discharge: 2019-03-18 | Disposition: A | Discharge status: Home / Self Care | Payer: Medicare Other | Source: Ambulatory Visit | Attending: Family Medicine | Admitting: Family Medicine

## 2019-03-18 DIAGNOSIS — Z1231 Encounter for screening mammogram for malignant neoplasm of breast: Secondary | ICD-10-CM

## 2019-03-26 DIAGNOSIS — H43813 Vitreous degeneration, bilateral: Secondary | ICD-10-CM | POA: Diagnosis not present

## 2019-03-26 DIAGNOSIS — H40013 Open angle with borderline findings, low risk, bilateral: Secondary | ICD-10-CM | POA: Diagnosis not present

## 2019-03-26 DIAGNOSIS — H52203 Unspecified astigmatism, bilateral: Secondary | ICD-10-CM | POA: Diagnosis not present

## 2019-03-26 DIAGNOSIS — H18712 Corneal ectasia, left eye: Secondary | ICD-10-CM | POA: Diagnosis not present

## 2019-03-27 ENCOUNTER — Ambulatory Visit (HOSPITAL_COMMUNITY)
Admission: RE | Admit: 2019-03-27 | Discharge: 2019-03-27 | Disposition: A | Discharge status: Home / Self Care | Payer: Medicare Other | Source: Ambulatory Visit | Attending: Cardiology | Admitting: Cardiology

## 2019-03-27 ENCOUNTER — Other Ambulatory Visit: Payer: Self-pay

## 2019-03-27 ENCOUNTER — Other Ambulatory Visit (HOSPITAL_COMMUNITY): Payer: Self-pay | Admitting: Cardiology

## 2019-03-27 DIAGNOSIS — I6523 Occlusion and stenosis of bilateral carotid arteries: Secondary | ICD-10-CM | POA: Diagnosis not present

## 2019-03-27 DIAGNOSIS — I771 Stricture of artery: Secondary | ICD-10-CM

## 2019-04-01 ENCOUNTER — Telehealth: Payer: Self-pay | Admitting: *Deleted

## 2019-04-01 DIAGNOSIS — I6523 Occlusion and stenosis of bilateral carotid arteries: Secondary | ICD-10-CM

## 2019-04-01 DIAGNOSIS — I771 Stricture of artery: Secondary | ICD-10-CM

## 2019-04-01 DIAGNOSIS — R0989 Other specified symptoms and signs involving the circulatory and respiratory systems: Secondary | ICD-10-CM

## 2019-04-01 NOTE — Telephone Encounter (Signed)
Reviewed via mychart Order placed for 2 years

## 2019-04-01 NOTE — Telephone Encounter (Signed)
-----   Message from Leonie Man, MD sent at 03/31/2019  7:14 PM EDT ----- Heterogeneous plaque, bilaterally.  Stable 1-39% Right Internal Carotid A (RICA) stenosis. < 50% R Common Carotid (CCA)  Stable 30-94% LICA stenosi, per peak systolic velocity.  >50% bilateral External CA stenosis.  Elevated right subclavian artery velocities - suggest narrowing.  Patent vertebral arteries with antegrade flow.  --> Almost identical results to 2 years ago.  Can follow-up every 2 years.

## 2019-04-18 ENCOUNTER — Other Ambulatory Visit: Payer: Self-pay

## 2019-04-18 ENCOUNTER — Ambulatory Visit
Admission: RE | Admit: 2019-04-18 | Discharge: 2019-04-18 | Disposition: A | Discharge status: Home / Self Care | Payer: Medicare Other | Source: Ambulatory Visit | Attending: Family Medicine | Admitting: Family Medicine

## 2019-04-18 DIAGNOSIS — Z780 Asymptomatic menopausal state: Secondary | ICD-10-CM | POA: Diagnosis not present

## 2019-04-18 DIAGNOSIS — M85851 Other specified disorders of bone density and structure, right thigh: Secondary | ICD-10-CM | POA: Diagnosis not present

## 2019-04-18 DIAGNOSIS — E2839 Other primary ovarian failure: Secondary | ICD-10-CM

## 2019-06-17 DIAGNOSIS — Z124 Encounter for screening for malignant neoplasm of cervix: Secondary | ICD-10-CM | POA: Diagnosis not present

## 2019-06-17 DIAGNOSIS — Z6823 Body mass index (BMI) 23.0-23.9, adult: Secondary | ICD-10-CM | POA: Diagnosis not present

## 2019-06-19 DIAGNOSIS — M25531 Pain in right wrist: Secondary | ICD-10-CM | POA: Diagnosis not present

## 2019-06-26 DIAGNOSIS — M189 Osteoarthritis of first carpometacarpal joint, unspecified: Secondary | ICD-10-CM | POA: Diagnosis not present

## 2019-06-26 DIAGNOSIS — M180 Bilateral primary osteoarthritis of first carpometacarpal joints: Secondary | ICD-10-CM | POA: Diagnosis not present

## 2019-06-26 DIAGNOSIS — M79645 Pain in left finger(s): Secondary | ICD-10-CM | POA: Diagnosis not present

## 2019-06-26 DIAGNOSIS — M79644 Pain in right finger(s): Secondary | ICD-10-CM | POA: Diagnosis not present

## 2019-08-15 ENCOUNTER — Other Ambulatory Visit: Payer: Self-pay | Admitting: Cardiology

## 2019-08-19 ENCOUNTER — Other Ambulatory Visit: Payer: Self-pay | Admitting: Cardiology

## 2019-08-19 NOTE — Telephone Encounter (Signed)
*  STAT* If patient is at the pharmacy, call can be transferred to refill team.   1. Which medications need to be refilled? (please list name of each medication and dose if known) isosorbide mononitrate (IMDUR) 30 MG 24 hr tablet  2. Which pharmacy/location (including street and city if local pharmacy) is medication to be sent to? [facility] [phone] - [place], [place] - [address]  3. Do they need a 30 day or 90 day supply? 90 day   Patient is requesting the 30 MG be filled instead of 60 MG so it doesn't have to be cut in half.

## 2019-08-20 MED ORDER — ISOSORBIDE MONONITRATE ER 30 MG PO TB24: 30.0000 mg | ORAL_TABLET | Freq: Every day | ORAL | 1 refills | Status: DC

## 2019-09-23 ENCOUNTER — Telehealth: Payer: Self-pay | Admitting: Cardiology

## 2019-09-23 NOTE — Telephone Encounter (Signed)
  Pt called stating she has to have a note from cardiologist saying its ok for her to get the vaccine. Pt is getting vaccine Thursday 02/18 and requesting to send the note to her mychart account to print it out.

## 2019-09-24 NOTE — Telephone Encounter (Signed)
SPOKE WITH PATIENT  - SHE STATES SHE NEED A LETTER FOR [place]  TO HAVE COVID TEST   PATIENT IS DUE FOR VACCINE  ON Monday 09/29/19. PATIENT STATES REPRESENTATIVE STATES SHE NEEDS LETTER  BECAUSE SHE IS TAKING A BLODD THINNER.   INFORMED  PATIENT  WILL HAVE LETTER AVAILABLE SEND IT THROUGH MYCHART.   REVIEWED  MEDICATION LIST PATIENT IS NOT TAKING ANY TYPE OF BLOOD THINNER PATIENT DOES NT NEED  ALETTER .  PATIENT AWARE AND UNDERSTANDS.

## 2019-10-28 ENCOUNTER — Ambulatory Visit: Payer: Medicare Other | Attending: Internal Medicine

## 2019-10-28 DIAGNOSIS — Z23 Encounter for immunization: Secondary | ICD-10-CM

## 2019-10-28 NOTE — Progress Notes (Signed)
   Covid-19 Vaccination Clinic  Name:  FRANCHELLE FOSKETT    MRN: [id number] DOB: Mar 06, 1951  10/28/2019  Ms. Inthavong was observed post Covid-19 immunization for 15 minutes without incident. She was provided with Vaccine Information Sheet and instruction to access the V-Safe system.   Ms. Najarro was instructed to call 911 with any severe reactions post vaccine: Marland Kitchen Difficulty breathing  . Swelling of face and throat  . A fast heartbeat  . A bad rash all over body  . Dizziness and weakness   Immunizations Administered    Name Date Dose VIS Date Route   Pfizer COVID-19 Vaccine 10/28/2019  1:56 PM 0.3 mL 07/18/2019 Intramuscular   Manufacturer: [employer], [employer]   Lot: [id number]   NDC: [address]-1

## 2019-10-30 ENCOUNTER — Ambulatory Visit (INDEPENDENT_AMBULATORY_CARE_PROVIDER_SITE_OTHER): Payer: Medicare Other | Admitting: Cardiology

## 2019-10-30 ENCOUNTER — Other Ambulatory Visit: Payer: Self-pay

## 2019-10-30 VITALS — BP 136/68 | HR 65 | Ht 67.5 in | Wt 160.0 lb

## 2019-10-30 DIAGNOSIS — E785 Hyperlipidemia, unspecified: Secondary | ICD-10-CM

## 2019-10-30 DIAGNOSIS — I2119 ST elevation (STEMI) myocardial infarction involving other coronary artery of inferior wall: Secondary | ICD-10-CM | POA: Diagnosis not present

## 2019-10-30 DIAGNOSIS — I25111 Atherosclerotic heart disease of native coronary artery with angina pectoris with documented spasm: Secondary | ICD-10-CM

## 2019-10-30 DIAGNOSIS — I771 Stricture of artery: Secondary | ICD-10-CM

## 2019-10-30 DIAGNOSIS — R002 Palpitations: Secondary | ICD-10-CM | POA: Diagnosis not present

## 2019-10-30 DIAGNOSIS — I2510 Atherosclerotic heart disease of native coronary artery without angina pectoris: Secondary | ICD-10-CM

## 2019-10-30 DIAGNOSIS — Z9861 Coronary angioplasty status: Secondary | ICD-10-CM

## 2019-10-30 DIAGNOSIS — R0989 Other specified symptoms and signs involving the circulatory and respiratory systems: Secondary | ICD-10-CM

## 2019-10-30 DIAGNOSIS — I10 Essential (primary) hypertension: Secondary | ICD-10-CM

## 2019-10-30 MED ORDER — ASPIRIN EC 81 MG PO TBEC: 81.0000 mg | DELAYED_RELEASE_TABLET | Freq: Every day | ORAL | 3 refills | Status: AC

## 2019-10-30 NOTE — Patient Instructions (Signed)
Medication Instructions:  STOP TAKING IMDUR ( ISOSORBIDE MONO)    START ASPIRIN  81  MG ONE TABLET   *If you need a refill on your cardiac medications before your next appointment, please call your pharmacy*   Lab Work: NOT NEEDED   Testing/Procedures: NOT NEEDED   Follow-Up: At [facility], you and your health needs are our priority.  As part of our continuing mission to provide you with exceptional heart care, we have created designated Provider Care Teams.  These Care Teams include your primary Cardiologist (physician) and Advanced Practice Providers (APPs -  Physician Assistants and Nurse Practitioners) who all work together to provide you with the care you need, when you need it.    Your next appointment:   12 month(s)  The format for your next appointment:   In Person  Provider:   [name], MD   Other Instructions

## 2019-10-30 NOTE — Progress Notes (Signed)
Primary Care Provider: Lujean Amel, MD Cardiologist: Glenetta Hew, MD Electrophysiologist: None  Clinic Note: No chief complaint on file.   HPI:    Laura Herrera is a 69 y.o. female with a PMH below who presents today for annual follow-up for CAD.   CAD (s/p STEMI in 2016 with 100% occlusion of RCA with distal embolization (DES x2 placed -proximal to mid and ostial, not overlapping ->  with relook cath showing patent stents, patent by cath in 2017),   HTN, HLD  Laura Herrera was last seen on September 27, 2018 (after being seen twice in the fall 2019 by Jory Sims, NP for episode of what sounds like alcohol-related syncope and heat exhaustion) -> Imdur dose would cut back.  Beta-blocker was stopped.  Also Plavix was stopped. ->  Done N follow-up.  Blood pressures were stable. ->  At this visit, she felt much better.  And had "a stress with the palpitations but otherwise doing okay.  Had done well with weight loss (total of 40 g the time) was doing exercises, noted energy level improved.  Less frequent as needed Xanax  Recent Hospitalizations: None  Reviewed  CV studies:    The following studies were reviewed today: (if available, images/films reviewed: From Epic Chart or Care Everywhere) . Carotid Artery Duplex August 2020: R ICA 1-39%, R CCA<50%, R ECA> 50%.  LICA 40 -79%.  L CCA nonsignificant, L ECA> 50%.  Bilateral vertebral arteries normal.  Right subclavian stenotic, left subclavian patent (stable from 2018)   Interval History:   Laura Herrera returns here today again in great spirits.  She indicates that she is now 2 years into a new relationship and has a lot of positive feelings about this.  She has had less anxiety and stress.  She is happy that she has this relationship, because it made going through the Covid you better.  She does note that because of the restrictions on the other gym, she got out of her habit and gained about 16 pounds.  Despite that,  she still is active and denies any major symptoms.  She says every now and then maybe twice a week she will have short spell lasting less than an hour where she will feel palpitations.  Does not really feel is going fast or beating that irregular, just as though she may be going for a walk and she is not.  She does not feel lightheaded or dizzy just feels funny.  She has not had any chest pain or pressure with rest or exertion.  No heart failure symptoms.  Besides the fact that she is dealing with some discomfort from left leg/ankle cellulitis related to her psoriasis, she feels great today.  CV Review of Symptoms (Summary) Cardiovascular ROS: no chest pain or dyspnea on exertion positive for - palpitations and Can last up to an hour, but not overly symptomatic.  Not irregular negative for - edema, orthopnea, paroxysmal nocturnal dyspnea, shortness of breath or Syncope/near syncope, TIA/amaurosis fugax, claudication  The patient does not have symptoms concerning for COVID-19 infection (fever, chills, cough, or new shortness of breath).  The patient is practicing social distancing & Masking.    REVIEWED OF SYSTEMS   Review of Systems  Constitutional: Negative for chills, fever and malaise/fatigue.  HENT: Negative for congestion and nosebleeds.   Gastrointestinal: Negative for blood in stool and melena.  Genitourinary: Negative for hematuria.  Musculoskeletal: Positive for joint pain.  Skin: Positive for rash (Had  a little flareup of her psoriasis; left ankle cellulitis, doing better.).  Neurological: Negative for dizziness.  Psychiatric/Behavioral: Negative for memory loss. The patient is not nervous/anxious (Notably less).    Me I have reviewed and (if needed) personally updated the patient's problem list, medications, allergies, past medical and surgical history, social and family history.   PAST MEDICAL HISTORY   Past Medical History:  Diagnosis Date  . Anxiety   . CAD S/P  percutaneous coronary angioplasty    a. 01/2015 Inf STEMI/PCI: ostRCA 80% (Promus DES 2.75x12 - 3.1 mm), 100% & 70% p-mRCA covered with 1 DES (Promus DES 2.5x24  - 2.8 mm ), dRCA 90%(PTCA) -10% ;  b. 01/2015 Relook: LM nl, mLAD 20%, LCX nl- L->R collats, Thrombus in p-mRCA stent->Aggrastat;  c. 01/2015 Echo: EF 60-65%, no RWMA, mod AI, PAP . d. 11/2015: CATH: patent stents, FFR neg LM-LAD  . Chest pain with high risk for cardiac etiology 11/19/2015   Cardiac catheterization showed stable coronaries with patent RCA stents. FFR of LAD negative. --> LV gram EF suggested Takotsubo versus LAD spasm.  . Complication of anesthesia    pseudo cholinesterace deficiency  . Depression   . Dyslipidemia   . Headache    "used to have them daily; I no longer do" (11/09/2015)  . Hypertension   . Insomnia   . Kidney stones    "years ago; passed it"  . MRSA infection    h/o MRSA infection in the right foot  . Myocardial infarction (HCC) 01/2015  . NSTEMI (non-ST elevated myocardial infarction) (HCC) 11/09/2015  . PONV (postoperative nausea and vomiting)   . Pseudocholinesterase deficiency   . Psoriasis    hand and foot (11/09/2015)    PAST SURGICAL HISTORY   Past Surgical History:  Procedure Laterality Date  . ANGIOPLASTY  01/12/2015   Procedure: Angioplasty;  Surgeon: Lyn Records, MD;  Location: [facility] INVASIVE CV LAB;  Service: Cardiovascular;; Unable to wire RCA -- treated plaque protrusion in pRCA stent with Aggrastat infusion.  . APPENDECTOMY    . BREAST BIOPSY    . CARDIAC CATHETERIZATION N/A 01/11/2015   Procedure: Left Heart Cath and Coronary Angiography;  Surgeon: Marykay Lex, MD;  Location: [facility] INVASIVE CV LAB;  Service: Cardiovascular;  ost RCA 80% (calcified), pRCA 100% (followed by 70% tandem lesion), dRCA 90%.   Mild to moderate disease in LAD and circumflex  . CARDIAC CATHETERIZATION  01/11/2015   Procedure: Coronary Stent Intervention;  Surgeon: Marykay Lex, MD;  Location: [facility] INVASIVE CV  LAB;  Service: Cardiovascular;; ost RCA 80%- cutting balloon PTCA-> DES PCI (2.75 x 12 Promus DES - 3.1 mm); pRCA (covering tandem 100% -7-%) DES PCI (Promus DES 2.5 x 24 --> 2.8 mm)  . CARDIAC CATHETERIZATION  01/11/2015   Procedure: Coronary Balloon Angioplasty;  Surgeon: Marykay Lex, MD;  Location: [facility] INVASIVE CV LAB;  Service: Cardiovascular;;PTCA of dRCA 90% --> 10%.  . CARDIAC CATHETERIZATION N/A 01/12/2015   Procedure: Left Heart Cath and Coronary Angiography;  Surgeon: Lyn Records, MD;  Location: [facility] INVASIVE CV LAB;  Service: Cardiovascular;; Relook Cath for recurrent CP post STEMI PCI --> difficult to engage ostial RCA stent (unable to wire) - plaque protrusion in pRCA stent --> Rx with Aggrastat infusion.  Marland Kitchen CARDIAC CATHETERIZATION N/A 11/10/2015   Procedure: Left Heart Cath and Coronary Angiography;  Surgeon: Lennette Bihari, MD;  Location: [facility] INVASIVE CV LAB;  Service: Cardiovascular; patent RCA stents. Ostial LM 30%, ostial LAD 40% -  FFR negative. Moderate-severe LV dysfunction with hypo-kinesis involving the anterior wall to the apex. EF suggested 30%. (? Takotsubo)  . CARDIAC CATHETERIZATION N/A 11/10/2015   Procedure: Intravascular Pressure Wire/FFR Study;  Surgeon: Lennette Bihari, MD;  Location: [facility] INVASIVE CV LAB;  Service: Cardiovascular;  FFR of LAD negative.  Marland Kitchen DILATION AND CURETTAGE OF UTERUS  1979   S/P miscarriage  . INCISION AND DRAINAGE FOOT Right    "spider bite"  . NOSE SURGERY  X 2   "to remove genetic bone spur"  . TRANSTHORACIC ECHOCARDIOGRAM  11/11/2015   Normal LV size and function. Vigorous function with EF 6570%. GR 1 DD. Mild-moderate MR.   Diagnostic Angiogram April 2017; Negative FFR of Ostial LAD lesion.  Patent RCA stents    MEDICATIONS/ALLERGIES   Current Meds  Medication Sig  . acetaminophen (TYLENOL) 500 MG tablet Take 500 mg by mouth every 6 (six) hours as needed for headache.  . ALPRAZolam (XANAX) 0.25 MG tablet TAKE 1 TABLET TWICE A DAY AS NEEDED  FOR ANXIETY  . amLODipine (NORVASC) 5 MG tablet TAKE 1 TABLET BY MOUTH EVERY DAY  . atorvastatin (LIPITOR) 40 MG tablet TAKE 1 TABLET BY MOUTH EVERY DAY  . cephALEXin (KEFLEX) 500 MG capsule Take 500 mg by mouth 4 (four) times daily.  . cholecalciferol (VITAMIN D) 1000 UNITS tablet Take 1,000 Units by mouth daily.  . metoprolol succinate (TOPROL-XL) 25 MG 24 hr tablet Take 0.5 tablets (12.5 mg total) by mouth daily.  . nitroGLYCERIN (NITROSTAT) 0.4 MG SL tablet Place 1 tablet (0.4 mg total) under the tongue every 5 (five) minutes x 3 doses as needed for chest pain.  . pantoprazole (PROTONIX) 40 MG tablet TAKE 1 TABLET BY MOUTH DAILY AT 12 NOON.  Marland Kitchen triamcinolone cream (KENALOG) 0.1 % as needed.  . [DISCONTINUED] isosorbide mononitrate (IMDUR) 30 MG 24 hr tablet Take 1 tablet (30 mg total) by mouth daily.  . [DISCONTINUED] isosorbide mononitrate (IMDUR) 60 MG 24 hr tablet TAKE 1 TABLET BY MOUTH EVERY DAY    Allergies  Allergen Reactions  . 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  . Other Other (See Comments)    Sensitive to the electrodes  . Adhesive [Tape] Other (See Comments)    Tears skin off, and "paper" tape also  . Shellfish-Derived Products Swelling    Crabs, lobster, crayfish; tissues in joints swelled  . Epinephrine Palpitations    Palpitations and involuntary body jerking  . Vancomycin Hives and Rash    severe     SOCIAL HISTORY/FAMILY HISTORY   Reviewed in Epic:  Pertinent findings: - has new long-term boyfriend (~2 yrs into relationship).   OBJCTIVE -PE, EKG, labs   Wt Readings from Last 3 Encounters:  10/30/19 160 lb (72.6 kg)  09/27/18 140 lb 9.6 oz (63.8 kg)  04/29/18 139 lb 3.2 oz (63.1 kg)    Physical Exam: BP 140/68   Pulse 65   Ht 5' 7.5" (1.715 m)   Wt 160 lb (72.6 kg)   SpO2 98%   BMI 24.69 kg/m  Physical Exam  Constitutional: She is oriented to person, place,  and time. She appears well-developed and well-nourished. No distress.  Healthy-appearing.  Well-groomed.  HENT:  Head: Normocephalic and atraumatic.  Neck: No hepatojugular reflux and no JVD present. Carotid bruit is present (Left carotid bruit, right subclavian bruit both soft but audible).  Cardiovascular: Normal rate, regular rhythm, normal heart sounds and  intact distal pulses. Exam reveals no gallop and no friction rub.  No murmur heard. Pulmonary/Chest: Effort normal and breath sounds normal. No respiratory distress. She has no wheezes. She has no rales.  Abdominal: Soft. Bowel sounds are normal. She exhibits no distension. There is no abdominal tenderness. There is no rebound.  Musculoskeletal:        General: No edema. Normal range of motion.     Cervical back: Normal range of motion and neck supple.  Neurological: She is alert and oriented to person, place, and time.  Skin:  She has mild patch of psoriasis on her ankles.  Left ankle cellulitis area is minimally erythematous and not painful to touch.  Psychiatric: She has a normal mood and affect. Her behavior is normal. Judgment and thought content normal.  Vitals reviewed.    Adult ECG Report  Rate: 65;  Rhythm: normal sinus rhythm and Low voltage.  Inferior MI, age undetermined.  Otherwise normal axis, intervals and durations.;   Narrative Interpretation: Stable EKG  Recent Labs: Dec 25, 2018: TC 171, TG 177, HDL 87, LDL 49.  A1c 5.2, Hgb 13.9, Cr 1.11,K+ 4.7. Lab Results  Component Value Date   CHOL 183 01/10/2017   HDL 74 01/10/2017   LDLCALC 82 01/10/2017   TRIG 133 01/10/2017   CHOLHDL 2.5 01/10/2017   Lab Results  Component Value Date   CREATININE 0.81 01/10/2017   BUN 15 01/10/2017   NA 141 01/10/2017   K 4.9 01/10/2017   CL 107 (H) 01/10/2017   CO2 20 01/10/2017   Lab Results  Component Value Date   TSH 2.67 11/25/2015    ASSESSMENT/PLAN   Problem List Items Addressed This Visit    ST elevation  myocardial infarction (STEMI) of inferior wall, subsequent episode of care (HCC) (Chronic)    Clearing partner nonstress test evaluation but improved EF.  Patent stents by remote cath in 2017 with negative FFR of the LM-LAD. Thankfully, she is not having any further angina or heart failure symptoms.  Overall recovering well both from a physical and emotional standpoint.      Relevant Medications   aspirin EC 81 MG tablet   CAD S/P PCI of the ostial and mid/distal RCA - Primary (Chronic)    Difficult PCI in setting of STEMI with 2 different stents in the RCA.  Patent on relook cath. No longer on Plavix because of significant bruising.    Plan: We will restart 81 mg aspirin and determine how well she does.      Relevant Medications   aspirin EC 81 MG tablet   Other Relevant Orders   EKG 12-Lead   Coronary artery disease with angina pectoris with documented spasm (HCC) (Chronic)    Thankfully, no further angina last year.  Has not required any additional nitroglycerin.  Plan:  DC Imdur and continue amlodipine  Cc to be tolerating low-dose Toprol.  No further titration.  Continue statin.  Start 81 mg aspirin.      Relevant Medications   aspirin EC 81 MG tablet   Other Relevant Orders   EKG 12-Lead   Hyperlipidemia with target LDL less than 70 (Chronic)    Notable improvement of her lipids from 20 18-20 20.  LDL now down to 49 on most recent check.  Plan:   Continue atorvastatin 40 daily.    Due to have labs checked by PCP this summer      Relevant Medications   aspirin EC 81 MG tablet  Left carotid bruit (Chronic)    Stable moderate disease on Dopplers.  Can follow-up carotid duplex next year in August      Stenosis of right subclavian artery (HCC) (Chronic)    Asymptomatic.  No signs of subclavian steal or right arm claudication.  Continue to follow on carotid Dopplers.  As she has been stable, will simply follow-up in August 2022      Relevant Medications    aspirin EC 81 MG tablet   Heart palpitations (Chronic)    She still has palpitations, basically much improved.  Because of borderline bradycardia and history of orthostasis, not on further titrated Toprol.  She seems relatively stable and not overly symptomatic.  Probably more related to anxiety than true arrhythmia.      Relevant Orders   EKG 12-Lead   Essential hypertension (Chronic)    Blood pressure is little high today, but on my recheck was closer to what she gets at home which is in the 130 mmHg range.  She has a history of orthostatic symptoms with increased BP medications.  Would not further titrate amlodipine to metoprolol.  DC Imdur.      Relevant Medications   aspirin EC 81 MG tablet       COVID-19 Education: The signs and symptoms of COVID-19 were discussed with the patient and how to seek care for testing (follow up with PCP or arrange E-visit).   The importance of social distancing was discussed today.  I spent a total of 22 minutes with the patient. >  50% of the time was spent in direct patient consultation.  Additional time spent with chart review  / charting (studies, outside notes, etc): 8 Total Time: 30 min   Current medicines are reviewed at length with the patient today.  (+/- concerns) N/A  Notice: This dictation was prepared with Dragon dictation along with smaller phrase technology. Any transcriptional errors that result from this process are unintentional and may not be corrected upon review.  Patient Instructions / Medication Changes & Studies & Tests Ordered   Patient Instructions  Medication Instructions:  STOP TAKING IMDUR ( ISOSORBIDE MONO)    START ASPIRIN  81  MG ONE TABLET   *If you need a refill on your cardiac medications before your next appointment, please call your pharmacy*   Lab Work: NOT NEEDED   Testing/Procedures: NOT NEEDED   Follow-Up: At [facility], you and your health needs are our priority.  As part of our  continuing mission to provide you with exceptional heart care, we have created designated Provider Care Teams.  These Care Teams include your primary Cardiologist (physician) and Advanced Practice Providers (APPs -  Physician Assistants and Nurse Practitioners) who all work together to provide you with the care you need, when you need it.    Your next appointment:   12 month(s)  The format for your next appointment:   In Person  Provider:   [name], MD   Other Instructions     Studies Ordered:   Orders Placed This Encounter  Procedures  . EKG 12-Lead     [name], M.D., M.S. Interventional Cardiologist   Pager # [phone] Phone # [phone] [address] [place], [place] [address]   Thank you for choosing Heartcare at [facility]!!

## 2019-11-01 ENCOUNTER — Encounter: Payer: Self-pay | Admitting: Cardiology

## 2019-11-01 NOTE — Assessment & Plan Note (Addendum)
Difficult PCI in setting of STEMI with 2 different stents in the RCA.  Patent on relook cath. No longer on Plavix because of significant bruising.    Plan: We will restart 81 mg aspirin and determine how well she does.

## 2019-11-01 NOTE — Assessment & Plan Note (Signed)
Notable improvement of her lipids from 20 18-20 20.  LDL now down to 49 on most recent check.  Plan:   Continue atorvastatin 40 daily.    Due to have labs checked by PCP this summer

## 2019-11-01 NOTE — Assessment & Plan Note (Signed)
Asymptomatic.  No signs of subclavian steal or right arm claudication.  Continue to follow on carotid Dopplers.  As she has been stable, will simply follow-up in August 2022

## 2019-11-01 NOTE — Assessment & Plan Note (Signed)
Thankfully, no further angina last year.  Has not required any additional nitroglycerin.  Plan:  DC Imdur and continue amlodipine  Cc to be tolerating low-dose Toprol.  No further titration.  Continue statin.  Start 81 mg aspirin.

## 2019-11-01 NOTE — Assessment & Plan Note (Signed)
Blood pressure is little high today, but on my recheck was closer to what she gets at home which is in the 130 mmHg range.  She has a history of orthostatic symptoms with increased BP medications.  Would not further titrate amlodipine to metoprolol.  DC Imdur.

## 2019-11-01 NOTE — Assessment & Plan Note (Signed)
Clearing partner nonstress test evaluation but improved EF.  Patent stents by remote cath in 2017 with negative FFR of the LM-LAD. Thankfully, she is not having any further angina or heart failure symptoms.  Overall recovering well both from a physical and emotional standpoint.

## 2019-11-01 NOTE — Assessment & Plan Note (Signed)
She still has palpitations, basically much improved.  Because of borderline bradycardia and history of orthostasis, not on further titrated Toprol.  She seems relatively stable and not overly symptomatic.  Probably more related to anxiety than true arrhythmia.

## 2019-11-01 NOTE — Assessment & Plan Note (Signed)
Stable moderate disease on Dopplers.  Can follow-up carotid duplex next year in August

## 2019-11-13 ENCOUNTER — Other Ambulatory Visit: Payer: Self-pay | Admitting: Cardiology

## 2019-12-09 ENCOUNTER — Other Ambulatory Visit: Payer: Self-pay | Admitting: Cardiology

## 2019-12-09 MED ORDER — METOPROLOL SUCCINATE ER 25 MG PO TB24: 12.5000 mg | ORAL_TABLET | Freq: Every day | ORAL | 3 refills | Status: DC

## 2019-12-09 MED ORDER — AMLODIPINE BESYLATE 5 MG PO TABS: 5.0000 mg | ORAL_TABLET | Freq: Every day | ORAL | 3 refills | Status: DC

## 2019-12-09 MED ORDER — PANTOPRAZOLE SODIUM 40 MG PO TBEC: DELAYED_RELEASE_TABLET | ORAL | 3 refills | Status: AC

## 2019-12-09 NOTE — Telephone Encounter (Signed)
New message    *STAT* If patient is at the pharmacy, call can be transferred to refill team.   1. Which medications need to be refilled? (please list name of each medication and dose if known)pantoprazole (PROTONIX) 40 MG tablet   amLODipine (NORVASC) 5 MG tablet    metoprolol succinate (TOPROL-XL) 25 MG 24 hr tablet      2. Which pharmacy/location (including street and city if local pharmacy) is medication to be sent to? [facility] [phone] - [place],  - [address]  3. Do they need a 30 day or 90 day supply? 90

## 2019-12-31 DIAGNOSIS — Z0001 Encounter for general adult medical examination with abnormal findings: Secondary | ICD-10-CM | POA: Diagnosis not present

## 2019-12-31 DIAGNOSIS — I2510 Atherosclerotic heart disease of native coronary artery without angina pectoris: Secondary | ICD-10-CM | POA: Diagnosis not present

## 2019-12-31 DIAGNOSIS — F419 Anxiety disorder, unspecified: Secondary | ICD-10-CM | POA: Diagnosis not present

## 2019-12-31 DIAGNOSIS — I10 Essential (primary) hypertension: Secondary | ICD-10-CM | POA: Diagnosis not present

## 2020-02-20 ENCOUNTER — Other Ambulatory Visit: Payer: Self-pay | Admitting: Family Medicine

## 2020-02-20 DIAGNOSIS — Z1231 Encounter for screening mammogram for malignant neoplasm of breast: Secondary | ICD-10-CM

## 2020-03-10 ENCOUNTER — Other Ambulatory Visit: Payer: Self-pay | Admitting: Physician Assistant

## 2020-03-10 DIAGNOSIS — K222 Esophageal obstruction: Secondary | ICD-10-CM | POA: Diagnosis not present

## 2020-03-10 DIAGNOSIS — R1310 Dysphagia, unspecified: Secondary | ICD-10-CM | POA: Diagnosis not present

## 2020-03-10 DIAGNOSIS — R1319 Other dysphagia: Secondary | ICD-10-CM

## 2020-03-10 DIAGNOSIS — I2510 Atherosclerotic heart disease of native coronary artery without angina pectoris: Secondary | ICD-10-CM | POA: Diagnosis not present

## 2020-03-16 ENCOUNTER — Other Ambulatory Visit: Payer: Medicare Other

## 2020-03-22 ENCOUNTER — Other Ambulatory Visit: Payer: Self-pay

## 2020-03-22 ENCOUNTER — Ambulatory Visit
Admission: RE | Admit: 2020-03-22 | Discharge: 2020-03-22 | Disposition: A | Discharge status: Home / Self Care | Payer: Medicare Other | Source: Ambulatory Visit | Attending: Family Medicine | Admitting: Family Medicine

## 2020-03-22 DIAGNOSIS — Z1231 Encounter for screening mammogram for malignant neoplasm of breast: Secondary | ICD-10-CM | POA: Diagnosis not present

## 2020-03-29 ENCOUNTER — Ambulatory Visit
Admission: RE | Admit: 2020-03-29 | Discharge: 2020-03-29 | Disposition: A | Discharge status: Home / Self Care | Payer: Medicare Other | Source: Ambulatory Visit | Attending: Physician Assistant | Admitting: Physician Assistant

## 2020-03-29 DIAGNOSIS — K2100 Gastro-esophageal reflux disease with esophagitis, without bleeding: Secondary | ICD-10-CM | POA: Diagnosis not present

## 2020-03-29 DIAGNOSIS — R1319 Other dysphagia: Secondary | ICD-10-CM

## 2020-03-29 DIAGNOSIS — K224 Dyskinesia of esophagus: Secondary | ICD-10-CM | POA: Diagnosis not present

## 2020-04-07 DIAGNOSIS — L03116 Cellulitis of left lower limb: Secondary | ICD-10-CM | POA: Diagnosis not present

## 2020-05-06 DIAGNOSIS — Z1159 Encounter for screening for other viral diseases: Secondary | ICD-10-CM | POA: Diagnosis not present

## 2020-05-10 DIAGNOSIS — K319 Disease of stomach and duodenum, unspecified: Secondary | ICD-10-CM | POA: Diagnosis not present

## 2020-05-10 DIAGNOSIS — K2930 Chronic superficial gastritis without bleeding: Secondary | ICD-10-CM | POA: Diagnosis not present

## 2020-05-10 DIAGNOSIS — K3189 Other diseases of stomach and duodenum: Secondary | ICD-10-CM | POA: Diagnosis not present

## 2020-05-10 DIAGNOSIS — R1310 Dysphagia, unspecified: Secondary | ICD-10-CM | POA: Diagnosis not present

## 2020-05-10 DIAGNOSIS — K449 Diaphragmatic hernia without obstruction or gangrene: Secondary | ICD-10-CM | POA: Diagnosis not present

## 2020-05-10 DIAGNOSIS — K222 Esophageal obstruction: Secondary | ICD-10-CM | POA: Diagnosis not present

## 2020-05-12 DIAGNOSIS — L408 Other psoriasis: Secondary | ICD-10-CM | POA: Diagnosis not present

## 2020-05-12 DIAGNOSIS — L403 Pustulosis palmaris et plantaris: Secondary | ICD-10-CM | POA: Diagnosis not present

## 2020-05-14 DIAGNOSIS — K319 Disease of stomach and duodenum, unspecified: Secondary | ICD-10-CM | POA: Diagnosis not present

## 2020-05-14 DIAGNOSIS — K2930 Chronic superficial gastritis without bleeding: Secondary | ICD-10-CM | POA: Diagnosis not present

## 2020-08-31 ENCOUNTER — Other Ambulatory Visit: Payer: Self-pay | Admitting: Cardiology

## 2020-09-13 ENCOUNTER — Other Ambulatory Visit: Payer: Self-pay | Admitting: Cardiology

## 2020-09-23 DIAGNOSIS — K529 Noninfective gastroenteritis and colitis, unspecified: Secondary | ICD-10-CM | POA: Diagnosis not present

## 2021-01-04 DIAGNOSIS — I10 Essential (primary) hypertension: Secondary | ICD-10-CM | POA: Diagnosis not present

## 2021-01-04 DIAGNOSIS — F419 Anxiety disorder, unspecified: Secondary | ICD-10-CM | POA: Diagnosis not present

## 2021-01-04 DIAGNOSIS — Z0001 Encounter for general adult medical examination with abnormal findings: Secondary | ICD-10-CM | POA: Diagnosis not present

## 2021-01-04 DIAGNOSIS — E7800 Pure hypercholesterolemia, unspecified: Secondary | ICD-10-CM | POA: Diagnosis not present

## 2021-01-04 DIAGNOSIS — Z79899 Other long term (current) drug therapy: Secondary | ICD-10-CM | POA: Diagnosis not present

## 2021-01-26 DIAGNOSIS — L570 Actinic keratosis: Secondary | ICD-10-CM | POA: Diagnosis not present

## 2021-01-26 DIAGNOSIS — L821 Other seborrheic keratosis: Secondary | ICD-10-CM | POA: Diagnosis not present

## 2021-01-26 DIAGNOSIS — L820 Inflamed seborrheic keratosis: Secondary | ICD-10-CM | POA: Diagnosis not present

## 2021-01-26 DIAGNOSIS — L814 Other melanin hyperpigmentation: Secondary | ICD-10-CM | POA: Diagnosis not present

## 2021-02-17 DIAGNOSIS — L309 Dermatitis, unspecified: Secondary | ICD-10-CM | POA: Diagnosis not present

## 2021-02-17 DIAGNOSIS — L820 Inflamed seborrheic keratosis: Secondary | ICD-10-CM | POA: Diagnosis not present

## 2021-02-17 DIAGNOSIS — L821 Other seborrheic keratosis: Secondary | ICD-10-CM | POA: Diagnosis not present

## 2021-02-22 ENCOUNTER — Telehealth: Payer: Self-pay | Admitting: Cardiology

## 2021-02-22 NOTE — Telephone Encounter (Signed)
Pt returning your call states that it showed on caller id as scam likely.. please advise

## 2021-02-22 NOTE — Telephone Encounter (Signed)
    Pt c/o medication issue:  1. Name of Medication: amLODipine (NORVASC) 5 MG tablet  2. How are you currently taking this medication (dosage and times per day)? TAKE 1 TABLET(5 MG) BY MOUTH DAILY  3. Are you having a reaction (difficulty breathing--STAT)?   4. What is your medication issue? Terrible itching and red spots, after she got a new refill it was from different company, she called pharmacy and was told they ordered her a new refill from the old manufacturer but she will nit get in until Friday she wanted to know if ts ok to stop taking amlodipine for 3 days

## 2021-02-22 NOTE — Telephone Encounter (Signed)
Left a message for the patient to call back.  

## 2021-02-23 NOTE — Telephone Encounter (Signed)
Left message for patient okay to hold the amlodipine for the 3 days prior to getting new med. She may notice an increase in bp so to watch for that. She is to call back with questions.

## 2021-05-16 ENCOUNTER — Other Ambulatory Visit: Payer: Self-pay | Admitting: Family Medicine

## 2021-05-16 DIAGNOSIS — Z1231 Encounter for screening mammogram for malignant neoplasm of breast: Secondary | ICD-10-CM

## 2021-05-25 ENCOUNTER — Telehealth: Payer: Self-pay | Admitting: Cardiology

## 2021-05-25 ENCOUNTER — Other Ambulatory Visit: Payer: Self-pay | Admitting: Cardiology

## 2021-05-25 MED ORDER — AMLODIPINE BESYLATE 5 MG PO TABS: ORAL_TABLET | ORAL | 1 refills | Status: DC

## 2021-05-25 MED ORDER — METOPROLOL SUCCINATE ER 25 MG PO TB24: ORAL_TABLET | ORAL | 2 refills | Status: DC

## 2021-05-25 NOTE — Telephone Encounter (Signed)
*  STAT* If patient is at the pharmacy, call can be transferred to refill team.   1. Which medications need to be refilled? (please list name of each medication and dose if known)  amLODipine (NORVASC) 5 MG tablet  metoprolol succinate (TOPROL-XL) 25 MG 24 hr tablet   2. Which pharmacy/location (including street and city if local pharmacy) is medication to be sent to? [facility] [phone] - [place] [address], [place] - [address]  3. Do they need a 30 day or 90 day supply? 90 day   Patient states she only has enough medication for tonight and tomorrow night. She has an appointment 05/27/2021

## 2021-05-26 NOTE — Progress Notes (Signed)
Cardiology Office Note   Date:  05/27/2021   ID:  Laura Herrera, DOB 1951-01-18, MRN [id number]  PCP:  Darrow Bussing, MD   CC: Follow Up CAD     History of Present Illness: Laura Herrera is a 70 y.o. female who presents for ongoing assessment and management of coronary artery disease, with history of STEMI in 2016 with 100% occlusion of the right coronary artery and distal embolization.  The patient had drug-eluting stent x2 placed proximal to mid and ostial, nonoverlapping.  The patient had a relook cardiac catheterization showing patent stents in 2017.  Other history includes hypertension and hyperlipidemia.  She was last seen by Dr. Herbie Baltimore on 10/30/2019 and was doing well.  She had significant decrease in her anxiety and stress.  She had been going to the gym but with COVID restrictions had to cut back.  She did not complain of any cardiac symptoms.  She was restarted on aspirin 81 mg daily, isosorbide was discontinued amlodipine was continued along with low-dose Toprol.  She is here today for annual follow-up and refill on medications.  She comes today with a little anxiety but otherwise is doing well.  She has some external stresses which have caused her to be a little emotional on this office visit.  She denies any chest pain, shortness of breath, dizziness, or palpitations.  She has been medically compliant.  She was surprised how fast time has passed since being seen by cardiology.  She is here for refills.  Past Medical History:  Diagnosis Date   Anxiety    CAD S/P percutaneous coronary angioplasty    a. 01/2015 Inf STEMI/PCI: ostRCA 80% (Promus DES 2.75x12 - 3.1 mm), 100% & 70% p-mRCA covered with 1 DES (Promus DES 2.5x24  - 2.8 mm ), dRCA 90%(PTCA) -10% ;  b. 01/2015 Relook: LM nl, mLAD 20%, LCX nl- L->R collats, Thrombus in p-mRCA stent->Aggrastat;  c. 01/2015 Echo: EF 60-65%, no RWMA, mod AI, PAP . d. 11/2015: CATH: patent stents, FFR neg LM-LAD   Chest pain with high  risk for cardiac etiology 11/19/2015   Cardiac catheterization showed stable coronaries with patent RCA stents. FFR of LAD negative. --> LV gram EF suggested Takotsubo versus LAD spasm.   Complication of anesthesia    pseudo cholinesterace deficiency   Depression    Dyslipidemia    Headache    "used to have them daily; I no longer do" (11/09/2015)   Hypertension    Insomnia    Kidney stones    "years ago; passed it"   MRSA infection    h/o MRSA infection in the right foot   Myocardial infarction (HCC) 01/2015   NSTEMI (non-ST elevated myocardial infarction) (HCC) 11/09/2015   PONV (postoperative nausea and vomiting)    Pseudocholinesterase deficiency    Psoriasis    hand and foot (11/09/2015)    Past Surgical History:  Procedure Laterality Date   ANGIOPLASTY  01/12/2015   Procedure: Angioplasty;  Surgeon: Lyn Records, MD;  Location: [facility] INVASIVE CV LAB;  Service: Cardiovascular;; Unable to wire RCA -- treated plaque protrusion in pRCA stent with Aggrastat infusion.   APPENDECTOMY     BREAST BIOPSY     CARDIAC CATHETERIZATION N/A 01/11/2015   Procedure: Left Heart Cath and Coronary Angiography;  Surgeon: Marykay Lex, MD;  Location: [facility] INVASIVE CV LAB;  Service: Cardiovascular;  ost RCA 80% (calcified), pRCA 100% (followed by 70% tandem lesion), dRCA 90%.   Mild to moderate disease in  LAD and circumflex   CARDIAC CATHETERIZATION  01/11/2015   Procedure: Coronary Stent Intervention;  Surgeon: Marykay Lex, MD;  Location: [facility] INVASIVE CV LAB;  Service: Cardiovascular;; ost RCA 80%- cutting balloon PTCA-> DES PCI (2.75 x 12 Promus DES - 3.1 mm); pRCA (covering tandem 100% -7-%) DES PCI (Promus DES 2.5 x 24 --> 2.8 mm)   CARDIAC CATHETERIZATION  01/11/2015   Procedure: Coronary Balloon Angioplasty;  Surgeon: Marykay Lex, MD;  Location: [facility] INVASIVE CV LAB;  Service: Cardiovascular;;PTCA of dRCA 90% --> 10%.   CARDIAC CATHETERIZATION N/A 01/12/2015   Procedure: Left Heart Cath and Coronary  Angiography;  Surgeon: Lyn Records, MD;  Location: [facility] INVASIVE CV LAB;  Service: Cardiovascular;; Relook Cath for recurrent CP post STEMI PCI --> difficult to engage ostial RCA stent (unable to wire) - plaque protrusion in pRCA stent --> Rx with Aggrastat infusion.   CARDIAC CATHETERIZATION N/A 11/10/2015   Procedure: Left Heart Cath and Coronary Angiography;  Surgeon: Lennette Bihari, MD;  Location: [facility] INVASIVE CV LAB;  Service: Cardiovascular; patent RCA stents. Ostial LM 30%, ostial LAD 40% - FFR negative. Moderate-severe LV dysfunction with hypo-kinesis involving the anterior wall to the apex. EF suggested 30%. (? Takotsubo)   CARDIAC CATHETERIZATION N/A 11/10/2015   Procedure: Intravascular Pressure Wire/FFR Study;  Surgeon: Lennette Bihari, MD;  Location: [facility] INVASIVE CV LAB;  Service: Cardiovascular;  FFR of LAD negative.   DILATION AND CURETTAGE OF UTERUS  1979   S/P miscarriage   INCISION AND DRAINAGE FOOT Right    "spider bite"   NOSE SURGERY  X 2   "to remove genetic bone spur"   TRANSTHORACIC ECHOCARDIOGRAM  11/11/2015   Normal LV size and function. Vigorous function with EF 6570%. GR 1 DD. Mild-moderate MR.     Current Outpatient Medications  Medication Sig Dispense Refill   acetaminophen (TYLENOL) 500 MG tablet Take 500 mg by mouth every 6 (six) hours as needed for headache.     ALPRAZolam (XANAX) 0.25 MG tablet TAKE 1 TABLET TWICE A DAY AS NEEDED FOR ANXIETY  0   aspirin EC 81 MG tablet Take 1 tablet (81 mg total) by mouth daily. 90 tablet 3   cephALEXin (KEFLEX) 500 MG capsule Take 500 mg by mouth 4 (four) times daily.     cholecalciferol (VITAMIN D) 1000 UNITS tablet Take 1,000 Units by mouth daily.     nitroGLYCERIN (NITROSTAT) 0.4 MG SL tablet Place 1 tablet (0.4 mg total) under the tongue every 5 (five) minutes x 3 doses as needed for chest pain. 25 tablet 3   pantoprazole (PROTONIX) 40 MG tablet TAKE 1 TABLET BY MOUTH DAILY AT 12 NOON. 90 tablet 3   triamcinolone cream  (KENALOG) 0.1 % as needed.     amLODipine (NORVASC) 5 MG tablet TAKE 1 TABLET(5 MG) BY MOUTH DAILY 90 tablet 3   atorvastatin (LIPITOR) 40 MG tablet Take 1 tablet (40 mg total) by mouth daily. 90 tablet 3   metoprolol succinate (TOPROL-XL) 25 MG 24 hr tablet TAKE 1/2 TABLET(12.5 MG) BY MOUTH DAILY 45 tablet 2   No current facility-administered medications for this visit.    Allergies:   Cholinesterase inhibitors, Other, Adhesive [tape], Shellfish-derived products, Epinephrine, and Vancomycin    Social History:  The patient  reports that she has quit smoking. Her smoking use included cigarettes. She has a 0.20 pack-year smoking history. She has never used smokeless tobacco. She reports current alcohol use of about 2.0 standard drinks  per week. She reports that she does not use drugs.   Family History:  The patient's family history includes Alzheimer's disease in her father; Breast cancer in her maternal aunt; Breast cancer (age of onset: 89) in her maternal aunt; Heart Problems in her sister; Heart disease in her maternal grandmother; Hyperlipidemia in her brother and mother; Hypotension in her sister; Mental illness in her sister; Peripheral vascular disease in her mother; Schizophrenia in her sister.    ROS: All other systems are reviewed and negative. Unless otherwise mentioned in H&P    PHYSICAL EXAM: VS:  BP (!) 164/72   Pulse 79   Ht 5\' 8"  (1.727 m)   Wt 155 lb 6.4 oz (70.5 kg)   SpO2 98%   BMI 23.63 kg/m  , BMI Body mass index is 23.63 kg/m. GEN: Well nourished, well developed, in no acute distress HEENT: normal Neck: no JVD, bilateral carotid bruits, left greater than right , no masses Cardiac: RRR; 2/6 harsh systolic murmurs, rubs, or gallops,no edema  Respiratory:  Clear to auscultation bilaterally, normal work of breathing GI: soft, nontender, nondistended, + BS MS: no deformity or atrophy Skin: warm and dry, no rash Neuro:  Strength and sensation are intact Psych:  euthymic mood, full affect, tearful   EKG:  EKG is ordered today. The ekg ordered today demonstrates personally reviewed: Normal sinus rhythm rate of 79 bpm.   Recent Labs: No results found for requested labs within last 8760 hours.    Lipid Panel    Component Value Date/Time   CHOL 183 01/10/2017 1432   TRIG 133 01/10/2017 1432   HDL 74 01/10/2017 1432   CHOLHDL 2.5 01/10/2017 1432   CHOLHDL 2.0 11/10/2015 0520   VLDL 20 11/10/2015 0520   LDLCALC 82 01/10/2017 1432      Wt Readings from Last 3 Encounters:  05/27/21 155 lb 6.4 oz (70.5 kg)  10/30/19 160 lb (72.6 kg)  09/27/18 140 lb 9.6 oz (63.8 kg)      Other studies Reviewed: Echocardiogram 11-19-15 Left ventricle: The cavity size was normal. Wall thickness was    normal. Systolic function was vigorous. The estimated ejection    fraction was in the range of 65% to 70%. Doppler parameters are    consistent with abnormal left ventricular relaxation (grade 1    diastolic dysfunction).  - Aortic valve: There was mild to moderate regurgitation.  - Mitral valve: Calcified annulus. Mildly thickened leaflets .   Carotid Ultrasound: 03/27/2019 Summary:  Right Carotid: Velocities in the right ICA are consistent with a 1-39%  stenosis.                 Non-hemodynamically significant plaque <50% noted in the  CCA. The                 ECA appears >50% stenosed.   Left Carotid: Velocities in the left ICA are consistent with a 40-59%  stenosis.                Non-hemodynamically significant plaque noted in the CCA. The  ECA                appears >50% stenosed.   Vertebrals:  Bilateral vertebral arteries demonstrate antegrade flow.  Subclavians: Right subclavian artery was stenotic. Normal flow  hemodynamics were               seen in the left subclavian artery.   *See table(s) above for measurements and observations.  Suggest  follow up study in 12 months.    Diagnostic Angiogram April 2017; Negative FFR of Ostial  LAD lesion.  Patent RCA stents      ASSESSMENT AND PLAN:  1.  Coronary artery disease: No cardiac complaints today.  She is medically compliant and requests refills on her medications.  She has not needed to use any nitroglycerin and therefore this was not refilled.  She will continue her current medication regimen.  Continue enteric-coated aspirin 81 mg daily.  She is no longer on antiplatelet medication.  2.  Hypercholesterolemia: Labs are drawn by PCP.  Goal of LDL less than 70 and patient with CAD.  Continue atorvastatin 40 mg daily.  3.  Hypertension: Elevated today in the office.  She is emotional and tearful as she has had a very stressful morning and was a late coming to the appointment.  I will give her refills on amlodipine 5 mg daily, 90-day supply with refills along with metoprolol 25 mg 1/2 tablet by mouth daily as directed.  90 days with refills.  4.  Valvular heart disease with aortic valve regurgitation and calcified mitral valve annulus. Will repeat echo as she has not had this since 2017 with significant aortic valve murmur f and mitral valve disease or comparison of prior.  5.  Carotid bruit: Left greater than right.  Will need repeat Doppler ultrasound on follow-up appointment.   Current medicines are reviewed at length with the patient today.  I have spent 25 min's  dedicated to the care of this patient on the date of this encounter to include pre-visit review of records, assessment, management and diagnostic testing,with shared decision making.  Labs/ tests ordered today include: Echocardiogram   Bettey Mare. Liborio Nixon, ANP, AACC   05/27/2021 4:51 PM    [facility] [address] Office [phone] Fax [phone]  Notice: This dictation was prepared with Dragon dictation along with smaller phrase technology. Any transcriptional errors that result from this process are unintentional and may not be corrected upon review.

## 2021-05-27 ENCOUNTER — Encounter: Payer: Self-pay | Admitting: Adult Health

## 2021-05-27 ENCOUNTER — Other Ambulatory Visit: Payer: Self-pay

## 2021-05-27 ENCOUNTER — Ambulatory Visit: Payer: Medicare Other | Admitting: Adult Health

## 2021-05-27 VITALS — BP 164/72 | HR 79 | Ht 68.0 in | Wt 155.4 lb

## 2021-05-27 DIAGNOSIS — I351 Nonrheumatic aortic (valve) insufficiency: Secondary | ICD-10-CM | POA: Diagnosis not present

## 2021-05-27 DIAGNOSIS — I779 Disorder of arteries and arterioles, unspecified: Secondary | ICD-10-CM

## 2021-05-27 DIAGNOSIS — I2510 Atherosclerotic heart disease of native coronary artery without angina pectoris: Secondary | ICD-10-CM

## 2021-05-27 DIAGNOSIS — I25111 Atherosclerotic heart disease of native coronary artery with angina pectoris with documented spasm: Secondary | ICD-10-CM | POA: Diagnosis not present

## 2021-05-27 DIAGNOSIS — I358 Other nonrheumatic aortic valve disorders: Secondary | ICD-10-CM

## 2021-05-27 DIAGNOSIS — E7800 Pure hypercholesterolemia, unspecified: Secondary | ICD-10-CM

## 2021-05-27 DIAGNOSIS — I10 Essential (primary) hypertension: Secondary | ICD-10-CM

## 2021-05-27 MED ORDER — AMLODIPINE BESYLATE 5 MG PO TABS: ORAL_TABLET | ORAL | 3 refills | Status: DC

## 2021-05-27 MED ORDER — METOPROLOL SUCCINATE ER 25 MG PO TB24: ORAL_TABLET | ORAL | 2 refills | Status: DC

## 2021-05-27 MED ORDER — ATORVASTATIN CALCIUM 40 MG PO TABS: 40.0000 mg | ORAL_TABLET | Freq: Every day | ORAL | 3 refills | Status: DC

## 2021-05-27 NOTE — Patient Instructions (Signed)
Medication Instructions:  No Changes *If you need a refill on your cardiac medications before your next appointment, please call your pharmacy*   Lab Work: No Labs If you have labs (blood work) drawn today and your tests are completely normal, you will receive your results only by: MyChart Message (if you have MyChart) OR A paper copy in the mail If you have any lab test that is abnormal or we need to change your treatment, we will call you to review the results.   Testing/Procedures: [address]. Your physician has requested that you have an echocardiogram. Echocardiography is a painless test that uses sound waves to create images of your heart. It provides your doctor with information about the size and shape of your heart and how well your heart's chambers and valves are working. This procedure takes approximately one hour. There are no restrictions for this procedure.    Follow-Up: At [facility], you and your health needs are our priority.  As part of our continuing mission to provide you with exceptional heart care, we have created designated Provider Care Teams.  These Care Teams include your primary Cardiologist (physician) and Advanced Practice Providers (APPs -  Physician Assistants and Nurse Practitioners) who all work together to provide you with the care you need, when you need it.    Your next appointment:   1 year(s)  The format for your next appointment:   In Person  Provider:   [name], MD   Other Instructions Please Fax Labs done by Primary Care Provider. Our Office Fax [phone]

## 2021-05-30 DIAGNOSIS — M13842 Other specified arthritis, left hand: Secondary | ICD-10-CM | POA: Diagnosis not present

## 2021-05-30 DIAGNOSIS — M13841 Other specified arthritis, right hand: Secondary | ICD-10-CM | POA: Diagnosis not present

## 2021-06-02 ENCOUNTER — Telehealth: Payer: Self-pay | Admitting: Cardiology

## 2021-06-02 NOTE — Telephone Encounter (Signed)
Laura Herrera is calling wanting to know what pain medications she can take for her arthritis in her hands due to the bone spurs on her thumbs. She states it's gotten to the point she cant open a jar.

## 2021-06-03 NOTE — Telephone Encounter (Signed)
Patient not on any blood thinners or ACE/ARB for hypertension.  Can try NSAIDs such as ibuprofen or naproxen.  However last Scr 1.33 in May 2022 so recommend she use the smallest dose for the shortest period of time.

## 2021-06-03 NOTE — Telephone Encounter (Signed)
Left message for pt to call back  °

## 2021-06-06 NOTE — Telephone Encounter (Signed)
Pt updated and verbalized understanding.  

## 2021-06-06 NOTE — Telephone Encounter (Signed)
Patient returning call.

## 2021-06-07 DIAGNOSIS — I351 Nonrheumatic aortic (valve) insufficiency: Secondary | ICD-10-CM

## 2021-06-07 HISTORY — DX: Nonrheumatic aortic (valve) insufficiency: I35.1

## 2021-06-10 ENCOUNTER — Ambulatory Visit: Payer: Medicare Other

## 2021-06-13 ENCOUNTER — Other Ambulatory Visit: Payer: Self-pay

## 2021-06-13 ENCOUNTER — Ambulatory Visit (HOSPITAL_COMMUNITY): Payer: Medicare Other | Attending: Cardiology

## 2021-06-13 DIAGNOSIS — I351 Nonrheumatic aortic (valve) insufficiency: Secondary | ICD-10-CM | POA: Insufficient documentation

## 2021-06-13 HISTORY — PX: TRANSTHORACIC ECHOCARDIOGRAM: SHX275

## 2021-06-14 LAB — ECHOCARDIOGRAM COMPLETE
Area-P 1/2: 2.42 cm2
MV VTI: 1.41 cm2
P 1/2 time: 472 msec
S' Lateral: 4 cm

## 2021-06-16 DIAGNOSIS — Z79899 Other long term (current) drug therapy: Secondary | ICD-10-CM | POA: Diagnosis not present

## 2021-06-16 DIAGNOSIS — L308 Other specified dermatitis: Secondary | ICD-10-CM | POA: Diagnosis not present

## 2021-06-17 ENCOUNTER — Telehealth: Payer: Self-pay | Admitting: Cardiology

## 2021-06-17 NOTE — Telephone Encounter (Signed)
Hydroxyzine fine with her heart meds.  May cause her to be more drowsy if mixed with alprazolam.

## 2021-06-17 NOTE — Telephone Encounter (Signed)
Spoke with pt,aware of review.

## 2021-06-17 NOTE — Telephone Encounter (Signed)
Will forward to pharm md to review 

## 2021-06-17 NOTE — Telephone Encounter (Signed)
  Pt c/o medication issue:  1. Name of Medication: hydroxyzine  2. How are you currently taking this medication (dosage and times per day)?   3. Are you having a reaction (difficulty breathing--STAT)?   4. What is your medication issue? Pt was prescribed this meds for her allergies, and she saw the pre-caution about heart issue and heart meds, she wanted to know if its ok for her to use hydroxyzine

## 2021-06-21 DIAGNOSIS — Z124 Encounter for screening for malignant neoplasm of cervix: Secondary | ICD-10-CM | POA: Diagnosis not present

## 2021-06-21 DIAGNOSIS — Z6825 Body mass index (BMI) 25.0-25.9, adult: Secondary | ICD-10-CM | POA: Diagnosis not present

## 2021-06-24 ENCOUNTER — Telehealth: Payer: Self-pay

## 2021-06-24 NOTE — Telephone Encounter (Addendum)
Called patient to schedule appointment for follow up with Dr. Herbie Baltimore. Patient scheduled for 11/22 4:20 PM----- Message from Jodelle Gross, NP sent at 06/23/2021  8:39 AM EST ----- Please have her scheduled for appointment with me or Dr Herbie Baltimore to discuss echocardiogram results. (She needs a TEE).   Laura Herrera

## 2021-06-24 NOTE — Telephone Encounter (Signed)
Patient returned call

## 2021-06-24 NOTE — Telephone Encounter (Addendum)
Called patient left message for patient to call office.----- Message from Jodelle Gross, NP sent at 06/23/2021  8:39 AM EST ----- Please have her scheduled for appointment with me or Dr Herbie Baltimore to discuss echocardiogram results. (She needs a TEE).   Laura Herrera

## 2021-06-28 ENCOUNTER — Encounter: Payer: Self-pay | Admitting: Cardiology

## 2021-06-28 ENCOUNTER — Other Ambulatory Visit: Payer: Self-pay

## 2021-06-28 ENCOUNTER — Ambulatory Visit: Payer: Medicare Other | Admitting: Cardiology

## 2021-06-28 VITALS — BP 150/60 | HR 74 | Ht 68.5 in | Wt 165.8 lb

## 2021-06-28 DIAGNOSIS — I25111 Atherosclerotic heart disease of native coronary artery with angina pectoris with documented spasm: Secondary | ICD-10-CM

## 2021-06-28 DIAGNOSIS — I2119 ST elevation (STEMI) myocardial infarction involving other coronary artery of inferior wall: Secondary | ICD-10-CM

## 2021-06-28 DIAGNOSIS — Z9861 Coronary angioplasty status: Secondary | ICD-10-CM

## 2021-06-28 DIAGNOSIS — I771 Stricture of artery: Secondary | ICD-10-CM | POA: Diagnosis not present

## 2021-06-28 DIAGNOSIS — I351 Nonrheumatic aortic (valve) insufficiency: Secondary | ICD-10-CM

## 2021-06-28 DIAGNOSIS — E785 Hyperlipidemia, unspecified: Secondary | ICD-10-CM

## 2021-06-28 DIAGNOSIS — I2510 Atherosclerotic heart disease of native coronary artery without angina pectoris: Secondary | ICD-10-CM

## 2021-06-28 DIAGNOSIS — I10 Essential (primary) hypertension: Secondary | ICD-10-CM

## 2021-06-28 DIAGNOSIS — R002 Palpitations: Secondary | ICD-10-CM

## 2021-06-28 NOTE — Patient Instructions (Signed)
Medication Instructions:   No changes  *If you need a refill on your cardiac medications before your next appointment, please call your pharmacy*   Lab Work: Not needed    Testing/Procedures:  Not needed  Follow-Up: At [facility], you and your health needs are our priority.  As part of our continuing mission to provide you with exceptional heart care, we have created designated Provider Care Teams.  These Care Teams include your primary Cardiologist (physician) and Advanced Practice Providers (APPs -  Physician Assistants and Nurse Practitioners) who all work together to provide you with the care you need, when you need it.     Your next appointment:   7 month(s)  The format for your next appointment:   In Person  Provider:   [name], MD    Other Instructions    Random blood pressures log  for several weeks  you can send information through  MyChart if you like.

## 2021-06-28 NOTE — Progress Notes (Signed)
Primary Care Provider: Lujean Amel, MD Cardiologist: Glenetta Hew, MD Electrophysiologist: None  Clinic Note: Chief Complaint  Patient presents with   Follow-up    1 month   Coronary Artery Disease    Inferior STEMI June 2016 with RCA PCI.   Hypertension    ===================================  ASSESSMENT/PLAN   Problem List Items Addressed This Visit       Cardiology Problems   ST elevation myocardial infarction (STEMI) of inferior wall, subsequent episode of care ([place]) (Chronic)    No 5-1/2 years from her STEMI.  Potential treatment related The next morning also 1 year later.  She had an LAD lesion evaluated with plans ischemic.  No further anginal symptoms with no need for further evaluation since. No notable wall motion irregularities on echo.       CAD S/P PCI of the ostial and mid/distal RCA (Chronic)    Difficult PCI of the ostial and mid RCA.  Very tortuous.  Now with ostial RCA stent, difficult to engage.  Patent stents in 2017.  No further angina.  She does have LAD lesion that is being monitored for symptoms.  Was FFR negative in 2017. Has been unable to tolerate higher doses of Toprol due to fatigue and bradycardia, now on stable dose of 12.5 mg Toprol.  Also on moderate dose amlodipine. Not currently on ARB/ACE inhibitor.  If pressures doing increase, would probably start low-dose ARB. On stable dose of statin.  Labs borderline at goal. On aspirin without Plavix.  Plavix stopped because of significant bruising.      Coronary artery disease with angina pectoris with documented spasm (HCC) (Chronic)    ACS/anginal episode in 2017 thought to be related to coronary spasm.  Has been on Imdur and amlodipine.  Imdur discontinued in 2021.  Continued on amlodipine. On amlodipine preferred over ARB because of concern for spasm.  Seems relatively stable.      Hyperlipidemia with target LDL less than 70 (Chronic)    Labs are pretty borderline controlled in May  of this year.  LDL was 72.  Would like it to be less than 55, but least less than 70 is reasonable.  She should be due for recheck soon.  She is now taking 40 mg atorvastatin.  Labs are usually followed by PCP in the summertime.  I will see her back after her labs checked by PCP are completed.      Stenosis of right subclavian artery (HCC) (Chronic)    Asymptomatic.  No signs of subclavian steal.  She was due to Dopplers done this year.  If not done by the time I see her back, can reorder      Essential hypertension (Chronic)    Blood pressure seems high today.  I have asked that she keep a blood pressure log and we can reassess in follow-up.  She does have room to titrate up the amlodipine as well as Toprol.  We also potentially consider ARB. With her having AI, blood pressure control is somewhat important and therefore I would like her to monitor pressures and we can reassess in July.      Aortic valve insufficiency - Primary (Chronic)    Most recent echo seem to overcall the extent of aortic insufficiency has been moderately severe.  I reviewed the images with Dr. Sallyanne Kuster only postop with it is probably more consistent with mild to moderate aortic insufficiency.  Would need to follow-up an echo next year.  Can be ordered  at follow-up visit.  Important treatment his blood pressure and rate control.        Other   Heart palpitations (Chronic)    Palpitations Dr. Quay Burow very well controlled on low-dose Toprol.  Mostly related to anxiety.       ===================================  HPI:    Laura Herrera is a 70 y.o. female with a PMH notable for CAD-PCI (inferior STEMI, DES PCI to RCA x2), HTN, HLD, GAD, Mild to Moderate Aortic Valve Disease who presents today for 1 month follow-up to discuss results of several studies..  I last saw Laura Herrera in March 2021.  She is doing well.  In great spirits.  Was in a positive relationship.  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   Unfortunately gained about 16 pounds.  Was noting some palpitations but no real chest pain.  She noted increased psoriasis flareup. Continued on aspirin alone without Plavix.  No further bruising. Imdur discontinued and amlodipine continued.  Continued low-dose Toprol (with palpitations being well controlled).  Put back on aspirin along with statin. Carotid Dopplers scheduled for August 2022-right subclavian artery stenosis)  Laura Herrera was last seen on May 28, 2019 by Jory Sims, NP.  She did have some anxiety but otherwise doing well.  Had some external stressors that made her somewhat emotional.  At that time she denies any chest pain or pressure, dyspnea at rest or exertion.  Medically compliant.  Had realized that she was on overdue for visit and needed medication refills. => Blood pressures were elevated as she was very stressed left shoulder in the clinic visit.   Amlodipine 5 mg was refilled along with  12.5 mg Toprol. 2D echo ordered to reassess aortic valve. Carotid Dopplers ordered to evaluate carotid bruit.  Recent Hospitalizations: None  Reviewed  CV studies:    The following studies were reviewed today: (if available, images/films reviewed: From Epic Chart or Care Everywhere) 2D Echo 06/13/2021: EF 60 to 65%.  No R WMA.  Unable to assess diastolic function.  Normal RV size and function.  Normal pressures.  Severe MAC.  Moderate to severe aortic regurgitation.  Normal RAP. => AI has progressed I personally reviewed these films and discussed with Dr. Sallyanne Kuster.  He felt that the AORTIC INSUFFICIENCY WAS MILD TO MODERATE and not moderate to severe.   Interval History:   Laura Herrera returns here today for 1 month follow-up to discuss results of her echocardiogram.  She actually is doing remarkably well.  She is still somewhat stressed, but is not complaining of any resting exertional dyspnea.  No PND, orthopnea or edema.  No irregular heartbeats or palpitations.  No  syncope/near syncope or TIA/amaurosis fugax.  She had 1 day where she had some chest discomfort that lasted for few minutes, and occasionally has some shortness of breath if she pushes her self. She is having hard time with her anxiety and stress levels.  Blood pressures been running a little high.  CV Review of Symptoms (Summary) Cardiovascular ROS: positive for - -1 episode of chest pain and occasional exertional dyspnea.  Otherwise stable negative for - edema, irregular heartbeat, orthopnea, palpitations, paroxysmal nocturnal dyspnea, rapid heart rate, shortness of breath, or lightheadedness, dizziness or wooziness, syncope/near syncope or TIA/amaurosis fugax, claudication  REVIEWED OF SYSTEMS   Review of Systems  Constitutional:  Negative for malaise/fatigue and weight loss (Her weight has been somewhat labile, but now.).  HENT:  Negative for nosebleeds.   Respiratory:  Negative for  cough and shortness of breath.   Cardiovascular:        Per HPI  Gastrointestinal:  Negative for blood in stool and melena.  Genitourinary:  Negative for hematuria.  Musculoskeletal:  Positive for joint pain (Off and on.  She does have some mild component of psoriatic arthritis.).  Neurological:  Negative for dizziness and weakness.  Psychiatric/Behavioral:  Negative for depression and memory loss. The patient is nervous/anxious. The patient does not have insomnia.    I have reviewed and (if needed) personally updated the patient's problem list, medications, allergies, past medical and surgical history, social and family history.   PAST MEDICAL HISTORY   Past Medical History:  Diagnosis Date   Aortic valve insufficiency 06/2021   Echo read as normal EF, normal wall motions.  Severe MAC.  Moderate to severe AI.=> On review with imaging cardiologist, this appears to be an over estimation.  More consistent with mild to moderate AI.   CAD S/P percutaneous coronary angioplasty 01/2015   a. 01/2015 Inf  STEMI/PCI: ostRCA 80% (Promus DES 2.75x12 - 3.1 mm), 100% & 70% p-mRCA covered with 1 DES (Promus DES 2.5x24  - 2.8 mm ), dRCA 90%(PTCA) -10% ;  b. 01/2015 Relook: LM nl, mLAD 20%, LCX nl- L->R collats, Thrombus in p-mRCA stent->Aggrastat;  c. 01/2015 Echo: EF 60-65%, no RWMA, mod AI, PAP 41mHg. d. 11/2015: CATH: patent stents, FFR neg LM-LAD   Chest pain with high risk for cardiac etiology 11/19/2015   Cardiac catheterization showed stable coronaries with patent RCA stents. FFR of LAD negative. --> LV gram EF suggested Takotsubo versus LAD spasm.   Complication of anesthesia    pseudo cholinesterace deficiency   Depression    Dyslipidemia    Generalized anxiety disorder    Currently being treated only with PRN Xanax for panic attacks.   Headache    "used to have them daily; I no longer do" (11/09/2015)   Hypertension    Insomnia    Kidney stones    "years ago; passed it"   MRSA infection    h/o MRSA infection in the right foot   [place] ([place]-ST elevated [place]) [place] [place]   No obvious culprit lesion found.  Thought to be potentially Takotsubo.   PONV (postoperative nausea and vomiting)    Pseudocholinesterase deficiency    Psoriasis    hand and foot (11/09/2015)   ST elevation myocardial infarction (STEMI) of inferior wall ([facility] 01/2015   Severe RCA disease.  Ostial to proximal.  Single stent with PTCA of distal RCA and the PDA..Marland Kitchen   PAST SURGICAL HISTORY   Past Surgical History:  Procedure Laterality Date   ANGIOPLASTY  01/12/2015   Procedure: Angioplasty;  Surgeon: HBelva Crome MD;  [facility];  Service: Cardiovascular;; Unable to wire RCA -- treated plaque protrusion in pRCA stent with Aggrastat infusion.   APPENDECTOMY     BREAST BIOPSY     CARDIAC CATHETERIZATION N/A 01/11/2015   Procedure: Left Heart Cath and Coronary Angiography;  Surgeon: DLeonie Man MD;  [facility];  Service: Cardiovascular;  ost RCA 80%  (calcified), pRCA 100% (followed by 70% tandem lesion), dRCA 90%.   Mild to moderate disease in LAD and circumflex   CARDIAC CATHETERIZATION  01/11/2015   Procedure: Coronary Stent Intervention;  Surgeon: DLeonie Man MD;  [facility];  Service: Cardiovascular;; ost RCA 80%- cutting balloon PTCA-> DES PCI (2.75 x 12 Promus DES -  3.1 mm); pRCA (covering tandem 100% -7-%) DES PCI (Promus DES 2.5 x 24 --> 2.8 mm)   CARDIAC CATHETERIZATION  01/11/2015   Procedure: Coronary Balloon Angioplasty;  Surgeon: Leonie Man, MD;  Location: [place] [facility];  Service: Cardiovascular;;PTCA of dRCA 90% --> 10%.   CARDIAC CATHETERIZATION N/A 01/12/2015   Procedure: Left Heart Cath and Coronary Angiography;  Surgeon: Belva Crome, MD;  Location: [place] [facility];  Service: Cardiovascular;; Relook Cath for recurrent CP post STEMI PCI --> difficult to engage ostial RCA stent (unable to wire) - plaque protrusion in pRCA stent --> Rx with Aggrastat infusion.   CARDIAC CATHETERIZATION N/A 11/10/2015   Procedure: Left Heart Cath and Coronary Angiography;  Surgeon: Troy Sine, MD;  Location: [place] [facility];  Service: Cardiovascular; patent RCA stents. Ostial LM 30%, ostial LAD 40% - FFR negative. Moderate-severe LV dysfunction with hypo-kinesis involving the anterior wall to the apex. EF suggested 30%. (? Takotsubo)   CARDIAC CATHETERIZATION N/A 11/10/2015   Procedure: Intravascular Pressure Wire/FFR Study;  Surgeon: Troy Sine, MD;  Location: [place] [place] [facility];  Service: Cardiovascular;  FFR of LAD negative.   DILATION AND CURETTAGE OF UTERUS  1979   S/P miscarriage   INCISION AND DRAINAGE FOOT Right    "spider bite"   NOSE SURGERY  X 2   "to remove genetic bone spur"   TRANSTHORACIC ECHOCARDIOGRAM  11/11/2015   Normal LV size and function. Vigorous function with EF 6570%. GR 1 DD. Mild-moderate MR.   TRANSTHORACIC ECHOCARDIOGRAM  06/13/2021   EF 60 to 65%.  No R WMA.  Unable  to assess EF.  Normal RV size function.  Normal RV pressures.  Severe MAC with no MR or MS. "  Moderate-severe" AI - overread as Mild-Moderate (Dr. Sallyanne Kuster).   Diagnostic Angiogram April 2017; Negative FFR of Ostial LAD lesion.  Patent RCA stents    Immunization History  Administered Date(s) Administered   PFIZER(Purple Top)SARS-COV-2 Vaccination 10/28/2019    MEDICATIONS/ALLERGIES   Current Meds  Medication Sig   acetaminophen (TYLENOL) 500 MG tablet Take 500 mg by mouth every 6 (six) hours as needed for headache.   ALPRAZolam (XANAX) 0.25 MG tablet TAKE 1 TABLET TWICE A DAY AS NEEDED FOR ANXIETY   amLODipine (NORVASC) 5 MG tablet TAKE 1 TABLET(5 MG) BY MOUTH DAILY   aspirin EC 81 MG tablet Take 1 tablet (81 mg total) by mouth daily.   atorvastatin (LIPITOR) 40 MG tablet Take 1 tablet (40 mg total) by mouth daily.   cephALEXin (KEFLEX) 500 MG capsule Take 500 mg by mouth 4 (four) times daily.   cholecalciferol (VITAMIN D) 1000 UNITS tablet Take 1,000 Units by mouth daily.   metoprolol succinate (TOPROL-XL) 25 MG 24 hr tablet TAKE 1/2 TABLET(12.5 MG) BY MOUTH DAILY   nitroGLYCERIN (NITROSTAT) 0.4 MG SL tablet Place 1 tablet (0.4 mg total) under the tongue every 5 (five) minutes x 3 doses as needed for chest pain.   pantoprazole (PROTONIX) 40 MG tablet TAKE 1 TABLET BY MOUTH DAILY AT 12 NOON.   triamcinolone cream (KENALOG) 0.1 % as needed.    Allergies  Allergen Reactions   Cholinesterase Inhibitors Other (See Comments)    Enzyme deficiency which causes muscle paralyses after receiving anesthesia.   Other Other (See Comments) and Rash    Sensitive to the electrodes Tears skin off, and "paper" tape also   Adhesive [Tape] Other (See Comments)    Tears skin off, and "paper"  tape also   Shellfish-Derived Products Swelling    Crabs, lobster, crayfish; tissues in joints swelled   Epinephrine Palpitations    Palpitations and involuntary body jerking   Vancomycin Hives and Rash     severe     SOCIAL HISTORY/FAMILY HISTORY   Reviewed in Epic:  Pertinent findings:  Social History   Tobacco Use   Smoking status: Former    Packs/day: 0.10    Years: 2.00    Pack years: 0.20    Types: Cigarettes   Smokeless tobacco: Never   Tobacco comments:    "quit smoking in the 1980s"  Substance Use Topics   Alcohol use: Yes    Alcohol/week: 2.0 standard drinks    Types: 2 Glasses of wine per week   Drug use: No   Social History   Social History Narrative   She is under a significant amount of stress socially.    Her husband committed suicide several years ago - she continues to have difficulty with coping.    OBJCTIVE -PE, EKG, labs   Wt Readings from Last 3 Encounters:  06/28/21 165 lb 12.8 oz (75.2 kg)  05/27/21 155 lb 6.4 oz (70.5 kg)  10/30/19 160 lb (72.6 kg)    Physical Exam: BP (!) 150/60 (BP Location: Left Arm)   Pulse 74   Ht 5' 8.5" (1.74 m)   Wt 165 lb 12.8 oz (75.2 kg)   SpO2 98%   BMI 24.84 kg/m  Physical Exam Vitals reviewed.  Constitutional:      General: She is not in acute distress.    Appearance: Normal appearance. She is normal weight. She is not toxic-appearing or diaphoretic.  HENT:     Head: Normocephalic and atraumatic.  Neck:     Vascular: No carotid bruit (Radiates error,.  Also subclavian bruit), hepatojugular reflux or JVD.  Cardiovascular:     Rate and Rhythm: Normal rate and regular rhythm. Occasional Extrasystoles are present.    Chest Wall: PMI is not displaced.     Pulses: Normal pulses.     Heart sounds: S1 normal and S2 normal. Murmur heard.  Harsh crescendo-decrescendo midsystolic murmur is present with a grade of 2/6 at the upper right sternal border radiating to the neck.  Low-pitched blowing early diastolic murmur is present with a grade of 1/4 at the upper right sternal border and upper left sternal border.    No friction rub. No gallop.  Pulmonary:     Effort: Pulmonary effort is normal. No respiratory  distress.     Breath sounds: Normal breath sounds.  Chest:     Chest wall: No tenderness.  Musculoskeletal:        General: No swelling. Normal range of motion.     Cervical back: Normal range of motion and neck supple.  Skin:    General: Skin is warm and dry.     Coloration: Skin is not jaundiced.  Neurological:     General: No focal deficit present.     Mental Status: She is alert and oriented to person, place, and time.  Psychiatric:        Mood and Affect: Mood normal.        Behavior: Behavior normal.        Thought Content: Thought content normal.        Judgment: Judgment normal.     Comments: Notably anxious.     Adult ECG Report Not checked Recent Labs:   01/04/2021: TC 174, TG 208, HDL  68, LDL 72.  A1c not checked. Cr 1.33, K+ 4.2  Lab Results  Component Value Date   CHOL 183 01/10/2017   HDL 74 01/10/2017   LDLCALC 82 01/10/2017   TRIG 133 01/10/2017   CHOLHDL 2.5 01/10/2017   Lab Results  Component Value Date   CREATININE 0.81 01/10/2017   BUN 15 01/10/2017   NA 141 01/10/2017   K 4.9 01/10/2017   CL 107 (H) 01/10/2017   CO2 20 01/10/2017    CBC Latest Ref Rng & Units 03/05/2016 11/18/2015 11/12/2015  WBC 4.0 - 10.5 K/uL 7.3 7.5 6.8  Hemoglobin 12.0 - 15.0 g/dL 11.0(L) 10.4(L) 9.6(L)  Hematocrit 36.0 - 46.0 % 35.0(L) 32.7(L) 30.5(L)  Platelets 150 - 400 K/uL 280 348 241    Lab Results  Component Value Date   HGBA1C 5.9 (H) 11/09/2015   Lab Results  Component Value Date   TSH 2.67 11/25/2015    ==================================================  COVID-19 Education: The signs and symptoms of COVID-19 were discussed with the patient and how to seek care for testing (follow up with PCP or arrange E-visit).    I spent a total of 36 minutes with the patient spent in direct patient consultation.  Additional time spent with chart review  / charting (studies, outside notes, etc): 21 min Total Time: 57 min  Current medicines are reviewed at length  with the patient today.  (+/- concerns) none  This visit occurred during the SARS-CoV-2 public health emergency.  Safety protocols were in place, including screening questions prior to the visit, additional usage of staff PPE, and extensive cleaning of exam room while observing appropriate contact time as indicated for disinfecting solutions.  Notice: This dictation was prepared with Dragon dictation along with smart phrase technology. Any transcriptional errors that result from this process are unintentional and may not be corrected upon review.  Studies Ordered:   No orders of the defined types were placed in this encounter.   Patient Instructions / Medication Changes & Studies & Tests Ordered   Patient Instructions  Medication Instructions:   No changes  *If you need a refill on your cardiac medications before your next appointment, please call your pharmacy*   Lab Work: Not needed    Testing/Procedures:  Not needed  Follow-Up: At [facility], you and your health needs are our priority.  As part of our continuing mission to provide you with exceptional heart care, we have created designated Provider Care Teams.  These Care Teams include your primary Cardiologist (physician) and Advanced Practice Providers (APPs -  Physician Assistants and Nurse Practitioners) who all work together to provide you with the care you need, when you need it.     Your next appointment:   7 month(s)  The format for your next appointment:   In Person  Provider:   Glenetta Hew, MD    Other Instructions    Random blood pressures log  for several weeks  you can send information through  MyChart if you like.     Glenetta Hew, M.D., M.S. Interventional Cardiologist   Pager # [phone] Phone # [phone] [address] [place], [place] [address]   Thank you for choosing Heartcare at [facility]!!

## 2021-07-06 ENCOUNTER — Ambulatory Visit: Payer: Medicare Other

## 2021-07-20 ENCOUNTER — Encounter: Payer: Self-pay | Admitting: Cardiology

## 2021-07-20 DIAGNOSIS — I351 Nonrheumatic aortic (valve) insufficiency: Secondary | ICD-10-CM | POA: Insufficient documentation

## 2021-07-20 NOTE — Assessment & Plan Note (Signed)
Palpitations Dr. Nanetta Batty very well controlled on low-dose Toprol.  Mostly related to anxiety.

## 2021-07-20 NOTE — Assessment & Plan Note (Signed)
Asymptomatic.  No signs of subclavian steal.  She was due to Dopplers done this year.  If not done by the time I see her back, can reorder

## 2021-07-20 NOTE — Assessment & Plan Note (Addendum)
No 5-1/2 years from her STEMI.  Potential treatment related The next morning also 1 year later.  She had an LAD lesion evaluated with plans ischemic.  No further anginal symptoms with no need for further evaluation since.  No notable wall motion irregularities on echo.

## 2021-07-20 NOTE — Assessment & Plan Note (Signed)
Blood pressure seems high today.  I have asked that she keep a blood pressure log and we can reassess in follow-up.  She does have room to titrate up the amlodipine as well as Toprol.  We also potentially consider ARB. With her having AI, blood pressure control is somewhat important and therefore I would like her to monitor pressures and we can reassess in July.

## 2021-07-20 NOTE — Assessment & Plan Note (Signed)
ACS/anginal episode in 2017 thought to be related to coronary spasm.  Has been on Imdur and amlodipine.  Imdur discontinued in 2021.  Continued on amlodipine. On amlodipine preferred over ARB because of concern for spasm.  Seems relatively stable.

## 2021-07-20 NOTE — Assessment & Plan Note (Signed)
Most recent echo seem to overcall the extent of aortic insufficiency has been moderately severe.  I reviewed the images with Dr. Royann Shivers only postop with it is probably more consistent with mild to moderate aortic insufficiency.  Would need to follow-up an echo next year.  Can be ordered at follow-up visit.  Important treatment his blood pressure and rate control.

## 2021-07-20 NOTE — Assessment & Plan Note (Signed)
Difficult PCI of the ostial and mid RCA.  Very tortuous.  Now with ostial RCA stent, difficult to engage.  Patent stents in 2017.  No further angina.  She does have LAD lesion that is being monitored for symptoms.  Was FFR negative in 2017.  Has been unable to tolerate higher doses of Toprol due to fatigue and bradycardia, now on stable dose of 12.5 mg Toprol.  Also on moderate dose amlodipine.  Not currently on ARB/ACE inhibitor.  If pressures doing increase, would probably start low-dose ARB.  On stable dose of statin.  Labs borderline at goal.  On aspirin without Plavix.  Plavix stopped because of significant bruising.

## 2021-07-20 NOTE — Assessment & Plan Note (Addendum)
Labs are pretty borderline controlled in May of this year.  LDL was 72.  Would like it to be less than 55, but least less than 70 is reasonable.  She should be due for recheck soon.  She is now taking 40 mg atorvastatin.  Labs are usually followed by PCP in the summertime.  I will see her back after her labs checked by PCP are completed.

## 2021-07-25 DIAGNOSIS — L299 Pruritus, unspecified: Secondary | ICD-10-CM | POA: Diagnosis not present

## 2021-07-25 DIAGNOSIS — T781XXD Other adverse food reactions, not elsewhere classified, subsequent encounter: Secondary | ICD-10-CM | POA: Diagnosis not present

## 2021-07-25 DIAGNOSIS — J300 Vasomotor rhinitis: Secondary | ICD-10-CM | POA: Diagnosis not present

## 2021-07-25 DIAGNOSIS — R21 Rash and other nonspecific skin eruption: Secondary | ICD-10-CM | POA: Diagnosis not present

## 2021-08-09 ENCOUNTER — Ambulatory Visit: Payer: Medicare Other

## 2021-08-24 ENCOUNTER — Telehealth: Payer: Self-pay | Admitting: Cardiology

## 2021-08-24 NOTE — Telephone Encounter (Signed)
° °  Patient Name: Laura Herrera  DOB: [birth year] MRN: [id number]  Primary Cardiologist: [name], MD  Chart reviewed as part of pre-operative protocol coverage.   Simple (1-2 teeth) dental extractions and cleaning are considered low risk procedures per guidelines and generally do not require any specific cardiac clearance. It is also generally accepted that for simple extractions and dental cleanings, there is no need to interrupt blood thinner therapy.  SBE prophylaxis is not required for the patient from a cardiac standpoint.  I will route this recommendation to the requesting party via Epic fax function and remove from pre-op pool.  Please call with questions.  [place], [place] 08/24/2021, 3:05 PM

## 2021-08-24 NOTE — Telephone Encounter (Signed)
° °  Pre-operative Risk Assessment    Patient Name: Laura Herrera  DOB: 18-Apr-1951 MRN: [id number]     Request for Surgical Clearance    Procedure:   Dental Cleaning   Date of Surgery:  Clearance 08/24/21                                 Surgeon:  Lexine Baton  Surgeon's Group or Practice Name:  [facility] Phone number:  [phone] Fax number:  [phone]   Type of Clearance Requested:   - Medical  - Pharmacy:  Hold        Type of Anesthesia:  None    Additional requests/questions:  Does this patient need antibiotics?  Crist Infante   08/24/2021, 2:54 PM

## 2021-09-05 ENCOUNTER — Other Ambulatory Visit: Payer: Self-pay

## 2021-09-05 ENCOUNTER — Ambulatory Visit
Admission: RE | Admit: 2021-09-05 | Discharge: 2021-09-05 | Disposition: A | Discharge status: Home / Self Care | Payer: Medicare Other | Source: Ambulatory Visit | Attending: Family Medicine | Admitting: Family Medicine

## 2021-09-05 DIAGNOSIS — Z1231 Encounter for screening mammogram for malignant neoplasm of breast: Secondary | ICD-10-CM | POA: Diagnosis not present

## 2021-10-09 ENCOUNTER — Telehealth: Payer: Medicare Other | Admitting: Family

## 2021-10-09 ENCOUNTER — Emergency Department (HOSPITAL_BASED_OUTPATIENT_CLINIC_OR_DEPARTMENT_OTHER)
Admission: EM | Admit: 2021-10-09 | Discharge: 2021-10-09 | Disposition: A | Discharge status: Home / Self Care | Payer: Medicare Other | Attending: Emergency Medicine | Admitting: Emergency Medicine

## 2021-10-09 ENCOUNTER — Emergency Department (HOSPITAL_BASED_OUTPATIENT_CLINIC_OR_DEPARTMENT_OTHER): Payer: Medicare Other

## 2021-10-09 ENCOUNTER — Encounter (HOSPITAL_BASED_OUTPATIENT_CLINIC_OR_DEPARTMENT_OTHER): Payer: Self-pay | Admitting: Emergency Medicine

## 2021-10-09 ENCOUNTER — Emergency Department (HOSPITAL_BASED_OUTPATIENT_CLINIC_OR_DEPARTMENT_OTHER): Payer: Medicare Other | Admitting: Radiology

## 2021-10-09 ENCOUNTER — Other Ambulatory Visit: Payer: Self-pay

## 2021-10-09 DIAGNOSIS — I2510 Atherosclerotic heart disease of native coronary artery without angina pectoris: Secondary | ICD-10-CM | POA: Insufficient documentation

## 2021-10-09 DIAGNOSIS — N3000 Acute cystitis without hematuria: Secondary | ICD-10-CM | POA: Diagnosis not present

## 2021-10-09 DIAGNOSIS — R079 Chest pain, unspecified: Secondary | ICD-10-CM | POA: Diagnosis not present

## 2021-10-09 DIAGNOSIS — K5730 Diverticulosis of large intestine without perforation or abscess without bleeding: Secondary | ICD-10-CM | POA: Diagnosis not present

## 2021-10-09 DIAGNOSIS — R932 Abnormal findings on diagnostic imaging of liver and biliary tract: Secondary | ICD-10-CM | POA: Insufficient documentation

## 2021-10-09 DIAGNOSIS — I351 Nonrheumatic aortic (valve) insufficiency: Secondary | ICD-10-CM | POA: Diagnosis not present

## 2021-10-09 DIAGNOSIS — R5383 Other fatigue: Secondary | ICD-10-CM | POA: Insufficient documentation

## 2021-10-09 DIAGNOSIS — I10 Essential (primary) hypertension: Secondary | ICD-10-CM | POA: Diagnosis not present

## 2021-10-09 DIAGNOSIS — R072 Precordial pain: Secondary | ICD-10-CM | POA: Diagnosis not present

## 2021-10-09 DIAGNOSIS — Z955 Presence of coronary angioplasty implant and graft: Secondary | ICD-10-CM | POA: Diagnosis not present

## 2021-10-09 DIAGNOSIS — I252 Old myocardial infarction: Secondary | ICD-10-CM | POA: Diagnosis not present

## 2021-10-09 DIAGNOSIS — R0789 Other chest pain: Secondary | ICD-10-CM | POA: Diagnosis not present

## 2021-10-09 DIAGNOSIS — K8020 Calculus of gallbladder without cholecystitis without obstruction: Secondary | ICD-10-CM | POA: Diagnosis not present

## 2021-10-09 DIAGNOSIS — Z7982 Long term (current) use of aspirin: Secondary | ICD-10-CM | POA: Insufficient documentation

## 2021-10-09 DIAGNOSIS — Z79899 Other long term (current) drug therapy: Secondary | ICD-10-CM | POA: Insufficient documentation

## 2021-10-09 DIAGNOSIS — R002 Palpitations: Secondary | ICD-10-CM | POA: Diagnosis not present

## 2021-10-09 DIAGNOSIS — K449 Diaphragmatic hernia without obstruction or gangrene: Secondary | ICD-10-CM | POA: Diagnosis not present

## 2021-10-09 LAB — COMPREHENSIVE METABOLIC PANEL
ALT: 19 U/L (ref 0–44)
AST: 24 U/L (ref 15–41)
Albumin: 4.6 g/dL (ref 3.5–5.0)
Alkaline Phosphatase: 67 U/L (ref 38–126)
Anion gap: 11 (ref 5–15)
BUN: 18 mg/dL (ref 8–23)
CO2: 24 mmol/L (ref 22–32)
Calcium: 10.4 mg/dL — ABNORMAL HIGH (ref 8.9–10.3)
Chloride: 104 mmol/L (ref 98–111)
Creatinine, Ser: 1.26 mg/dL — ABNORMAL HIGH (ref 0.44–1.00)
GFR, Estimated: 46 mL/min — ABNORMAL LOW (ref >60)
Glucose, Bld: 91 mg/dL (ref 70–99)
Potassium: 3.8 mmol/L (ref 3.5–5.1)
Sodium: 139 mmol/L (ref 135–145)
Total Bilirubin: 0.6 mg/dL (ref 0.3–1.2)
Total Protein: 7.6 g/dL (ref 6.5–8.1)

## 2021-10-09 LAB — URINALYSIS, ROUTINE W REFLEX MICROSCOPIC
Bilirubin Urine: NEGATIVE
Glucose, UA: NEGATIVE mg/dL
Ketones, ur: NEGATIVE mg/dL
Nitrite: POSITIVE — AB
Protein, ur: 30 mg/dL — AB
Specific Gravity, Urine: 1.02 (ref 1.005–1.030)
WBC, UA: >50 WBC/hpf — ABNORMAL HIGH (ref 0–5)
pH: 6 (ref 5.0–8.0)

## 2021-10-09 LAB — TROPONIN I (HIGH SENSITIVITY)
Troponin I (High Sensitivity): 4 ng/L (ref <18)
Troponin I (High Sensitivity): 5 ng/L (ref <18)

## 2021-10-09 LAB — CBC WITH DIFFERENTIAL/PLATELET
Abs Immature Granulocytes: 0.02 10*3/uL (ref 0.00–0.07)
Basophils Absolute: 0.1 10*3/uL (ref 0.0–0.1)
Basophils Relative: 1 %
Eosinophils Absolute: 0.5 10*3/uL (ref 0.0–0.5)
Eosinophils Relative: 6 %
HCT: 39.5 % (ref 36.0–46.0)
Hemoglobin: 13.2 g/dL (ref 12.0–15.0)
Immature Granulocytes: 0 %
Lymphocytes Relative: 16 %
Lymphs Abs: 1.3 10*3/uL (ref 0.7–4.0)
MCH: 32 pg (ref 26.0–34.0)
MCHC: 33.4 g/dL (ref 30.0–36.0)
MCV: 95.6 fL (ref 80.0–100.0)
Monocytes Absolute: 0.6 10*3/uL (ref 0.1–1.0)
Monocytes Relative: 7 %
Neutro Abs: 5.5 10*3/uL (ref 1.7–7.7)
Neutrophils Relative %: 70 %
Platelets: 219 10*3/uL (ref 150–400)
RBC: 4.13 MIL/uL (ref 3.87–5.11)
RDW: 12.3 % (ref 11.5–15.5)
WBC: 8 10*3/uL (ref 4.0–10.5)
nRBC: 0 % (ref 0.0–0.2)

## 2021-10-09 LAB — LIPASE, BLOOD: Lipase: 54 U/L — ABNORMAL HIGH (ref 11–51)

## 2021-10-09 LAB — BRAIN NATRIURETIC PEPTIDE: B Natriuretic Peptide: 119.4 pg/mL — ABNORMAL HIGH (ref 0.0–100.0)

## 2021-10-09 LAB — TSH: TSH: 1.665 u[IU]/mL (ref 0.350–4.500)

## 2021-10-09 MED ORDER — CEPHALEXIN 500 MG PO CAPS: 500.0000 mg | ORAL_CAPSULE | Freq: Two times a day (BID) | ORAL | 0 refills | Status: AC

## 2021-10-09 MED ORDER — IOHEXOL 300 MG/ML  SOLN
100.0000 mL | Freq: Once | INTRAMUSCULAR | Status: AC | PRN
  Administered 2021-10-09: 100 mL via INTRAVENOUS

## 2021-10-09 NOTE — ED Notes (Signed)
2nd troponin sent to lab at this time.  

## 2021-10-09 NOTE — ED Triage Notes (Signed)
Pt reports minor chest tightness and HTN yesterday. Endorses palpitations every few days. Pt has hx of stents about 6 years ago. Denies any SOB or dizziness.  ?

## 2021-10-09 NOTE — Progress Notes (Signed)
?  Virtual Visit Consent  ? ?PT could not connect to video. Called and discussed patient, and states she is having elevated BP, heart palpitation, and mild chest pain on and off. Discussed with age and risk factors, recommend to be seen at Urgent Care today.  ? ?Jannifer Rodney, FNP ? ?

## 2021-10-09 NOTE — ED Notes (Signed)
Ultrasound completed at this time

## 2021-10-09 NOTE — Discharge Instructions (Signed)
Your history, exam, work-up today revealed convincing evidence of urinary tract infection so please take the antibiotics for that.  Your heart enzymes were negative and after speaking with cardiology, we feel you are safe for discharge home from a cardiac standpoint.  The work-up ultimately did show some abnormalities with your stomach, esophagus, and gallbladder however given your lack of symptoms there, we agreed to hold on more extensive work-up, transfer, and consultation tonight however we do feel you need to call your gastroenterology team with [facility] to discuss further management as well as your PCP.  Please rest and stay hydrated.  If any symptoms change or worsen acutely, please return to the nearest emergency department. ?

## 2021-10-09 NOTE — ED Provider Notes (Signed)
[facility] EMERGENCY DEPT Provider Note   CSN: [id number] Arrival date & time: 10/09/21  1458     History  Chief Complaint  Patient presents with   Chest Pain   Hypertension    Laura Herrera is a 71 y.o. female.  The history is provided by the patient and medical records. No language interpreter was used.  Chest Pain Pain location:  Substernal area Pain quality: aching and dull   Pain radiates to:  Does not radiate Pain severity:  Mild Onset quality:  Gradual Duration:  2 days Timing:  Intermittent Progression:  Waxing and waning Chronicity:  New Relieved by:  Nothing Worsened by:  Nothing Ineffective treatments:  None tried Associated symptoms: fatigue and palpitations (chronic by report)   Associated symptoms: no abdominal pain, no altered mental status, no anxiety, no back pain, no claudication, no cough, no diaphoresis, no dizziness, no fever, no headache, no lower extremity edema, no nausea, no near-syncope, no numbness, no shortness of breath, no vomiting and no weakness   Risk factors: coronary artery disease and hypertension   Risk factors: no prior DVT/PE   Hypertension This is a chronic problem. Associated symptoms include chest pain. Pertinent negatives include no abdominal pain, no headaches and no shortness of breath. She has tried nothing for the symptoms. The treatment provided no relief.      Home Medications Prior to Admission medications   Medication Sig Start Date End Date Taking? Authorizing Provider  hydrOXYzine (VISTARIL) 25 MG capsule Take 25 mg by mouth at bedtime.   Yes [provider]  acetaminophen (TYLENOL) 500 MG tablet Take 500 mg by mouth every 6 (six) hours as needed for headache.    [provider]  ALPRAZolam Prudy Feeler) 0.25 MG tablet TAKE 1 TABLET TWICE A DAY AS NEEDED FOR ANXIETY 11/12/15   [provider]  amLODipine (NORVASC) 5 MG tablet TAKE 1 TABLET(5 MG) BY MOUTH DAILY 05/27/21   Jodelle Gross, NP  aspirin EC 81 MG tablet Take 1 tablet (81 mg total) by mouth daily. 10/30/19   Marykay Lex, MD  atorvastatin (LIPITOR) 40 MG tablet Take 1 tablet (40 mg total) by mouth daily. 05/27/21   Jodelle Gross, NP  cephALEXin (KEFLEX) 500 MG capsule Take 500 mg by mouth 4 (four) times daily. 10/27/19   [provider]  cholecalciferol (VITAMIN D) 1000 UNITS tablet Take 1,000 Units by mouth daily.    [provider]  metoprolol succinate (TOPROL-XL) 25 MG 24 hr tablet TAKE 1/2 TABLET(12.5 MG) BY MOUTH DAILY 05/27/21   Jodelle Gross, NP  nitroGLYCERIN (NITROSTAT) 0.4 MG SL tablet Place 1 tablet (0.4 mg total) under the tongue every 5 (five) minutes x 3 doses as needed for chest pain. 01/10/17   Iran Ouch, Lennart Pall, PA-C  pantoprazole (PROTONIX) 40 MG tablet TAKE 1 TABLET BY MOUTH DAILY AT 12 NOON. 12/09/19   Marykay Lex, MD  triamcinolone cream (KENALOG) 0.1 % as needed.    [provider]      Allergies    Cholinesterase inhibitors, Other, Adhesive [tape], Shellfish-derived products, Epinephrine, and Vancomycin    Review of Systems   Review of Systems  Constitutional:  Positive for fatigue. Negative for chills, diaphoresis and fever.  HENT:  Negative for congestion.   Eyes:  Negative for visual disturbance.  Respiratory:  Negative for cough, chest tightness, shortness of breath and wheezing.   Cardiovascular:  Positive for chest pain and palpitations (chronic by report). Negative  for claudication, leg swelling and near-syncope.  Gastrointestinal:  Negative for abdominal pain, constipation, diarrhea, nausea and vomiting.  Genitourinary:  Negative for dysuria and frequency.  Musculoskeletal:  Negative for back pain, neck pain and neck stiffness.  Skin:  Negative for rash and wound.  Neurological:  Negative for dizziness, syncope, weakness, light-headedness, numbness and headaches.  Psychiatric/Behavioral:  Negative for agitation and confusion.    All other systems reviewed and are negative.  Physical Exam Updated Vital Signs BP (!) 160/69    Pulse 66    Temp 98.1 F (36.7 C) (Oral)    Resp (!) 22    Ht 5\' 8"  (1.727 m)    Wt 71.2 kg    SpO2 100%    BMI 23.87 kg/m  Physical Exam Vitals and nursing note reviewed.  Constitutional:      General: She is not in acute distress.    Appearance: She is well-developed. She is not ill-appearing, toxic-appearing or diaphoretic.  HENT:     Head: Normocephalic and atraumatic.  Eyes:     Extraocular Movements: Extraocular movements intact.     Conjunctiva/sclera: Conjunctivae normal.     Pupils: Pupils are equal, round, and reactive to light.  Cardiovascular:     Rate and Rhythm: Normal rate and regular rhythm.     Heart sounds: Murmur heard.  Pulmonary:     Effort: Pulmonary effort is normal. No tachypnea or respiratory distress.     Breath sounds: Normal breath sounds. No wheezing, rhonchi or rales.  Chest:     Chest wall: No tenderness.  Abdominal:     Palpations: Abdomen is soft.     Tenderness: There is no abdominal tenderness.  Musculoskeletal:        General: No swelling.     Cervical back: Neck supple.     Right lower leg: No tenderness. No edema.     Left lower leg: No tenderness. No edema.  Skin:    General: Skin is warm and dry.     Capillary Refill: Capillary refill takes less than 2 seconds.     Coloration: Skin is not cyanotic or pale.     Findings: No erythema.  Neurological:     General: No focal deficit present.     Mental Status: She is alert.  Psychiatric:        Mood and Affect: Mood normal. Mood is not anxious.    ED Results / Procedures / Treatments   Labs (all labs ordered are listed, but only abnormal results are displayed) Labs Reviewed  COMPREHENSIVE METABOLIC PANEL - Abnormal; Notable for the following components:      Result Value   Creatinine, Ser 1.26 (*)    Calcium 10.4 (*)    GFR, Estimated 46 (*)    All other components within normal  limits  LIPASE, BLOOD - Abnormal; Notable for the following components:   Lipase 54 (*)    All other components within normal limits  BRAIN NATRIURETIC PEPTIDE - Abnormal; Notable for the following components:   B Natriuretic Peptide 119.4 (*)    All other components within normal limits  URINALYSIS, ROUTINE W REFLEX MICROSCOPIC - Abnormal; Notable for the following components:   APPearance HAZY (*)    Hgb urine dipstick TRACE (*)    Protein, ur 30 (*)    Nitrite POSITIVE (*)    Leukocytes,Ua LARGE (*)    WBC, UA >50 (*)    Bacteria, UA RARE (*)    All other components within  normal limits  CBC WITH DIFFERENTIAL/PLATELET  TSH  TROPONIN I (HIGH SENSITIVITY)  TROPONIN I (HIGH SENSITIVITY)    EKG EKG Interpretation  Date/Time:  Sunday October 09 2021 15:06:34 EST Ventricular Rate:  65 PR Interval:  160 QRS Duration: 85 QT Interval:  454 QTC Calculation: 473 R Axis:   -15 Text Interpretation: Sinus rhythm Left ventricular hypertrophy Inferior infarct, age indeterminate Tall R wave in V2, consider RVH or PMI when compared to prior, similar appearance with deeper t wave inversion in lead 3. no STEMI Confirmed by Theda Belfast ([id number]) on 10/09/2021 3:14:14 PM  Radiology DG Chest 2 View  Result Date: 10/09/2021 CLINICAL DATA:  Chest pain. EXAM: CHEST - 2 VIEW COMPARISON:  Chest x-ray 03/05/2016 FINDINGS: There is a large hiatal hernia which has slightly increased in size. Lungs are clear. There is no pleural effusion or pneumothorax. Cardiomediastinal silhouette is within normal limits. No acute fractures are seen. IMPRESSION: 1. Large hiatal hernia has increased in size. 2. The lungs are clear. Electronically Signed   By: Darliss Cheney M.D.   On: 10/09/2021 15:55   CT CHEST ABDOMEN PELVIS W CONTRAST  Result Date: 10/09/2021 CLINICAL DATA:  Chest pain. Chest pressure. History of esophageal stricture and dilatation. EXAM: CT CHEST, ABDOMEN, AND PELVIS WITH CONTRAST TECHNIQUE: Multidetector  CT imaging of the chest, abdomen and pelvis was performed following the standard protocol during bolus administration of intravenous contrast. RADIATION DOSE REDUCTION: This exam was performed according to the departmental dose-optimization program which includes automated exposure control, adjustment of the mA and/or kV according to patient size and/or use of iterative reconstruction technique. CONTRAST:  OMNIPAQUE IOHEXOL 300 MG/ML  SOLN COMPARISON:  Esophagram 03/29/2020. FINDINGS: CT CHEST FINDINGS Cardiovascular: No significant vascular findings. Normal heart size. No pericardial effusion. There are atherosclerotic calcifications of the aorta and coronary arteries. Mediastinum/Nodes: Visualized thyroid gland is within normal limits. There are no enlarged mediastinal or hilar lymph nodes. The esophagus is nondilated there is some questionable wall thickening of the distal esophagus. There is a large hiatal hernia containing the proximal stomach. Lungs/Pleura: There is some linear scarring in the right lower lobe. Lungs are otherwise clear. No pleural effusion or pneumothorax. There is a 1 mm nodule in the right upper lobe image 4/38. Musculoskeletal: No chest wall mass or suspicious bone lesions identified. CT ABDOMEN PELVIS FINDINGS Hepatobiliary: There is abnormal appearance of the gallbladder. There is circumferential stricturing versus mass in the mid gallbladder. Gallstones are present. No biliary ductal dilatation or surrounding inflammation. Liver is within normal limits. Pancreas: Unremarkable. No pancreatic ductal dilatation or surrounding inflammatory changes. Spleen: Normal in size without focal abnormality. Adrenals/Urinary Tract: There are calculi in the left kidney measuring up to 8 mm. There is no hydronephrosis. There is some cortical scarring in the left kidney. Rounded hypodensity in the left kidney is too small to characterize, likely a cyst. No perinephric fat stranding. Adrenal glands  are within normal limits. There is mild diffuse bladder wall thickening with some surrounding inflammation anteriorly. Stomach/Bowel: No acute inflammatory stranding, bowel obstruction or free air. The appendix is not visualized. There is sigmoid colon diverticulosis without evidence for acute diverticulitis. Large hiatal hernia present containing the proximal stomach. Stomach is nondilated. There is questionable gastric antral wall thickening versus normal under distension. Vascular/Lymphatic: Aortic atherosclerosis. No enlarged abdominal or pelvic lymph nodes. Reproductive: There is a 5.2 by 3.5 by 4.7 cm left adnexal cystic structure. Uterus and right adnexa are unremarkable. Other: No abdominal wall hernia  or abnormality. No abdominopelvic ascites. Musculoskeletal: No acute or significant osseous findings. IMPRESSION: 1. Large hiatal hernia containing proximal stomach. No evidence for bowel obstruction. 2. Wall thickening of the distal esophagus may represent esophagitis or neoplasm. 3. Questionable wall thickening of the gastric antrum. Correlate for gastritis. Consider follow-up dedicated imaging to exclude underlying pathology. 4. Abnormal appearance of the gallbladder with central gallbladder wall thickening versus mass. Cholelithiasis. Recommend further evaluation with ultrasound. 5. 1 mm right solid pulmonary nodule within the upper lobe. If patient is low risk for malignancy, no routine follow-up imaging is recommended; if patient is high risk for malignancy, a non-contrast Chest CT at 12 months is optional. If performed and the nodule is stable at 12 months, no further follow-up is recommended. These guidelines do not apply to immunocompromised patients and patients with cancer. Follow up in patients with significant comorbidities as clinically warranted. For lung cancer screening, adhere to Lung-RADS guidelines. Reference: Radiology. 2017; 284(1):228-43. 6. 5.2 cm left adnexal simple-appearing cyst.  Recommend follow-up pelvic US in 6-12 months. Reference: JACR 2020 Feb;17(2):248-254 7. Nonobstructing left renal calculi. Electronically Signed   By: Darliss Cheney M.D.   On: 10/09/2021 18:31   US Abdomen Limited RUQ (LIVER/GB)  Result Date: 10/09/2021 CLINICAL DATA:  Abnormal gallbladder on CT. EXAM: ULTRASOUND ABDOMEN LIMITED RIGHT UPPER QUADRANT COMPARISON:  None. FINDINGS: Gallbladder: Multiple gallstones are present. Gallbladder sludge is present. The neck and lower body of the gallbladder are difficult to visualize secondary to overlying bowel gas. Gallbladder wall is thickened measuring up to 5 mm. There is a nonshadowing 1.2 cm echogenic focus within the gallbladder which is indeterminate for polyp. No sonographic Murphy sign noted by sonographer. Common bile duct: Diameter: 2 mm Liver: No focal lesion identified. Increase in parenchymal echogenicity. Portal vein is patent on color Doppler imaging with normal direction of blood flow towards the liver. Other: None. IMPRESSION: 1. Cholelithiasis, gallbladder sludge and gallbladder wall thickening. Cholecystitis not excluded. 2. Questionable 1.2 cm gallbladder polyp or other neoplasm. Area in question involving the body of the gallbladder is not well evaluated secondary to overlying bowel gas. Given findings on CT, recommend further evaluation with MRI. 3. Echogenic liver likely related to fatty infiltration. Electronically Signed   By: Darliss Cheney M.D.   On: 10/09/2021 19:37    Procedures Procedures    Medications Ordered in ED Medications  iohexol (OMNIPAQUE) 300 MG/ML solution 100 mL (100 mLs Intravenous Contrast Given 10/09/21 1800)    ED Course/ Medical Decision Making/ A&P                           Medical Decision Making Amount and/or Complexity of Data Reviewed Labs: ordered. Radiology: ordered.  Risk Prescription drug management.    HARVEEN FLESCH is a 71 y.o. female with a past medical history significant for CAD with  prior STEMI and PCI, chronic palpitations, hypertension, hyperlipidemia, and aortic insufficiency who presents with 3 days of fatigue, decreased appetite, and 2 days of chest discomfort.  Patient reports that she has not had much energy for the last few days and blood pressures have been slightly higher at home.  They have been into the 160s.  She has not changed any of her medications.  She reports she has not had much of an appetite and then yesterday started having chest pressure in her central chest.  It was more of a mild ache but it comes and goes.  It is not  pleuritic and not exertional.  She denies any shortness of breath, near-syncope, or diaphoresis.  She denies new nausea, vomiting, constipation, diarrhea, or urinary changes.  Denies any leg pain or leg swelling.  Denies any stroke symptoms.  It does not radiate to her shoulder, jaw, neck, or back.  Due to the intermittent discomfort, patient wanted to make sure this is not her heart again.  She reports it does not feel like her prior MI.  On exam, lungs clear and chest nontender.  I could not provoke her discomfort.  She does have a murmur.  Abdomen nontender.  Good pulses in extremities.  Legs are nontender and nonedematous.  Vital signs show some hypertension with blood pressure in the 140s to 150s systolic during my initial evaluation.  Patient resting comfortably otherwise.  She is symptom-free on exam.  EKG appeared similar to prior but did show some deeper T waves in lead III than before.  Had a discussion with patient and we will start work-up for the 3 days of fatigue as well as the 2 days of chest discomfort.  We will get troponins, chest x-ray, and labs.  Due to her history of aortic valve disease and her murmur, blood pressure fluctuations could be contributing to some of her symptoms.  Anticipate discussion with cardiology after work-up is completed to determine disposition.  Patient reported that pain came back in a wave and moved  to the left side of her chest.  It was only lasting several seconds but was a heavy pressure.  EKG was performed and appears similar.  5:40 PM X-ray returned showing enlargement of the hiatal hernia.  I called radiology and they feel that is appropriate to get CT imaging to make sure there is no acute problem with it.  They recommended CT chest/Abdo/pelvis with both IV and oral contrast to evaluate.  I informed patient of this and she agrees.  She does say she had a esophageal dilation in the past and wants to make sure there is no complication from that.  If that is reassuring, anticipate discussion with cardiology.  8:59 PM Work-up returned and involved initially with a CT scan showing hiatal hernia with no evidence of obstruction but did show evidence of possible esophagitis, possible gastritis, and possible cholecystitis.  Ultrasound was recommended.  Ultrasound was obtained showing sludge, wall thickening, and gallstones and cannot rule out acute cholecystitis.  I again reassessed the patient and she is having no pain in her right upper quadrant or epigastric area.  She does not want to discuss transfer for MRI and does not think she needs a general surgery or GI consult at this time.  She would rather follow-up with her [facility] team to further determine what is going on with her esophagus, stomach, and gallbladder.  She primarily was here to make sure she is not having MI and so I called cardiology after the second troponin was negative and he reviewed everything and feels she is safe for discharge home.  He agrees to have her follow-up with her cardiologist this week.  We did find urinary tract infection as well so we will give prescription for this and patient agrees to take the antibiotics.  Patient agrees with plan of care and was discharged in good condition with understanding of extremely strict return precautions, the follow-up instructions, and treatment plan.         Final Clinical  Impression(s) / ED Diagnoses Final diagnoses:  Chest pain  Abnormal gallbladder ultrasound  Acute cystitis without hematuria    Rx / DC Orders ED Discharge Orders          Ordered    cephALEXin (KEFLEX) 500 MG capsule  2 times daily        10/09/21 2106           Clinical Impression: 1. Abnormal gallbladder ultrasound   2. Chest pain   3. Acute cystitis without hematuria     Disposition: Discharge  Condition: Good  I have discussed the results, Dx and Tx plan with the pt(& family if present). He/she/they expressed understanding and agree(s) with the plan. Discharge instructions discussed at great length. Strict return precautions discussed and pt &/or family have verbalized understanding of the instructions. No further questions at time of discharge.    New Prescriptions   CEPHALEXIN (KEFLEX) 500 MG CAPSULE    Take 1 capsule (500 mg total) by mouth 2 (two) times daily for 7 days.    Follow Up: Gastroenterology, Deboraha Sprang [address] [place] [address] [phone]     your cardiologist     Darrow Bussing, MD [address] [place] [address] [phone]     [facility] [address] [place] [address] [phone]       Elke Holtry, Canary Brim, MD 10/09/21 2107

## 2021-10-09 NOTE — ED Notes (Signed)
Dr Tegeler in room w/pt now. 

## 2021-10-09 NOTE — ED Notes (Signed)
Dc instructions and scripts reviewed with pt no questions or concerns at this time. Will follow up with cardiology, GI and PCP.  ?

## 2021-10-13 NOTE — Progress Notes (Signed)
Cardiology Clinic Note   Patient Name: Laura Herrera Date of Encounter: 10/14/2021  Primary Care Provider:  Lujean Amel, MD Primary Cardiologist:  Laura Hew, MD  Patient Profile    71 year old female with history of coronary artery disease, inferior STEMI 01/2015 with PCI to the RCA, hypertension, hyperlipidemia, stenosis of the right subclavian artery asymptomatic, aortic valve insufficiency (moderately severe) with review by second cardiologists noted to be more consistent with mild to moderate aortic severe insufficiency.  And palpitations.  Last seen by Laura Herrera on 06/28/2021.  Past Medical History    Past Medical History:  Diagnosis Date   Aortic valve insufficiency 06/2021   Echo read as normal EF, normal wall motions.  Severe MAC.  Moderate to severe AI.=> On review with imaging cardiologist, this appears to be an over estimation.  More consistent with mild to moderate AI.   CAD S/P percutaneous coronary angioplasty 01/2015   a. 01/2015 Inf STEMI/PCI: ostRCA 80% (Promus DES 2.75x12 - 3.1 mm), 100% & 70% p-mRCA covered with 1 DES (Promus DES 2.5x24  - 2.8 mm ), dRCA 90%(PTCA) -10% ;  b. 01/2015 Relook: LM nl, mLAD 20%, LCX nl- L->R collats, Thrombus in p-mRCA stent->Aggrastat;  c. 01/2015 Echo: EF 60-65%, no RWMA, mod AI, PAP 36mHg. Laura. 11/2015: CATH: patent stents, FFR neg LM-LAD   Chest pain with high risk for cardiac etiology 11/19/2015   Cardiac catheterization showed stable coronaries with patent RCA stents. FFR of LAD negative. --> LV gram EF suggested Takotsubo versus LAD spasm.   Complication of anesthesia    pseudo cholinesterace deficiency   Depression    Dyslipidemia    Generalized anxiety disorder    Currently being treated only with PRN Xanax for panic attacks.   Headache    "used to have them daily; I no longer do" (11/09/2015)   Hypertension    Insomnia    Kidney stones    "years ago; passed it"   MRSA infection    h/o MRSA infection in the right foot    NSTEMI (non-ST elevated myocardial infarction) [place] 11/09/2015   No obvious culprit lesion found.  Thought to be potentially Takotsubo.   PONV (postoperative nausea and vomiting)    Pseudocholinesterase deficiency    Psoriasis    hand and foot (11/09/2015)   ST elevation myocardial infarction (STEMI) of inferior wall ([facility] [place] 01/2015   Severe RCA disease.  Ostial to proximal.  Single stent with PTCA of distal RCA and the PDA..Marland Kitchen  Past Surgical History:  Procedure Laterality Date   ANGIOPLASTY  01/12/2015   Procedure: Angioplasty;  Surgeon: HBelva Crome MD;  [facility];  Service: Cardiovascular;; Unable to wire RCA -- treated plaque protrusion in pRCA stent with Aggrastat infusion.   APPENDECTOMY     BREAST BIOPSY     CARDIAC CATHETERIZATION N/A 01/11/2015   Procedure: Left Heart Cath and Coronary Angiography;  Surgeon: DLeonie Man MD;  [facility];  Service: Cardiovascular;  ost RCA 80% (calcified), pRCA 100% (followed by 70% tandem lesion), dRCA 90%.   Mild to moderate disease in LAD and circumflex   CARDIAC CATHETERIZATION  01/11/2015   Procedure: Coronary Stent Intervention;  Surgeon: DLeonie Man MD;  [facility];  Service: Cardiovascular;; ost RCA 80%- cutting balloon PTCA-> DES PCI (2.75 x 12 Promus DES - 3.1 mm); pRCA (covering tandem 100% -7-%) DES PCI (Promus DES 2.5 x 24 --> 2.8 mm)   CARDIAC CATHETERIZATION  01/11/2015   Procedure: Coronary Balloon Angioplasty;  Surgeon: Laura Man, MD;  Location: [facility];  Service: Cardiovascular;;PTCA of dRCA 90% --> 10%.   CARDIAC CATHETERIZATION N/A 01/12/2015   Procedure: Left Heart Cath and Coronary Angiography;  Surgeon: Laura Crome, MD;  Location: [facility];  Service: Cardiovascular;; Relook Cath for recurrent CP post STEMI PCI --> difficult to engage ostial RCA stent (unable to wire) - plaque protrusion in pRCA stent --> Rx with Aggrastat infusion.    CARDIAC CATHETERIZATION N/A 11/10/2015   Procedure: Left Heart Cath and Coronary Angiography;  Surgeon: Laura Sine, MD;  Location: [facility];  Service: Cardiovascular; patent RCA stents. Ostial LM 30%, ostial LAD 40% - FFR negative. Moderate-severe LV dysfunction with hypo-kinesis involving the anterior wall to the apex. EF suggested 30%. (? Takotsubo)   CARDIAC CATHETERIZATION N/A 11/10/2015   Procedure: Intravascular Pressure Wire/FFR Study;  Surgeon: Laura Sine, MD;  Location: [facility];  Service: Cardiovascular;  FFR of LAD negative.   DILATION AND CURETTAGE OF UTERUS  1979   S/P miscarriage   INCISION AND DRAINAGE FOOT Right    "spider bite"   NOSE SURGERY  X 2   "to remove genetic bone spur"   TRANSTHORACIC ECHOCARDIOGRAM  11/11/2015   Normal LV size and function. Vigorous function with EF 6570%. GR 1 DD. Mild-moderate MR.   TRANSTHORACIC ECHOCARDIOGRAM  06/13/2021   EF 60 to 65%.  No R WMA.  Unable to assess EF.  Normal RV size function.  Normal RV pressures.  Severe MAC with no MR or MS. "  Moderate-severe" AI - overread as Mild-Moderate (Dr. Sallyanne Herrera).    Allergies  Allergies  Allergen Reactions   Cholinesterase Inhibitors Other (See Comments)    Enzyme deficiency which causes muscle paralyses after receiving anesthesia.   Other Other (See Comments) and Rash    Sensitive to the electrodes Tears skin off, and "paper" tape also   Adhesive [Tape] Other (See Comments)    Tears skin off, and "paper" tape also   Shellfish-Derived Products Swelling    Crabs, lobster, crayfish; tissues in joints swelled   Epinephrine Palpitations    Palpitations and involuntary body jerking   Vancomycin Hives and Rash    severe     History of Present Illness    Laura Herrera is a 71 year old female we are following for ongoing assessment and management of coronary artery disease status post PCI to the RCA in 2016, hyperlipidemia, hypertension, palpitations, and mild to  moderate AI.  She was last seen by Laura Herrera on 06/28/2021 and was feeling much better once blood pressure was controlled on refills for her medications on a previous visit.  She was continued on current medication regimen.  She admitted to being under a significant amount of personal stress.  Unfortunately, she was seen in the emergency room on 10/09/2021 with complaints of substernal chest pain described as aching and dull and nonradiating, occurring intermittently for the 2 days prior to her evaluation in ED.  She was ruled out for ACS, was found to be positive for UTI with elevation and nitrates.  EKG was unremarkable and unchanged from prior EKG, with exception of some deeper T waves in lead III than before.  X-ray revealed enlargement of hiatal hernia.  With a follow-up CT scan revealing evidence of esophagitis and possible gastritis and possible cholecystitis.  She was found to have gallstones on ultrasound.  She was referred to  surgery, and follow-up with [facility].  She was treated with antibiotics for her urinary tract infection.  She continues to have concerns about her aortic valve regurgitation.  We did review her echocardiogram today and follow-up reading with Dr. Sallyanne Herrera.  She is reassured by the results.  She is going to follow-up with GI concerning issues found during recent work-up.  She has also been recently diagnosed with Alpha Gal syndrome.  Home Medications    Current Outpatient Medications  Medication Sig Dispense Refill   acetaminophen (TYLENOL) 500 MG tablet Take 500 mg by mouth every 6 (six) hours as needed for headache.     ALPRAZolam (XANAX) 0.25 MG tablet TAKE 1 TABLET TWICE A DAY AS NEEDED FOR ANXIETY  0   amLODipine (NORVASC) 5 MG tablet TAKE 1 TABLET(5 MG) BY MOUTH DAILY 90 tablet 3   aspirin EC 81 MG tablet Take 1 tablet (81 mg total) by mouth daily. 90 tablet 3   atorvastatin (LIPITOR) 40 MG tablet Take 1 tablet (40 mg total) by mouth daily. 90 tablet 3   cephALEXin  (KEFLEX) 500 MG capsule Take 1 capsule (500 mg total) by mouth 2 (two) times daily for 7 days. 14 capsule 0   cholecalciferol (VITAMIN Laura) 1000 UNITS tablet Take 1,000 Units by mouth daily.     EPINEPHrine 0.3 mg/0.3 mL IJ SOAJ injection USE AS DIRECTED AS NEEDED     furosemide (LASIX) 20 MG tablet Take 1 tablet by mouth as needed.     hydrOXYzine (VISTARIL) 25 MG capsule Take 25 mg by mouth at bedtime.     metoprolol succinate (TOPROL-XL) 25 MG 24 hr tablet TAKE 1/2 TABLET(12.5 MG) BY MOUTH DAILY 45 tablet 2   nitroGLYCERIN (NITROSTAT) 0.4 MG SL tablet Place 1 tablet (0.4 mg total) under the tongue every 5 (five) minutes x 3 doses as needed for chest pain. 25 tablet 3   pantoprazole (PROTONIX) 40 MG tablet TAKE 1 TABLET BY MOUTH DAILY AT 12 NOON. 90 tablet 3   triamcinolone cream (KENALOG) 0.1 % as needed.     No current facility-administered medications for this visit.     Family History    Family History  Problem Relation Age of Onset   Peripheral vascular disease Mother    Hyperlipidemia Mother    Alzheimer's disease Father    Mental illness Sister    Heart Problems Sister    Schizophrenia Sister    Hypotension Sister    Hyperlipidemia Brother    Heart disease Maternal Grandmother    Breast cancer Maternal Aunt 60   Breast cancer Maternal Aunt    Heart attack Neg Hx    She indicated that her mother is deceased. She indicated that her father is deceased. She indicated that her sister is alive. She indicated that her brother is alive. She indicated that her maternal grandmother is deceased. She indicated that her maternal grandfather is deceased. She indicated that her paternal grandmother is deceased. She indicated that her paternal grandfather is deceased. She indicated that the status of her neg hx is unknown.  Social History    Social History   Socioeconomic History   Marital status: Widowed    Spouse name: Not on file   Number of children: Not on file   Years of  education: Not on file   Highest education level: Not on file  Occupational History   Occupation: unemployed  Tobacco Use   Smoking status: Former    Packs/day: 0.10  Years: 2.00    Pack years: 0.20    Types: Cigarettes   Smokeless tobacco: Never   Tobacco comments:    "quit smoking in the 1980s"  Substance and Sexual Activity   Alcohol use: Yes    Alcohol/week: 2.0 standard drinks    Types: 2 Glasses of wine per week   Drug use: No   Sexual activity: Yes    Birth control/protection: Post-menopausal  Other Topics Concern   Not on file  Social History Narrative   She is under a significant amount of stress socially.    Her husband committed suicide several years ago - she continues to have difficulty with coping.   Social Determinants of Health   Financial Resource Strain: Not on file  Food Insecurity: Not on file  Transportation Needs: Not on file  Physical Activity: Not on file  Stress: Not on file  Social Connections: Not on file  Intimate Partner Violence: Not on file     Review of Systems    General:  No chills, fever, night sweats or weight changes.  Cardiovascular:  No chest pain, dyspnea on exertion, edema, orthopnea, palpitations, paroxysmal nocturnal dyspnea. Dermatological: No rash, lesions/masses Respiratory: No cough, dyspnea Urologic: No hematuria, dysuria Abdominal:   No nausea, vomiting, diarrhea, bright red blood per rectum, melena, or hematemesis Neurologic:  No visual changes, wkns, changes in mental status. All other systems reviewed and are otherwise negative except as noted above.     Physical Exam    VS:  BP (!) 142/60 (BP Location: Left Arm, Patient Position: Sitting, Cuff Size: Normal)    Pulse 67    Ht _0  (1.727 m)    Wt 161 lb (73 kg)    SpO2 99%    BMI 24.48 kg/m  , BMI Body mass index is 24.48 kg/m.     GEN: Well nourished, well developed, in no acute distress. HEENT: normal. Neck: Supple, no JVD, carotid bruits, or  masses. Cardiac: RRR, 1/6 systolic murmurs, rubs, or gallops. No clubbing, cyanosis, edema.  Radials/DP/PT 2+ and equal bilaterally.  Respiratory:  Respirations regular and unlabored, clear to auscultation bilaterally. GI: Soft, nontender, nondistended, BS + x 4. MS: no deformity or atrophy. Skin: warm and dry, no rash. Neuro:  Strength and sensation are intact. Psych: Normal affect.  Accessory Clinical Findings      Lab Results  Component Value Date   WBC 8.0 10/09/2021   HGB 13.2 10/09/2021   HCT 39.5 10/09/2021   MCV 95.6 10/09/2021   PLT 219 10/09/2021   Lab Results  Component Value Date   CREATININE 1.26 (H) 10/09/2021   BUN 18 10/09/2021   NA 139 10/09/2021   K 3.8 10/09/2021   CL 104 10/09/2021   CO2 24 10/09/2021   Lab Results  Component Value Date   ALT 19 10/09/2021   AST 24 10/09/2021   ALKPHOS 67 10/09/2021   BILITOT 0.6 10/09/2021   Lab Results  Component Value Date   CHOL 183 01/10/2017   HDL 74 01/10/2017   LDLCALC 82 01/10/2017   TRIG 133 01/10/2017   CHOLHDL 2.5 01/10/2017    Lab Results  Component Value Date   HGBA1C 5.9 (H) 11/09/2015    Review of Prior Studies:  Diagnostic Angiogram April 2017; Negative FFR of Ostial LAD lesion.  Patent RCA stents     Assessment & Plan   1.  Coronary artery disease: Status post PCI of the ostial and mid and distal RCA.  She  continues on secondary risk management.  Did have an episode of chest pain leading to ED evaluation and was ruled out for ACS.  No plans to repeat any cardiac testing at this time, unless symptoms are persistent.  2.  Mild to moderate aortic valve regurgitation: Currently asymptomatic for shortness of breath, palpitations, but has had 1 episode of chest discomfort which was not determined in to be cardiac in etiology.  She will have a follow-up echocardiogram in November 2023.  We did discuss possible TAVR in the distant future.  But this, of course, will be under the discretion of Dr.  Ellyn Herrera concerning his plan for her.  3.  Hypertension: Blood pressure has been fairly stable with exception of a couple of times when she was under stress or when she was feeling chest discomfort leading to the ED evaluation.  No changes in her medication regimen.  She does have nitroglycerin to use as needed which she has not had to do even recently prior to ED.  4.  Newly diagnosed Alpha Gal syndrome: I did suggest meeting with a nutritionist to help her with the diet.  She should follow-up with PCP and/or GI concerning this issue.  Current medicines are reviewed at length with the patient today.  I have spent 30 min's  dedicated to the care of this patient on the date of this encounter to include pre-visit review of records, assessment, management and diagnostic testing,with shared decision making.  Signed, Phill Myron. West Pugh, ANP, AACC   10/14/2021 5:26 PM    [facility] [place] [address] Office [phone] Fax [phone]  Notice: This dictation was prepared with Dragon dictation along with smaller phrase technology. Any transcriptional errors that result from this process are unintentional and may not be corrected upon review.

## 2021-10-14 ENCOUNTER — Encounter: Payer: Self-pay | Admitting: Adult Health

## 2021-10-14 ENCOUNTER — Other Ambulatory Visit: Payer: Self-pay

## 2021-10-14 ENCOUNTER — Ambulatory Visit (INDEPENDENT_AMBULATORY_CARE_PROVIDER_SITE_OTHER): Payer: Medicare Other | Admitting: Adult Health

## 2021-10-14 VITALS — BP 142/60 | HR 67 | Ht 68.0 in | Wt 161.0 lb

## 2021-10-14 DIAGNOSIS — I10 Essential (primary) hypertension: Secondary | ICD-10-CM | POA: Diagnosis not present

## 2021-10-14 DIAGNOSIS — I351 Nonrheumatic aortic (valve) insufficiency: Secondary | ICD-10-CM | POA: Diagnosis not present

## 2021-10-14 DIAGNOSIS — I2510 Atherosclerotic heart disease of native coronary artery without angina pectoris: Secondary | ICD-10-CM

## 2021-10-14 DIAGNOSIS — Z91018 Allergy to other foods: Secondary | ICD-10-CM | POA: Diagnosis not present

## 2021-10-14 DIAGNOSIS — Z9861 Coronary angioplasty status: Secondary | ICD-10-CM

## 2021-10-14 NOTE — Patient Instructions (Addendum)
Medication Instructions:  ?No changes ?*If you need a refill on your cardiac medications before your next appointment, please call your pharmacy* ? ? ?Lab Work: ?None ?If you have labs (blood work) drawn today and your tests are completely normal, you will receive your results only by: ?MyChart Message (if you have MyChart) OR ?A paper copy in the mail ?If you have any lab test that is abnormal or we need to change your treatment, we will call you to review the results. ? ? ?Testing/Procedures: [address]. ?Your physician has requested that you have an echocardiogram. Echocardiography is a painless test that uses sound waves to create images of your heart. It provides your doctor with information about the size and shape of your heart and how well your heart?s chambers and valves are working. This procedure takes approximately one hour. There are no restrictions for this procedure.  ? ? ?Follow-Up: ?At [facility], you and your health needs are our priority.  As part of our continuing mission to provide you with exceptional heart care, we have created designated Provider Care Teams.  These Care Teams include your primary Cardiologist (physician) and Advanced Practice Providers (APPs -  Physician Assistants and Nurse Practitioners) who all work together to provide you with the care you need, when you need it. ? ?We recommend signing up for the patient portal called "MyChart".  Sign up information is provided on this After Visit Summary.  MyChart is used to connect with patients for Virtual Visits (Telemedicine).  Patients are able to view lab/test results, encounter notes, upcoming appointments, etc.  Non-urgent messages can be sent to your provider as well.   ?To learn more about what you can do with MyChart, go to [email or web address].   ? ?Your next appointment:   ?November 2023 ? ?The format for your next appointment:   ?In Person ? ?Provider:   ?[name], MD   ? ?  ?

## 2021-11-01 NOTE — Progress Notes (Addendum)
I was able to review the patient's chart including my note from her visit.  The BNP was ordered because she has a history of coronary artery disease with prior MI and PCI, aortic valve disease, and was presenting with both chest pain and fatigue which could be indicating new heart failure.  The BNP was abnormal and elevated.  Patient was able to discharged and has subsequently followed up with cardiology.  This order was included to rule out acute heart failure as the cause of her symptoms in that emergency department visit.  ?

## 2021-11-21 ENCOUNTER — Ambulatory Visit: Payer: Medicare Other | Admitting: Cardiology

## 2022-01-27 DIAGNOSIS — D2271 Melanocytic nevi of right lower limb, including hip: Secondary | ICD-10-CM | POA: Diagnosis not present

## 2022-01-27 DIAGNOSIS — D2272 Melanocytic nevi of left lower limb, including hip: Secondary | ICD-10-CM | POA: Diagnosis not present

## 2022-01-27 DIAGNOSIS — L509 Urticaria, unspecified: Secondary | ICD-10-CM | POA: Diagnosis not present

## 2022-01-27 DIAGNOSIS — L929 Granulomatous disorder of the skin and subcutaneous tissue, unspecified: Secondary | ICD-10-CM | POA: Diagnosis not present

## 2022-01-27 DIAGNOSIS — D485 Neoplasm of uncertain behavior of skin: Secondary | ICD-10-CM | POA: Diagnosis not present

## 2022-03-02 ENCOUNTER — Telehealth: Payer: Self-pay | Admitting: *Deleted

## 2022-03-02 NOTE — Patient Outreach (Signed)
  Care Management   Outreach Note  03/02/2022  Name: AMRA SHUKLA MRN: [id number] DOB: 07/01/1951  Reason for Referral: Care Coordination - Assessment of Needs.   An unsuccessful telephone outreach was attempted today. The patient was referred to the case management team for assistance with care management and care coordination. A HIPAA compliant message was left on voicemail for patient, providing contact information for CSW, encouraging patient to return the call at her earliest convenience.   Follow Up Plan:  Scheduling Care Guide will reschedule initial telephone outreach call for patient with CSW.   Danford Bad, BSW, MSW, LCSW  Licensed Restaurant manager, [employer] [facility]  Mailing [place] [address] [place], [place] [address] Physical Address[address], [place], [place] [address] Toll Free Main # [phone] Fax # [phone] Cell # [phone] [email or web address]

## 2022-03-13 ENCOUNTER — Other Ambulatory Visit: Payer: Self-pay | Admitting: *Deleted

## 2022-03-13 NOTE — Patient Outreach (Signed)
  Care Coordination   03/13/2022  Name: Laura Herrera MRN: [id number] DOB: [birth year]   Care Coordination Outreach Attempts:  A second unsuccessful outreach was attempted today to offer the patient with information about available care coordination services as a benefit of their health plan.   A HIPAA compliant message was left on voicemail, providing contact information for CSW, encouraging patient to return CSW's call at her earliest convenience.    Follow Up Plan:  Additional outreach attempts will be made to offer the patient care coordination information and services.   Encounter Outcome:  No Answer.   Care Coordination Interventions Activated:  No    Care Coordination Interventions:  No, not indicated.    Danford Bad, BSW, MSW, LCSW  Licensed Restaurant manager, [employer] food [facility]  Mailing [place] [address] [place], [place] [address] Physical Address[address] [place], [place] [address] Toll Free Main # [phone] Fax # [phone] Cell # [phone] [email or web address]

## 2022-03-15 DIAGNOSIS — F5101 Primary insomnia: Secondary | ICD-10-CM | POA: Diagnosis not present

## 2022-03-15 DIAGNOSIS — I10 Essential (primary) hypertension: Secondary | ICD-10-CM | POA: Diagnosis not present

## 2022-03-15 DIAGNOSIS — Z79899 Other long term (current) drug therapy: Secondary | ICD-10-CM | POA: Diagnosis not present

## 2022-03-15 DIAGNOSIS — I2510 Atherosclerotic heart disease of native coronary artery without angina pectoris: Secondary | ICD-10-CM | POA: Diagnosis not present

## 2022-03-15 DIAGNOSIS — R7301 Impaired fasting glucose: Secondary | ICD-10-CM | POA: Diagnosis not present

## 2022-03-15 DIAGNOSIS — E7800 Pure hypercholesterolemia, unspecified: Secondary | ICD-10-CM | POA: Diagnosis not present

## 2022-03-15 DIAGNOSIS — Z0000 Encounter for general adult medical examination without abnormal findings: Secondary | ICD-10-CM | POA: Diagnosis not present

## 2022-03-20 ENCOUNTER — Other Ambulatory Visit: Payer: Self-pay | Admitting: *Deleted

## 2022-03-20 NOTE — Patient Outreach (Signed)
  Care Coordination   03/20/2022  Name: Laura Herrera MRN: [id number] DOB: 1951-06-14   Care Coordination Outreach Attempts:  A third unsuccessful outreach was attempted today to offer the patient with information about available care coordination services as a benefit of their health plan. HIPAA compliant messages left on voicemail, providing contact information for CSW, encouraging patient to return CSW's call at her earliest convenience.  Follow Up Plan:  No further outreach attempts will be made at this time. We have been unable to contact the patient to offer or enroll patient in care coordination services  Encounter Outcome:  No Answer.   Care Coordination Interventions Activated:  No.    Care Coordination Interventions:  No, not indicated.    Danford Bad, BSW, MSW, LCSW  Licensed Restaurant manager, [employer] food [facility]  Mailing [place] [address] [place], [place] [address] Physical [address] [place], [place] [address] Toll Free Main # [phone] Fax # [phone] Cell # [phone] [email or web address]

## 2022-03-26 DIAGNOSIS — R509 Fever, unspecified: Secondary | ICD-10-CM | POA: Diagnosis not present

## 2022-03-26 DIAGNOSIS — R300 Dysuria: Secondary | ICD-10-CM | POA: Diagnosis not present

## 2022-04-03 DIAGNOSIS — K449 Diaphragmatic hernia without obstruction or gangrene: Secondary | ICD-10-CM | POA: Diagnosis not present

## 2022-04-03 DIAGNOSIS — K219 Gastro-esophageal reflux disease without esophagitis: Secondary | ICD-10-CM | POA: Diagnosis not present

## 2022-05-02 ENCOUNTER — Ambulatory Visit (HOSPITAL_COMMUNITY): Payer: Medicare Other | Attending: Adult Health

## 2022-05-02 ENCOUNTER — Encounter (HOSPITAL_COMMUNITY): Payer: Self-pay

## 2022-05-02 NOTE — Progress Notes (Signed)
Verified appointment "no show" status with B. [place] at 15:58.

## 2022-05-16 ENCOUNTER — Ambulatory Visit (HOSPITAL_COMMUNITY): Payer: Medicare Other | Attending: Cardiology

## 2022-05-16 DIAGNOSIS — I351 Nonrheumatic aortic (valve) insufficiency: Secondary | ICD-10-CM | POA: Insufficient documentation

## 2022-05-16 LAB — ECHOCARDIOGRAM COMPLETE
Area-P 1/2: 3.51 cm2
P 1/2 time: 393 msec
S' Lateral: 2.5 cm

## 2022-05-24 ENCOUNTER — Telehealth: Payer: Self-pay

## 2022-05-24 NOTE — Telephone Encounter (Addendum)
Called patient regarding results. Patient had understanding of results. Patient advised of scheduled appointment for October 31 at 1:30 with Dr. Ellyn Hack. ----- Message from Lendon Colonel, NP sent at 05/24/2022 10:50 AM EDT ----- Reviewed results of echo Patient should be scheduled with Dr. Ellyn Hack on follow up to discuss referral to surgery to repair AoV. Please schedule with him first available  KL

## 2022-05-30 ENCOUNTER — Other Ambulatory Visit: Payer: Self-pay | Admitting: Cardiology

## 2022-05-30 ENCOUNTER — Other Ambulatory Visit: Payer: Self-pay

## 2022-05-30 MED ORDER — METOPROLOL SUCCINATE ER 25 MG PO TB24: ORAL_TABLET | ORAL | 0 refills | Status: DC

## 2022-05-30 NOTE — Telephone Encounter (Signed)
Rx refill sent to pharmacy. 

## 2022-06-06 ENCOUNTER — Encounter: Payer: Self-pay | Admitting: Cardiology

## 2022-06-06 ENCOUNTER — Ambulatory Visit: Payer: Medicare Other | Attending: Cardiology | Admitting: Cardiology

## 2022-06-06 VITALS — BP 162/66 | HR 63 | Ht 68.0 in | Wt 165.0 lb

## 2022-06-06 DIAGNOSIS — R0609 Other forms of dyspnea: Secondary | ICD-10-CM

## 2022-06-06 DIAGNOSIS — Z9861 Coronary angioplasty status: Secondary | ICD-10-CM | POA: Diagnosis not present

## 2022-06-06 DIAGNOSIS — E785 Hyperlipidemia, unspecified: Secondary | ICD-10-CM

## 2022-06-06 DIAGNOSIS — I2119 ST elevation (STEMI) myocardial infarction involving other coronary artery of inferior wall: Secondary | ICD-10-CM

## 2022-06-06 DIAGNOSIS — I10 Essential (primary) hypertension: Secondary | ICD-10-CM | POA: Diagnosis not present

## 2022-06-06 DIAGNOSIS — I2510 Atherosclerotic heart disease of native coronary artery without angina pectoris: Secondary | ICD-10-CM

## 2022-06-06 DIAGNOSIS — I25111 Atherosclerotic heart disease of native coronary artery with angina pectoris with documented spasm: Secondary | ICD-10-CM | POA: Diagnosis not present

## 2022-06-06 DIAGNOSIS — I351 Nonrheumatic aortic (valve) insufficiency: Secondary | ICD-10-CM

## 2022-06-06 MED ORDER — ROSUVASTATIN CALCIUM 40 MG PO TABS: 40.0000 mg | ORAL_TABLET | Freq: Every day | ORAL | 3 refills | Status: DC

## 2022-06-06 NOTE — Patient Instructions (Signed)
Medication Instructions:  Stop Atorvastatin    Start taking rosuvastatin  40 mg daily at bedtime   *If you need a refill on your cardiac medications before your next appointment, please call your pharmacy*   Lab Work: Not needed    Testing/Procedures:  Not needed  Follow-Up: At [facility], you and your health needs are our priority.  As part of our continuing mission to provide you with exceptional heart care, we have created designated Provider Care Teams.  These Care Teams include your primary Cardiologist (physician) and Advanced Practice Providers (APPs -  Physician Assistants and Nurse Practitioners) who all work together to provide you with the care you need, when you need it.     Your next appointment:   6 month(s)  The format for your next appointment:   In Person  Provider:   Glenetta Hew, MD

## 2022-06-06 NOTE — Progress Notes (Signed)
Primary Care Provider: Lujean Amel, [place] Cardiologist: Glenetta Hew, MD Electrophysiologist: None  Clinic Note: Chief Complaint  Patient presents with   Follow-up    67-monthfollow-up, echo results   Aortic regurgitation    Reviewed results of echo   Coronary Artery Disease    No angina    ===================================  ASSESSMENT/PLAN   Problem List Items Addressed This Visit       Cardiology Problems   CAD S/P PCI of the ostial and mid/distal RCA - Primary (Chronic)    Long ways out from her MI.  No further anginal symptoms.  She has had stress evaluation as well as FFR of her LAD lesion showing nonobstructive stenosis.  She does have some mild fleeting chest discomfort symptoms but not necessarily exertional.  Continue to monitor.  Currently on Lipitor 40 mg daily with lipids not at goal, will change to Crestor 40 mg. Continue Toprol 12.5 mg daily-with resting heart rate in 60s, not able to titrate further) consider carvedilol if blood pressure needs to be treated.  Continue Norvasc. Continue on aspirin monotherapy, not currently on Plavix.       Relevant Medications   rosuvastatin (CRESTOR) 40 MG tablet   Other Relevant Orders   EKG 12-Lead (Completed)   Coronary artery disease with angina pectoris with documented spasm (HCC) (Chronic)    With documented spasm noted on cardiac catheterization, we will switch her from ARB to calcium channel blocker.  Otherwise no change.      Relevant Medications   rosuvastatin (CRESTOR) 40 MG tablet   Other Relevant Orders   EKG 12-Lead (Completed)   ST elevation myocardial infarction (STEMI) of inferior wall, subsequent episode of care ([facility] (Chronic)    Distant history of MI, RCA PCI.  EF preserved.  No active heart failure symptoms no further anginal symptoms.  Has been evaluated with testing and nonischemic.  Echo does not suggest significant inferior wall motion abnormality.      Relevant  Medications   rosuvastatin (CRESTOR) 40 MG tablet   Hyperlipidemia with target LDL less than 70 (Chronic)    Not at goal.  From Lipitor to Crestor 40 mg.  Recheck labs in 3 months.  If not at goal, would probably consider adding Nexletol versus refer for SGLT2 inhibitor/inclisiran.      Relevant Medications   rosuvastatin (CRESTOR) 40 MG tablet   Aortic valve insufficiency (Chronic)    Probably has progression of AI to moderate at least.  Continue to monitor every year.  She just has a little bit of exertional dyspnea.  As long as the LV function and LV IDD is still stable, would hold off on referral for aortic valve replacement. Continue to treat blood pressure.  Elevated today, but not usually at home.      Relevant Medications   rosuvastatin (CRESTOR) 40 MG tablet   Essential hypertension (Chronic)    Blood pressure not ideally controlled today.  She seems a little stressed today.  She will keep track of her blood pressures at home, low threshold to increase Norvasc to 10 mg and potentially consider converting from Toprol to carvedilol for better blood pressure control.      Relevant Medications   rosuvastatin (CRESTOR) 40 MG tablet     Other   Exertional dyspnea    She seems to be pretty deconditioned.  I think this is unlikely with her dyspnea.  Grocery continue exercising so we can continue to monitor and evaluate for symptoms  concerning for progression of aortic regurgitation.       ===================================  HPI:    Laura Herrera is a 71 y.o. female with a PMH below who presents today for 6-24-monthfollow-up.  PMH  CAD-PCI (inferior STEMI, DES PCI to RCA x2-June 2016):  Existing LAD lesion - FFR negative in 2017.  Medical management.  No active angina HTN,  HLD,  PAD -> stenosis of isolated artery. Moderate Aortic Valve Regurgitation-most recent echo reviewed below. Alpha-Gal syndrome  I last saw Laura Herrera November 2022 as 1 month follow-up to  discuss results of her echo.  (Official read showed moderate to severe AI, but on reassessment by Dr. CSallyanne Kuster felt to be mild to moderate.  She was doing well.  Somewhat stressed out but no active cardiac symptoms.  Intermittent brief episodes of chest discomfort but this really exertional and not consistently.  BP is a little bit elevated.  Laura LOFTONwas last seen on October 14, 2021 for 672-monthollow-up in response to an ER visit on 10/09/2021 with chest pain complaints of aching and dull nonradiating chest pain intermittently for 2 days prior to ER evaluation.  Ruled out for ACS.  Positive for UTI.  EKG unremarkable.  May be due.  T wave inversion in lead III.  CT scan suggested esophagitis and gastritis.  Referred to GI medicine.  Discussed echo.  Also noted that she was recently diagnosed with Alpha Gal Syndrome.  (Discussed discussion of diet with nutritionist).-Follow-up echo ordered for October/November 2023.  Recent Hospitalizations: None  Reviewed  CV studies:    The following studies were reviewed today: (if available, images/films reviewed: From Epic Chart or Care Everywhere) Echo 05/16/2022: EF 60 to 65%.  No RWMA.  Normal diastolic parameters.  Normal strain.  Mildly elevated BNP.  Mild MR with moderate MAC.  Read as moderate-severe AS.. Marland KitchenNormal RAP.  No Comparison(s): 06/13/21 EF 60-65%. Moderate-Severe AI.  Reviewed with Dr. WeEleonore Chiquito Does not appear to be severe AI.  More consistent with moderate AI.  Interval History:   Laura MONCUReturns today for 6-42-monthllow-up overall doing pretty well.  She does intermittently have fleeting chest discomfort here and there, but not associate with any activity or exertion.  She does get little short of breath walking up hills or up steps but not with routine activity.  Nothing to suggest her prior anginal symptoms.  No CHF symptoms of PND, orthopnea or edema. CV Review of Symptoms (Summary): positive for - chest pain, dyspnea  on exertion, and this was stable.  Chest pain is fleeting, not consistent with angina.  Mostly at rest and with stress. negative for - edema, irregular heartbeat, orthopnea, palpitations, paroxysmal nocturnal dyspnea, rapid heart rate, shortness of breath, or syncope/near syncope or TIA/amaurosis fugax, claudication  REVIEWED OF SYSTEMS   Review of Systems  Constitutional:  Positive for weight loss (Labile weight.  Has been up and down.). Negative for malaise/fatigue.  HENT:  Negative for congestion.   Respiratory:  Positive for shortness of breath (Only with increased exertion-rushing up steps or uphill.). Negative for cough.   Cardiovascular:        Per HPI  Gastrointestinal:  Negative for abdominal pain, blood in stool and melena.  Genitourinary:  Negative for hematuria.  Musculoskeletal:  Positive for joint pain (Off-and-on joint pain). Negative for myalgias.  Neurological:  Negative for dizziness and focal weakness.  Psychiatric/Behavioral:  Negative for depression and memory loss. The patient is nervous/anxious. The  patient does not have insomnia.    I have reviewed and (if needed) personally updated the patient's problem list, medications, allergies, past medical and surgical history, social and family history.   PAST MEDICAL HISTORY   Past Medical History:  Diagnosis Date   Aortic valve insufficiency 06/2021   Echo read as normal EF, normal wall motions.  Severe MAC.  Moderate to severe AI.=> On review with imaging cardiologist, this appears to be an over estimation.  More consistent with mild to moderate AI.   CAD S/P percutaneous coronary angioplasty 01/2015   a. 01/2015 Inf STEMI/PCI: ostRCA 80% (Promus DES 2.75x12 - 3.1 mm), 100% & 70% p-mRCA covered with 1 DES (Promus DES 2.5x24  - 2.8 mm ), dRCA 90%(PTCA) -10% ;  b. 01/2015 Relook: LM nl, mLAD 20%, LCX nl- L->R collats, Thrombus in p-mRCA stent->Aggrastat;  c. 01/2015 Echo: EF 60-65%, no RWMA, mod AI, PAP 49mHg. d. 11/2015:  CATH: patent stents, FFR neg LM-LAD   Chest pain with high risk for cardiac etiology 11/19/2015   Cardiac catheterization showed stable coronaries with patent RCA stents. FFR of LAD negative. --> LV gram EF suggested Takotsubo versus LAD spasm.   Complication of anesthesia    pseudo cholinesterace deficiency   Depression    Dyslipidemia    Generalized anxiety disorder    Currently being treated only with PRN Xanax for panic attacks.   Headache    "used to have them daily; I no longer do" (11/09/2015)   Hypertension    Insomnia    Kidney stones    "years ago; passed it"   MRSA infection    h/o MRSA infection in the right foot   NSTEMI (non-ST elevated myocardial infarction) ([facility] 11/09/2015   No obvious culprit lesion found.  Thought to be potentially Takotsubo.   PONV (postoperative nausea and vomiting)    Pseudocholinesterase deficiency    Psoriasis    hand and foot (11/09/2015)   ST elevation myocardial infarction (STEMI) of inferior wall ([place] 01/2015   Severe RCA disease.  Ostial to proximal.  Single stent with PTCA of distal RCA and the PDA..Marland Kitchen   PAST SURGICAL HISTORY   Past Surgical History:  Procedure Laterality Date   ANGIOPLASTY  01/12/2015   Procedure: Angioplasty;  Surgeon: HBelva Crome MD;  [facility];  Service: Cardiovascular;; Unable to wire RCA -- treated plaque protrusion in pRCA stent with Aggrastat infusion.   APPENDECTOMY     BREAST BIOPSY     CARDIAC CATHETERIZATION N/A 01/11/2015   Procedure: Left Heart Cath and Coronary Angiography;  Surgeon: DLeonie Man MD;  [facility];  Service: Cardiovascular;  ost RCA 80% (calcified), pRCA 100% (followed by 70% tandem lesion), dRCA 90%.   Mild to moderate disease in LAD and circumflex   CARDIAC CATHETERIZATION  01/11/2015   Procedure: Coronary Stent Intervention;  Surgeon: DLeonie Man MD;  [facility];  Service: Cardiovascular;; ost RCA 80%- cutting balloon  PTCA-> DES PCI (2.75 x 12 Promus DES - 3.1 mm); pRCA (covering tandem 100% -7-%) DES PCI (Promus DES 2.5 x 24 --> 2.8 mm)   CARDIAC CATHETERIZATION  01/11/2015   Procedure: Coronary Balloon Angioplasty;  Surgeon: DLeonie Man MD;  [facility];  Service: Cardiovascular;;PTCA of dRCA 90% --> 10%.   CARDIAC CATHETERIZATION N/A 01/12/2015   Procedure: Left Heart Cath and Coronary Angiography;  Surgeon: HBelva Crome MD;  [facility];  Service: Cardiovascular;; Relook Cath for recurrent CP post STEMI PCI --> difficult to engage ostial RCA stent (unable to wire) - plaque protrusion in pRCA stent --> Rx with Aggrastat infusion.   CARDIAC CATHETERIZATION N/A 11/10/2015   Procedure: Left Heart Cath and Coronary Angiography;  Surgeon: Troy Sine, MD;  Location: [facility];  Service: Cardiovascular; patent RCA stents. Ostial LM 30%, ostial LAD 40% - FFR negative. Moderate-severe LV dysfunction with hypo-kinesis involving the anterior wall to the apex. EF suggested 30%. (? Takotsubo)   CARDIAC CATHETERIZATION N/A 11/10/2015   Procedure: Intravascular Pressure Wire/FFR Study;  Surgeon: Troy Sine, MD;  Location: [facility];  Service: Cardiovascular;  FFR of LAD negative.   DILATION AND CURETTAGE OF UTERUS  1979   S/P miscarriage   INCISION AND DRAINAGE FOOT Right    "spider bite"   NOSE SURGERY  X 2   "to remove genetic bone spur"   TRANSTHORACIC ECHOCARDIOGRAM  11/11/2015   Normal LV size and function. Vigorous function with EF 6570%. GR 1 DD. Mild-moderate MR.   TRANSTHORACIC ECHOCARDIOGRAM  06/13/2021   EF 60 to 65%.  No R WMA.  Unable to assess EF.  Normal RV size function.  Normal RV pressures.  Severe MAC with no MR or MS. "  Moderate-severe" AI - overread as Mild-Moderate (Dr. Sallyanne Kuster).    Diagnostic Angiogram April 2017; Negative FFR of Ostial LAD lesion.  Patent RCA stents   Immunization History  Administered Date(s) Administered    PFIZER(Purple Top)SARS-COV-2 Vaccination 10/28/2019    MEDICATIONS/ALLERGIES   Current Meds  Medication Sig   ALPRAZolam (XANAX) 0.25 MG tablet TAKE 1 TABLET TWICE A DAY AS NEEDED FOR ANXIETY   amLODipine (NORVASC) 5 MG tablet TAKE 1 TABLET(5 MG) BY MOUTH DAILY   aspirin EC 81 MG tablet Take 1 tablet (81 mg total) by mouth daily.   metoprolol succinate (TOPROL-XL) 25 MG 24 hr tablet Take 0.5 tablets (12.5 mg total) by mouth daily. TAKE HALF TABLET BY MOUTH EVERY DAY. PLEASE ATTEND SCHEDULE APPOINTMENT FOR ADDITIONAL REFILLS   pantoprazole (PROTONIX) 40 MG tablet TAKE 1 TABLET BY MOUTH DAILY AT 12 NOON.   rosuvastatin (CRESTOR) 40 MG tablet Take 1 tablet (40 mg total) by mouth daily.   triamcinolone cream (KENALOG) 0.1 % as needed.   [DISCONTINUED] atorvastatin (LIPITOR) 40 MG tablet Take 1 tablet (40 mg total) by mouth daily.    Allergies  Allergen Reactions   Cholinesterase Inhibitors Other (See Comments)    Enzyme deficiency which causes muscle paralyses after receiving anesthesia.   Other Other (See Comments) and Rash    Sensitive to the electrodes Tears skin off, and "paper" tape also   Adhesive [Tape] Other (See Comments)    Tears skin off, and "paper" tape also   Shellfish-Derived Products Swelling    Crabs, lobster, crayfish; tissues in joints swelled   Epinephrine Palpitations    Palpitations and involuntary body jerking   Vancomycin Hives and Rash    severe     SOCIAL HISTORY/FAMILY HISTORY   Reviewed in Epic:  Pertinent findings:  Social History   Tobacco Use   Smoking status: Former    Packs/day: 0.10    Years: 2.00    Total pack years: 0.20    Types: Cigarettes   Smokeless tobacco: Never   Tobacco comments:    "quit smoking in the 1980s"  Substance Use Topics   Alcohol use: Yes    Alcohol/week: 2.0 standard drinks  of alcohol    Types: 2 Glasses of wine per week   Drug use: No   Social History   Social History Narrative   She is under a significant  amount of stress socially.    Her husband committed suicide several years ago - she continues to have difficulty with coping.    OBJCTIVE -PE, EKG, labs   Wt Readings from Last 3 Encounters:  06/06/22 165 lb (74.8 kg)  10/14/21 161 lb (73 kg)  10/09/21 157 lb (71.2 kg)    Physical Exam: BP (!) 162/66 (BP Location: Left Arm, Patient Position: Sitting, Cuff Size: Normal)   Pulse 63   Ht _0  (1.727 m)   Wt 165 lb (74.8 kg)   SpO2 98%   BMI 25.09 kg/m  Physical Exam Vitals reviewed.  Constitutional:      General: She is not in acute distress.    Appearance: Normal appearance. She is normal weight. She is not ill-appearing or toxic-appearing.  HENT:     Head: Normocephalic and atraumatic.  Neck:     Vascular: No carotid bruit or JVD.  Cardiovascular:     Rate and Rhythm: Normal rate and regular rhythm. Occasional Extrasystoles are present.    Chest Wall: PMI is not displaced.     Pulses: Normal pulses and intact distal pulses.     Heart sounds: S1 normal and S2 normal. No midsystolic click. Murmur heard.     High-pitched harsh crescendo-decrescendo early systolic murmur is present with a grade of 2/6 at the upper right sternal border radiating to the neck.     High-pitched blowing decrescendo early diastolic murmur is present with a grade of 1/4 at the upper right sternal border radiating to the apex.     No friction rub. No gallop.  Pulmonary:     Effort: Pulmonary effort is normal. No respiratory distress.     Breath sounds: Normal breath sounds. No wheezing, rhonchi or rales.  Chest:     Chest wall: No tenderness.  Musculoskeletal:        General: No swelling or tenderness. Normal range of motion.     Cervical back: Normal range of motion and neck supple.  Skin:    General: Skin is warm and dry.  Neurological:     General: No focal deficit present.     Mental Status: She is alert and oriented to person, place, and time.     Gait: Gait normal.  Psychiatric:         Mood and Affect: Mood normal.        Behavior: Behavior normal.        Thought Content: Thought content normal.        Judgment: Judgment normal.     Adult ECG Report  Rate: 63 ;  Rhythm: normal sinus rhythm and inferior MI, age-indeterminate. ;   Narrative Interpretation: Stable  Recent Labs:   03/15/2022: TC 207, TG 146, HDL 88, LDL 95.  A1c 5.2.  Hgb 13.7, Cr 1.29, K+ 4.8, TSH 2.25. Lab Results  Component Value Date   CHOL 183 01/10/2017   HDL 74 01/10/2017   LDLCALC 82 01/10/2017   TRIG 133 01/10/2017   CHOLHDL 2.5 01/10/2017   Lab Results  Component Value Date   CREATININE 1.26 (H) 10/09/2021   BUN 18 10/09/2021   NA 139 10/09/2021   K 3.8 10/09/2021   CL 104 10/09/2021   CO2 24 10/09/2021      Latest Ref Rng &  Units 10/09/2021    3:47 PM 03/05/2016    6:32 PM 11/18/2015   10:56 PM  CBC  WBC 4.0 - 10.5 K/uL 8.0  7.3  7.5   Hemoglobin 12.0 - 15.0 g/dL 13.2  11.0  10.4   Hematocrit 36.0 - 46.0 % 39.5  35.0  32.7   Platelets 150 - 400 K/uL 219  280  348     Lab Results  Component Value Date   HGBA1C 5.9 (H) 11/09/2015   Lab Results  Component Value Date   TSH 1.665 10/09/2021    ================================================== I spent a total of 31 minutes with the patient spent in direct patient consultation.  Additional time spent with chart review  / charting (studies, outside notes, etc): 26 min Total Time: 57 min . Current medicines are reviewed at length with the patient today.  (+/- concerns) none  Notice: This dictation was prepared with Dragon dictation along with smart phrase technology. Any transcriptional errors that result from this process are unintentional and may not be corrected upon review.  Studies Ordered:   Orders Placed This Encounter  Procedures   EKG 12-Lead   Meds ordered this encounter  Medications   rosuvastatin (CRESTOR) 40 MG tablet    Sig: Take 1 tablet (40 mg total) by mouth daily.    Dispense:  90 tablet    Refill:  3     Discontinue  atorvastatin    Patient Instructions / Medication Changes & Studies & Tests Ordered   Patient Instructions  Medication Instructions:  Stop Atorvastatin    Start taking rosuvastatin  40 mg daily at bedtime   *If you need a refill on your cardiac medications before your next appointment, please call your pharmacy*   Lab Work: Not needed    Testing/Procedures:  Not needed  Follow-Up: At [facility], you and your health needs are our priority.  As part of our continuing mission to provide you with exceptional heart care, we have created designated Provider Care Teams.  These Care Teams include your primary Cardiologist (physician) and Advanced Practice Providers (APPs -  Physician Assistants and Nurse Practitioners) who all work together to provide you with the care you need, when you need it.     Your next appointment:   6 month(s)  The format for your next appointment:   In Person  Provider:   Glenetta Hew, MD       Leonie Man, MD, MS Glenetta Hew, M.D., M.S. Interventional Cardiologist  [place]  Pager # [phone] Phone # [phone] [address] [place], [place] [address]   Thank you for choosing [facility]!!

## 2022-06-10 ENCOUNTER — Encounter: Payer: Self-pay | Admitting: Cardiology

## 2022-06-10 NOTE — Assessment & Plan Note (Signed)
Not at goal.  From Lipitor to Crestor 40 mg.  Recheck labs in 3 months.  If not at goal, would probably consider adding Nexletol versus refer for SGLT2 inhibitor/inclisiran.

## 2022-06-10 NOTE — Assessment & Plan Note (Addendum)
Distant history of MI, RCA PCI.  EF preserved.  No active heart failure symptoms no further anginal symptoms.  Has been evaluated with testing and nonischemic.  Echo does not suggest significant inferior wall motion abnormality.

## 2022-06-10 NOTE — Assessment & Plan Note (Signed)
Long ways out from her MI.  No further anginal symptoms.  She has had stress evaluation as well as FFR of her LAD lesion showing nonobstructive stenosis.  She does have some mild fleeting chest discomfort symptoms but not necessarily exertional.  Continue to monitor.  Currently on Lipitor 40 mg daily with lipids not at goal, will change to Crestor 40 mg. Continue Toprol 12.5 mg daily-with resting heart rate in 60s, not able to titrate further) consider carvedilol if blood pressure needs to be treated.  Continue Norvasc. Continue on aspirin monotherapy, not currently on Plavix.

## 2022-06-10 NOTE — Assessment & Plan Note (Signed)
With documented spasm noted on cardiac catheterization, we will switch her from ARB to calcium channel blocker.  Otherwise no change.

## 2022-06-10 NOTE — Assessment & Plan Note (Signed)
She seems to be pretty deconditioned.  I think this is unlikely with her dyspnea.  Grocery continue exercising so we can continue to monitor and evaluate for symptoms concerning for progression of aortic regurgitation.

## 2022-06-10 NOTE — Assessment & Plan Note (Signed)
Probably has progression of AI to moderate at least.  Continue to monitor every year.  She just has a little bit of exertional dyspnea.  As long as the LV function and LV IDD is still stable, would hold off on referral for aortic valve replacement. Continue to treat blood pressure.  Elevated today, but not usually at home.

## 2022-06-10 NOTE — Assessment & Plan Note (Signed)
Blood pressure not ideally controlled today.  She seems a little stressed today.  She will keep track of her blood pressures at home, low threshold to increase Norvasc to 10 mg and potentially consider converting from Toprol to carvedilol for better blood pressure control.

## 2022-06-14 ENCOUNTER — Telehealth: Payer: Self-pay | Admitting: Cardiology

## 2022-06-14 DIAGNOSIS — I358 Other nonrheumatic aortic valve disorders: Secondary | ICD-10-CM

## 2022-06-14 DIAGNOSIS — I351 Nonrheumatic aortic (valve) insufficiency: Secondary | ICD-10-CM

## 2022-06-14 NOTE — Telephone Encounter (Signed)
Pt is requesting call back in regards to echo results she went over with Dr. Herbie Baltimore. She states Dr. Herbie Baltimore was suppose to consult another doctor and get back to her with updates. She is wanting to know if there are updates. Requesting call back.

## 2022-06-14 NOTE — Telephone Encounter (Signed)
Will send to MD to review.   Thanks!

## 2022-06-21 NOTE — Telephone Encounter (Signed)
Haven't forgotten -- just have not yet heard back from colleague.    Will be back@ [facility] on Friday 11/17 - will hope to get one of the imaging team to review for me as another opinion.    DH

## 2022-06-22 NOTE — Telephone Encounter (Signed)
Called patient, LVM advising of message below from MD.   Left call back number if further questions/concerns.

## 2022-06-23 NOTE — Telephone Encounter (Signed)
I reviewed the echocardiogram with one of our imaging experts.  Her feeling was that indeed it probably is moderate to severe aortic insufficiency, but the left ventricle function still looks preserved and the size still looks normal.  In this situation would like to see does the left ventricle become more dilated, or does the pump function reduced.  If that starts happening then we start worrying about the regurgitation, otherwise we simply treat with afterload reduction (blood pressure control) and keeping the heart rate down.  Trying to avoid arrhythmias.  Would like to recheck 2D echo in 6 months as opposed to 1 year.  [name], MD

## 2022-06-27 NOTE — Telephone Encounter (Signed)
Patient is returning call. Please advise? 

## 2022-06-27 NOTE — Telephone Encounter (Signed)
Called no answer left message to call back - request to have  operator/scheduler to contact  RN.

## 2022-06-27 NOTE — Telephone Encounter (Signed)
The patient has been notified of the result and verbalized understanding.  All questions (if any) were answered. Tobin Chad, RN 06/27/2022 2:10 PM      Patient is aware  will need an Echo in 6 months - order placed.   F/u appointment schedule  11/26/21 at 1:20 pm  Patient verbalized understanding of both appointments

## 2022-06-27 NOTE — Telephone Encounter (Signed)
Called left message on voicemail to call back  to give information  received from Dr Herbie Baltimore

## 2022-07-18 ENCOUNTER — Telehealth: Payer: Self-pay | Admitting: Cardiology

## 2022-07-18 NOTE — Telephone Encounter (Signed)
Pt c/o BP issue: STAT if pt c/o blurred vision, one-sided weakness or slurred speech  1. What are your last 5 BP readings?   Middle of the day between 1-2pm  12/8: 140/47 hr 54 12/9: 148/52 hr56 12/10: 134/47  hr53 12/11: 146/45 hr54 12/12: 145/49  hr58   2. Are you having any other symptoms (ex. Dizziness, headache, blurred vision, passed out)? No   3. What is your BP issue? Pt states her bp bottom number has been low and she wants to know what to do. Pt denies any symptoms, please advise

## 2022-07-18 NOTE — Telephone Encounter (Signed)
Called patient, LVM to call back to discuss.  Let call back number.

## 2022-07-18 NOTE — Telephone Encounter (Signed)
The diastolic pressures being low as a result of the aortic insufficiency.  As we were monitoring on her echocardiogram.  We do not want the systolic pressures to be too low, so the numbers that she is having on these recordings is fine.  DH

## 2022-07-18 NOTE — Telephone Encounter (Signed)
Patient called back, advised that she has been noticing some things with her blood pressure readings. Patient does not have any symptoms to report, just wanted to make Dr.Harding aware.   Diastolic number normally is in the 50-60's, recently as been in the 40's.   She is currently taking Metoprolol Succinate 12.5 mg daily daily, and amlodipine 5 mg daily.   Advised I would have her continue to monitor bp at home- we would send over to MD to review and give his recommendations.   Thanks!

## 2022-07-19 NOTE — Telephone Encounter (Signed)
Called patient, LVM to call back to discuss message from MD.  Left call back number.   

## 2022-07-19 NOTE — Telephone Encounter (Signed)
Patient returned call, advised of message below.   Patient verbalized understanding will call back with any other questions concerns.

## 2022-07-25 DIAGNOSIS — L299 Pruritus, unspecified: Secondary | ICD-10-CM | POA: Diagnosis not present

## 2022-07-25 DIAGNOSIS — R21 Rash and other nonspecific skin eruption: Secondary | ICD-10-CM | POA: Diagnosis not present

## 2022-07-25 DIAGNOSIS — T781XXD Other adverse food reactions, not elsewhere classified, subsequent encounter: Secondary | ICD-10-CM | POA: Diagnosis not present

## 2022-07-25 DIAGNOSIS — J300 Vasomotor rhinitis: Secondary | ICD-10-CM | POA: Diagnosis not present

## 2022-08-15 ENCOUNTER — Other Ambulatory Visit: Payer: Self-pay | Admitting: Adult Health

## 2022-08-15 ENCOUNTER — Other Ambulatory Visit: Payer: Self-pay | Admitting: Cardiology

## 2022-08-30 ENCOUNTER — Telehealth: Payer: Self-pay | Admitting: *Deleted

## 2022-08-30 NOTE — Progress Notes (Signed)
  Care Coordination  Outreach Note  08/30/2022 Name: Laura Herrera MRN: [id number] DOB: Nov 21, 1950   Care Coordination Outreach Attempts: An unsuccessful telephone outreach was attempted today to offer the patient information about available care coordination services as a benefit of their health plan.   Follow Up Plan:  Additional outreach attempts will be made to offer the patient care coordination information and services.   Encounter Outcome:  No Answer  [place]  Direct Dial: [phone]

## 2022-09-04 NOTE — Progress Notes (Signed)
  Care Coordination   Note   09/04/2022 Name: Laura Herrera MRN: [id number] DOB: 07/27/1951  [name] is a 72 y.o. year old female who sees Koirala, Dibas, MD for primary care. I reached out to Alfonse Flavors by phone today to offer care coordination services.  [name] was given information about Care Coordination services today including:   The Care Coordination services include support from the care team which includes your Nurse Coordinator, Clinical Social Worker, or Pharmacist.  The Care Coordination team is here to help remove barriers to the health concerns and goals most important to you. Care Coordination services are voluntary, and the patient may decline or stop services at any time by request to their care team member.   Care Coordination Consent Status: Patient did not agree to participate in care coordination services at this time.    Encounter Outcome:  Pt. Refused  [place]  Direct Dial: [phone]

## 2022-09-06 ENCOUNTER — Other Ambulatory Visit: Payer: Self-pay | Admitting: Family Medicine

## 2022-09-06 DIAGNOSIS — Z1231 Encounter for screening mammogram for malignant neoplasm of breast: Secondary | ICD-10-CM

## 2022-10-17 DIAGNOSIS — Z91014 Allergy to mammalian meats: Secondary | ICD-10-CM | POA: Diagnosis not present

## 2022-10-24 ENCOUNTER — Ambulatory Visit: Payer: Medicare Other

## 2022-11-09 ENCOUNTER — Ambulatory Visit (HOSPITAL_COMMUNITY): Payer: Medicare Other | Attending: Cardiology

## 2022-11-09 DIAGNOSIS — I358 Other nonrheumatic aortic valve disorders: Secondary | ICD-10-CM | POA: Insufficient documentation

## 2022-11-09 DIAGNOSIS — I351 Nonrheumatic aortic (valve) insufficiency: Secondary | ICD-10-CM | POA: Insufficient documentation

## 2022-11-09 DIAGNOSIS — I083 Combined rheumatic disorders of mitral, aortic and tricuspid valves: Secondary | ICD-10-CM | POA: Diagnosis not present

## 2022-11-09 DIAGNOSIS — I5030 Unspecified diastolic (congestive) heart failure: Secondary | ICD-10-CM

## 2022-11-09 HISTORY — PX: TRANSTHORACIC ECHOCARDIOGRAM: SHX275

## 2022-11-13 ENCOUNTER — Other Ambulatory Visit: Payer: Self-pay | Admitting: Adult Health

## 2022-11-13 LAB — ECHOCARDIOGRAM COMPLETE
AR max vel: 2.1 cm2
AV Area VTI: 1.95 cm2
AV Area mean vel: 2.08 cm2
AV Mean grad: 6.5 mmHg
AV Peak grad: 12 mmHg
Ao pk vel: 1.73 m/s
Area-P 1/2: 3.42 cm2
P 1/2 time: 539 msec
S' Lateral: 3.3 cm

## 2022-11-17 ENCOUNTER — Other Ambulatory Visit (HOSPITAL_COMMUNITY): Payer: Medicare Other

## 2022-11-20 ENCOUNTER — Telehealth: Payer: Self-pay | Admitting: Cardiology

## 2022-11-20 NOTE — Telephone Encounter (Signed)
Patient was calling back, bout her results. Please advise

## 2022-11-20 NOTE — Telephone Encounter (Signed)
Patient states she seen results via my chart and does not have questions at the moment.

## 2022-11-27 ENCOUNTER — Encounter: Payer: Self-pay | Admitting: Cardiology

## 2022-11-27 ENCOUNTER — Ambulatory Visit: Payer: Medicare Other | Attending: Cardiology | Admitting: Cardiology

## 2022-11-27 VITALS — BP 155/64 | HR 67 | Ht 68.0 in | Wt 165.8 lb

## 2022-11-27 DIAGNOSIS — I2119 ST elevation (STEMI) myocardial infarction involving other coronary artery of inferior wall: Secondary | ICD-10-CM | POA: Diagnosis not present

## 2022-11-27 DIAGNOSIS — Z9861 Coronary angioplasty status: Secondary | ICD-10-CM

## 2022-11-27 DIAGNOSIS — R002 Palpitations: Secondary | ICD-10-CM

## 2022-11-27 DIAGNOSIS — I771 Stricture of artery: Secondary | ICD-10-CM

## 2022-11-27 DIAGNOSIS — I25111 Atherosclerotic heart disease of native coronary artery with angina pectoris with documented spasm: Secondary | ICD-10-CM

## 2022-11-27 DIAGNOSIS — E785 Hyperlipidemia, unspecified: Secondary | ICD-10-CM

## 2022-11-27 DIAGNOSIS — I351 Nonrheumatic aortic (valve) insufficiency: Secondary | ICD-10-CM | POA: Diagnosis not present

## 2022-11-27 DIAGNOSIS — I10 Essential (primary) hypertension: Secondary | ICD-10-CM

## 2022-11-27 DIAGNOSIS — I2510 Atherosclerotic heart disease of native coronary artery without angina pectoris: Secondary | ICD-10-CM | POA: Diagnosis not present

## 2022-11-27 DIAGNOSIS — I358 Other nonrheumatic aortic valve disorders: Secondary | ICD-10-CM

## 2022-11-27 DIAGNOSIS — R0989 Other specified symptoms and signs involving the circulatory and respiratory systems: Secondary | ICD-10-CM

## 2022-11-27 NOTE — Patient Instructions (Addendum)
Medication Instructions:   No changes   *If you need a refill on your cardiac medications before your next appointment, please call your pharmacy*   Lab Work:fasting  Lipids CMP If you have labs (blood work) drawn today and your tests are completely normal, you will receive your results only by: MyChart Message (if you have MyChart) OR A paper copy in the mail If you have any lab test that is abnormal or we need to change your treatment, we will call you to review the results.   Testing/Procedures:  Will schedule at [address] Your physician has requested that you have a carotid duplex. This test is an ultrasound of the carotid arteries in your neck. It looks at blood flow through these arteries that supply the brain with blood. Allow one hour for this exam. There are no restrictions or special instructions.   In April 2025 , annual follow up at [address] Your physician has requested that you have an echocardiogram. Echocardiography is a painless test that uses sound waves to create images of your heart. It provides your doctor with information about the size and shape of your heart and how well your heart's chambers and valves are working. This procedure takes approximately one hour. There are no restrictions for this procedure. Please do NOT wear cologne, perfume, aftershave, or lotions (deodorant is allowed). Please arrive 15 minutes prior to your appointment time.    Follow-Up: At [facility], you and your health needs are our priority.  As part of our continuing mission to provide you with exceptional heart care, we have created designated Provider Care Teams.  These Care Teams include your primary Cardiologist (physician) and Advanced Practice Providers (APPs -  Physician Assistants and Nurse Practitioners) who all work together to provide you with the care you need, when you need it.     Your next appointment:   12 month(s)  The  format for your next appointment:   In Person  Provider:   [name], MD  Your physician wants you to follow-up  with a nurse visit  2 to 3 weeks    Other Instructions   continue with  monitoring  blood pressures- blood pressure log  - will need  blood pressure checked by  [place] at nurse visit -  the Nurse will also make a copy of  blood pressure log for Dr Herbie Baltimore to review

## 2022-11-27 NOTE — Progress Notes (Unsigned)
Primary Care Provider: Darrow Bussing, MD [facility] Cardiologist: [name], MD Electrophysiologist: None  Clinic Note: No chief complaint on file.   ===================================  ASSESSMENT/PLAN   Problem List Items Addressed This Visit   None   ===================================  HPI:    Laura Herrera is a 72 y.o. female with a PMH below who presents today for 1-month follow-up at the request of Koirala, Dibas, MD.  PMH  CAD-PCI (inferior STEMI, DES PCI to RCA x2-June 2016):  Existing LAD lesion - FFR negative in 2017.  Medical management.  No active angina HTN,  HLD,  PAD -> stenosis of isolated artery. Moderate Aortic Valve Regurgitation-most recent echo reviewed below. Alpha-Gal syndrome  ELISEA KHADER was last seen on ***  Recent Hospitalizations: ***  Reviewed  CV studies:    The following studies were reviewed today: (if available, images/films reviewed: From Epic Chart or Care Everywhere) TTE: EF 60 to 65%.  No RWMA AOV sclerosis with no stenosis.  Mean gradient 6.5.  Severe AI.  Normal RV size and function.  Normal PAP.  Interval History:   Laura Herrera   CV Review of Symptoms (Summary): Cardiovascular ROS: {[id number]}  REVIEWED OF SYSTEMS   ROS  I have reviewed and (if needed) personally updated the patient's problem list, medications, allergies, past medical and surgical history, social and family history.   PAST MEDICAL HISTORY   Past Medical History:  Diagnosis Date   Aortic valve insufficiency 06/2021   Echo read as normal EF, normal wall motions.  Severe MAC.  Moderate to severe AI.=> On review with imaging cardiologist, this appears to be an over estimation.  More consistent with mild to moderate AI.   CAD S/P percutaneous coronary angioplasty 01/2015   a. 01/2015 Inf STEMI/PCI: ostRCA 80% (Promus DES 2.75x12 - 3.1 mm), 100% & 70% p-mRCA covered with 1 DES (Promus DES 2.5x24  - 2.8 mm ), dRCA 90%(PTCA)  -10% ;  b. 01/2015 Relook: LM nl, mLAD 20%, LCX nl- L->R collats, Thrombus in p-mRCA stent->Aggrastat;  c. 01/2015 Echo: EF 60-65%, no RWMA, mod AI, PAP . d. 11/2015: CATH: patent stents, FFR neg LM-LAD   Chest pain with high risk for cardiac etiology 11/19/2015   Cardiac catheterization showed stable coronaries with patent RCA stents. FFR of LAD negative. --> LV gram EF suggested Takotsubo versus LAD spasm.   Complication of anesthesia    pseudo cholinesterace deficiency   Depression    Dyslipidemia    Generalized anxiety disorder    Currently being treated only with PRN Xanax for panic attacks.   Headache    "used to have them daily; I no longer do" (11/09/2015)   Hypertension    Insomnia    Kidney stones    "years ago; passed it"   MRSA infection    h/o MRSA infection in the right foot   NSTEMI (non-ST elevated myocardial infarction) 11/09/2015   No obvious culprit lesion found.  Thought to be potentially Takotsubo.   PONV (postoperative nausea and vomiting)    Pseudocholinesterase deficiency    Psoriasis    hand and foot (11/09/2015)   ST elevation myocardial infarction (STEMI) of inferior wall 01/2015   Severe RCA disease.  Ostial to proximal.  Single stent with PTCA of distal RCA and the PDA.Marland Kitchen    PAST SURGICAL HISTORY   Past Surgical History:  Procedure Laterality Date   ANGIOPLASTY  01/12/2015   Procedure: Angioplasty;  Surgeon: Lyn Records, MD;  Location:  [facility] INVASIVE CV LAB;  Service: Cardiovascular;; Unable to wire RCA -- treated plaque protrusion in pRCA stent with Aggrastat infusion.   APPENDECTOMY     BREAST BIOPSY     CARDIAC CATHETERIZATION N/A 01/11/2015   Procedure: Left Heart Cath and Coronary Angiography;  Surgeon: Marykay Lex, MD;  Location: [facility] INVASIVE CV LAB;  Service: Cardiovascular;  ost RCA 80% (calcified), pRCA 100% (followed by 70% tandem lesion), dRCA 90%.   Mild to moderate disease in LAD and circumflex   CARDIAC CATHETERIZATION  01/11/2015    Procedure: Coronary Stent Intervention;  Surgeon: Marykay Lex, MD;  Location: [facility] INVASIVE CV LAB;  Service: Cardiovascular;; ost RCA 80%- cutting balloon PTCA-> DES PCI (2.75 x 12 Promus DES - 3.1 mm); pRCA (covering tandem 100% -7-%) DES PCI (Promus DES 2.5 x 24 --> 2.8 mm)   CARDIAC CATHETERIZATION  01/11/2015   Procedure: Coronary Balloon Angioplasty;  Surgeon: Marykay Lex, MD;  Location: [facility] INVASIVE CV LAB;  Service: Cardiovascular;;PTCA of dRCA 90% --> 10%.   CARDIAC CATHETERIZATION N/A 01/12/2015   Procedure: Left Heart Cath and Coronary Angiography;  Surgeon: Lyn Records, MD;  Location: [facility] INVASIVE CV LAB;  Service: Cardiovascular;; Relook Cath for recurrent CP post STEMI PCI --> difficult to engage ostial RCA stent (unable to wire) - plaque protrusion in pRCA stent --> Rx with Aggrastat infusion.   CARDIAC CATHETERIZATION N/A 11/10/2015   Procedure: Left Heart Cath and Coronary Angiography;  Surgeon: Lennette Bihari, MD;  Location: [facility] INVASIVE CV LAB;  Service: Cardiovascular; patent RCA stents. Ostial LM 30%, ostial LAD 40% - FFR negative. Moderate-severe LV dysfunction with hypo-kinesis involving the anterior wall to the apex. EF suggested 30%. (? Takotsubo)   CARDIAC CATHETERIZATION N/A 11/10/2015   Procedure: Intravascular Pressure Wire/FFR Study;  Surgeon: Lennette Bihari, MD;  Location: [facility] INVASIVE CV LAB;  Service: Cardiovascular;  FFR of LAD negative.   DILATION AND CURETTAGE OF UTERUS  1979   S/P miscarriage   INCISION AND DRAINAGE FOOT Right    "spider bite"   NOSE SURGERY  X 2   "to remove genetic bone spur"   TRANSTHORACIC ECHOCARDIOGRAM  11/11/2015   Normal LV size and function. Vigorous function with EF 6570%. GR 1 DD. Mild-moderate MR.   TRANSTHORACIC ECHOCARDIOGRAM  06/13/2021   EF 60 to 65%.  No R WMA.  Unable to assess EF.  Normal RV size function.  Normal RV pressures.  Severe MAC with no MR or MS. "  Moderate-severe" AI - overread as Mild-Moderate (Dr. Royann Shivers).    MEDICATIONS/ALLERGIES   Current Meds  Medication Sig   acetaminophen (TYLENOL) 500 MG tablet Take 500 mg by mouth every 6 (six) hours as needed for headache.   ALPRAZolam (XANAX) 0.25 MG tablet TAKE 1 TABLET TWICE A DAY AS NEEDED FOR ANXIETY   amLODipine (NORVASC) 5 MG tablet TAKE 1 TABLET(5 MG) BY MOUTH DAILY   aspirin EC 81 MG tablet Take 1 tablet (81 mg total) by mouth daily.   cholecalciferol (VITAMIN D) 1000 UNITS tablet Take 1,000 Units by mouth daily.   EPINEPHrine 0.3 mg/0.3 mL IJ SOAJ injection    furosemide (LASIX) 20 MG tablet Take 1 tablet by mouth as needed.   hydrOXYzine (ATARAX) 10 MG tablet Take 10 mg by mouth at bedtime.   metoprolol succinate (TOPROL-XL) 25 MG 24 hr tablet Take half tablet by mouth every day.   nitroGLYCERIN (NITROSTAT) 0.4 MG SL tablet Place 1 tablet (0.4 mg total) under  the tongue every 5 (five) minutes x 3 doses as needed for chest pain.   pantoprazole (PROTONIX) 40 MG tablet TAKE 1 TABLET BY MOUTH DAILY AT 12 NOON.   rosuvastatin (CRESTOR) 40 MG tablet Take 1 tablet (40 mg total) by mouth daily.   triamcinolone cream (KENALOG) 0.1 % as needed.   valACYclovir (VALTREX) 500 MG tablet Take 500 mg by mouth 2 (two) times daily.    Allergies  Allergen Reactions   Cholinesterase Inhibitors Other (See Comments)    Enzyme deficiency which causes muscle paralyses after receiving anesthesia.   Other Other (See Comments) and Rash    Sensitive to the electrodes Tears skin off, and "paper" tape also   Adhesive [Tape] Other (See Comments)    Tears skin off, and "paper" tape also   Shellfish-Derived Products Swelling    Crabs, lobster, crayfish; tissues in joints swelled   Epinephrine Palpitations    Palpitations and involuntary body jerking   Vancomycin Hives and Rash    severe     SOCIAL HISTORY/FAMILY HISTORY   Reviewed in Epic:  Pertinent findings:  Social History   Tobacco Use   Smoking status: Former    Packs/day: 0.10    Years: 2.00     Additional pack years: 0.00    Total pack years: 0.20    Types: Cigarettes   Smokeless tobacco: Never   Tobacco comments:    "quit smoking in the 1980s"  Substance Use Topics   Alcohol use: Yes    Alcohol/week: 2.0 standard drinks of alcohol    Types: 2 Glasses of wine per week   Drug use: No   Social History   Social History Narrative   She is under a significant amount of stress socially.    Her husband committed suicide several years ago - she continues to have difficulty with coping.    OBJCTIVE -PE, EKG, labs   Wt Readings from Last 3 Encounters:  11/27/22 165 lb 12.8 oz (75.2 kg)  06/06/22 165 lb (74.8 kg)  10/14/21 161 lb (73 kg)    Physical Exam: BP (!) 155/64 (BP Location: Left Arm, Patient Position: Sitting, Cuff Size: Normal)   Pulse 67   Ht  (1.727 m)   Wt 165 lb 12.8 oz (75.2 kg)   SpO2 100%   BMI 25.21 kg/m  Physical Exam     Adult ECG Report  N/a  Recent Labs:  due for labs 03/2022:   Lab Results  Component Value Date   CHOL 183 01/10/2017   HDL 74 01/10/2017   LDLCALC 82 01/10/2017   TRIG 133 01/10/2017   CHOLHDL 2.5 01/10/2017   Lab Results  Component Value Date   CREATININE 1.26 (H) 10/09/2021   BUN 18 10/09/2021   NA 139 10/09/2021   K 3.8 10/09/2021   CL 104 10/09/2021   CO2 24 10/09/2021      Latest Ref Rng & Units 10/09/2021    3:47 PM 03/05/2016    6:32 PM 11/18/2015   10:56 PM  CBC  WBC 4.0 - 10.5 K/uL 8.0  7.3  7.5   Hemoglobin 12.0 - 15.0 g/dL 78.2  95.6  21.3   Hematocrit 36.0 - 46.0 % 39.5  35.0  32.7   Platelets 150 - 400 K/uL 219  280  348     Lab Results  Component Value Date   HGBA1C 5.9 (H) 11/09/2015   Lab Results  Component Value Date   TSH 1.665 10/09/2021    ==================================================  I spent a total of ***minutes with the patient spent in direct patient consultation.  Additional time spent with chart review  / charting (studies, outside notes, etc): *** min Total Time:  *** min  Current medicines are reviewed at length with the patient today.  (+/- concerns) ***  Notice: This dictation was prepared with Dragon dictation along with smart phrase technology. Any transcriptional errors that result from this process are unintentional and may not be corrected upon review.  Studies Ordered:   No orders of the defined types were placed in this encounter.  No orders of the defined types were placed in this encounter.   Patient Instructions / Medication Changes & Studies & Tests Ordered   There are no Patient Instructions on file for this visit.     Marykay Lex, MD, MS [name], M.D., M.S. Interventional Cardiologist  [facility]  Pager # [phone] Phone # [phone] [address] [place], [place] [address]   Thank you for choosing  [facility]!!

## 2022-11-28 ENCOUNTER — Encounter: Payer: Self-pay | Admitting: Cardiology

## 2022-11-28 NOTE — Assessment & Plan Note (Signed)
Seem to be pretty well-controlled on the Toprol.  If she does start having some more spells in the morning I think we could potentially consider trying to titrate up to 25 mg Toprol full dose.

## 2022-11-28 NOTE — Assessment & Plan Note (Addendum)
Blood pressure not ideally controlled.  She is a little bit stressed but tells me that her pressures at home are usually in the 120-130/40-50 range.  Not unreasonable to have high pulse pressure with a.  Low threshold to titrate Norvasc to 10 mg but will be difficult to titrate Toprol further.  Would probably add ARB next. Have asked that she continue to monitor pressures at home and if she does start drifting into the higher range greater than 130s over 50s and 60s, I think we should probably be more aggressive with titrating meds.  She will have her blood pressure checked when she comes in for echo.

## 2022-11-28 NOTE — Assessment & Plan Note (Signed)
Did have some spasm back..  Initial PCI.  This is the reason why we chose calcium channel blocker over ARB.  Probably consider titrating that up before adding an ARB if we need to control.

## 2022-11-28 NOTE — Assessment & Plan Note (Signed)
Almost 8 years out from her MI.  She had stents placed in the RCA that were patent 1 year later when she had a heart cath in April 2017.  She does not seem to be having any true anginal symptoms.  Just some fleeting episodes of heart racing or twinges in her chest but nothing that is exertional or concerning.  Plan: Continue low-dose Toprol-leery of titrating up based on resting heart rate in the 60s.  If we do need more blood pressure room could consider converting to carvedilol. Continue amlodipine currently at 5 mg daily for blood pressure and antianginal benefit-this would probably be the first up titration for additional blood pressure control. Switch from Plavix to aspirin monotherapy for maintenance.  No bleeding issues. Okay to hold aspirin 5-7 days for surgeries or procedures.

## 2022-11-28 NOTE — Assessment & Plan Note (Signed)
Clearly at least moderate to severe AI.  Continue annual follow-up.  Echo prior to 1 year follow-up.  Thankfully, she is pretty asymptomatic and the left ventricle size and function is still stable and normal.  At this point we talked about concerning symptoms of heart failure/dyspnea etc. that would potentially indicate symptomatic aortic regurgitation.  Continue to treat blood pressure/afterload reduction.  Interesting at home she says her blood pressure in the 120s to 130s over 40s to 50s whereas here in the 1 pressures quite high similar on follow-up.    When asked that she continue to monitor pressures, low threshold to titrate up amlodipine or potentially add ARB.

## 2022-11-28 NOTE — Assessment & Plan Note (Signed)
Carotid bruit heard on exam.  Will have her come in for carotid Doppler scan.  I think it is probably just radiated aortic murmur.  When she comes in for her Doppler she will get her blood pressure checked.

## 2022-11-28 NOTE — Assessment & Plan Note (Signed)
With converted to Crestor 40 mg from Lipitor when I saw her last time.  Unfortunately, her labs were not checked at 3 months.  We will go ahead and order lipid panel now.  Would like to see the LDL down below 55 if possible but at least below 70.  If not at goal, we will probably would want to consider adding Nexlizet and then consider the possibility of PCSK9 inhibitor or inclisiran.

## 2022-11-28 NOTE — Assessment & Plan Note (Signed)
Almost 8 years out from inferior MI with RCA PCI.  EF had a drop back in 2017 for what [place] toxic or cardiomyopathy but now fully recovered to a normal EF of 60 to 65%.  No active angina or heart failure symptoms.

## 2022-12-04 ENCOUNTER — Ambulatory Visit
Admission: RE | Admit: 2022-12-04 | Discharge: 2022-12-04 | Disposition: A | Discharge status: Home / Self Care | Payer: Medicare Other | Source: Ambulatory Visit | Attending: Family Medicine | Admitting: Family Medicine

## 2022-12-04 DIAGNOSIS — Z1231 Encounter for screening mammogram for malignant neoplasm of breast: Secondary | ICD-10-CM

## 2022-12-11 DIAGNOSIS — E785 Hyperlipidemia, unspecified: Secondary | ICD-10-CM | POA: Diagnosis not present

## 2022-12-11 DIAGNOSIS — I2119 ST elevation (STEMI) myocardial infarction involving other coronary artery of inferior wall: Secondary | ICD-10-CM | POA: Diagnosis not present

## 2022-12-11 DIAGNOSIS — R002 Palpitations: Secondary | ICD-10-CM | POA: Diagnosis not present

## 2022-12-12 LAB — LIPID PANEL
Chol/HDL Ratio: 2.1 ratio (ref 0.0–4.4)
Cholesterol, Total: 183 mg/dL (ref 100–199)
HDL: 88 mg/dL (ref >39)
LDL Chol Calc (NIH): 72 mg/dL (ref 0–99)
Triglycerides: 136 mg/dL (ref 0–149)
VLDL Cholesterol Cal: 23 mg/dL (ref 5–40)

## 2022-12-12 LAB — COMPREHENSIVE METABOLIC PANEL
ALT: 62 IU/L — ABNORMAL HIGH (ref 0–32)
AST: 56 IU/L — ABNORMAL HIGH (ref 0–40)
Albumin/Globulin Ratio: 2 (ref 1.2–2.2)
Albumin: 4.8 g/dL (ref 3.8–4.8)
Alkaline Phosphatase: 87 IU/L (ref 44–121)
BUN/Creatinine Ratio: 17 (ref 12–28)
BUN: 21 mg/dL (ref 8–27)
Bilirubin Total: 0.4 mg/dL (ref 0.0–1.2)
CO2: 23 mmol/L (ref 20–29)
Calcium: 10.3 mg/dL (ref 8.7–10.3)
Chloride: 100 mmol/L (ref 96–106)
Creatinine, Ser: 1.21 mg/dL — ABNORMAL HIGH (ref 0.57–1.00)
Globulin, Total: 2.4 g/dL (ref 1.5–4.5)
Glucose: 97 mg/dL (ref 70–99)
Potassium: 4.7 mmol/L (ref 3.5–5.2)
Sodium: 138 mmol/L (ref 134–144)
Total Protein: 7.2 g/dL (ref 6.0–8.5)
eGFR: 48 mL/min/{1.73_m2} — ABNORMAL LOW (ref >59)

## 2022-12-21 ENCOUNTER — Encounter: Payer: Self-pay | Admitting: *Deleted

## 2022-12-21 ENCOUNTER — Ambulatory Visit
Admission: RE | Admit: 2022-12-21 | Discharge: 2022-12-21 | Disposition: A | Discharge status: Home / Self Care | Payer: Medicare Other | Source: Ambulatory Visit | Attending: Internal Medicine | Admitting: Internal Medicine

## 2022-12-21 ENCOUNTER — Ambulatory Visit (INDEPENDENT_AMBULATORY_CARE_PROVIDER_SITE_OTHER): Payer: Medicare Other | Admitting: *Deleted

## 2022-12-21 DIAGNOSIS — I2510 Atherosclerotic heart disease of native coronary artery without angina pectoris: Secondary | ICD-10-CM | POA: Diagnosis not present

## 2022-12-21 DIAGNOSIS — I25111 Atherosclerotic heart disease of native coronary artery with angina pectoris with documented spasm: Secondary | ICD-10-CM

## 2022-12-21 DIAGNOSIS — I2119 ST elevation (STEMI) myocardial infarction involving other coronary artery of inferior wall: Secondary | ICD-10-CM | POA: Insufficient documentation

## 2022-12-21 DIAGNOSIS — I771 Stricture of artery: Secondary | ICD-10-CM | POA: Diagnosis not present

## 2022-12-21 DIAGNOSIS — I10 Essential (primary) hypertension: Secondary | ICD-10-CM | POA: Diagnosis not present

## 2022-12-21 DIAGNOSIS — Z9861 Coronary angioplasty status: Secondary | ICD-10-CM | POA: Diagnosis not present

## 2022-12-21 DIAGNOSIS — Z5181 Encounter for therapeutic drug level monitoring: Secondary | ICD-10-CM | POA: Diagnosis not present

## 2022-12-21 NOTE — Progress Notes (Addendum)
   Nurse Visit   Date of Encounter: 12/21/2022 ID: Laura Herrera, DOB 01-Jan-1951, MRN [id number]  PCP:  Darrow Bussing, MD   [facility] Providers Cardiologist:  [name], MD      Visit Details   VS:  Pulse 80   Ht 5\' 8"  (1.727 m)   Wt 165 lb 3.2 oz (74.9 kg)   BMI 25.12 kg/m  , BMI Body mass index is 25.12 kg/m.  Wt Readings from Last 3 Encounters:  12/21/22 165 lb 3.2 oz (74.9 kg)  11/27/22 165 lb 12.8 oz (75.2 kg)  06/06/22 165 lb (74.8 kg)     Reason for visit: blood pressure check per las visit 11/28/22 Performed today: Vitals    right   arm 180/80  patient  did  rest at 5 minutes prior to reading . She states she is coming from seeing her sister  at a nursing home as well as doing some errands. Patient is also schedule to have  carotid dopplers  today. The technician will recheck pressure in both arms - blood pressures were rechecked right  134/62 left 139/62 , Provider not consulted.  RN will send office note to Dr Herbie Baltimore to review:  patient brought her blood pressure reading for Dr Herbie Baltimore to review. Changes (medications, testing, etc.) : - no changes  Length of Visit: 35 minutes    Medications Adjustments/Labs and Tests Ordered:   Looks like her blood pressures get up pretty high.  I think we are okay to go ahead and titrate up to 10 mg of amlodipine.     [name], MD  12/21/22    Signed, Tobin Chad, RN  12/21/2022 3:46 PM

## 2022-12-21 NOTE — Progress Notes (Signed)
Looks like her blood pressures get up pretty high.  I think we are okay to go ahead and titrate up to 10 mg of amlodipine.   [name], MD

## 2022-12-22 ENCOUNTER — Telehealth: Payer: Self-pay | Admitting: *Deleted

## 2022-12-22 MED ORDER — AMLODIPINE BESYLATE 10 MG PO TABS: 10.0000 mg | ORAL_TABLET | Freq: Every day | ORAL | 3 refills | Status: DC

## 2022-12-22 NOTE — Telephone Encounter (Signed)
  Looks like her blood pressures get up pretty high.  I think we are okay to go ahead and titrate up to 10 mg of amlodipine.     [name], MD    The above was Dr Herbie Baltimore response to patient's blood pressure check 12/21/22.  Patient is aware  New prescription was sent the pharmacy.

## 2022-12-28 NOTE — Telephone Encounter (Signed)
Pt c/o medication issue:  1. Name of Medication:   amLODipine (NORVASC) 10 MG tablet   2. How are you currently taking this medication (dosage and times per day)?  As prescribed  3. Are you having a reaction (difficulty breathing--STAT)?   Patient states she has been having more frequent heart beats  4. What is your medication issue?   Patient stated she has been taking double tablets for about a week now.  Patient stated she has also been feeling dizzy when she stands up but has not blacked out.  Patient states she was dreaming 2 mornings ago (Tuesday, 5/21) and felt like she was having chest pains and when she woke up she did not have this symptom so is unsure if it was a dream or not.

## 2022-12-28 NOTE — Telephone Encounter (Signed)
Called pt states she has had 2 dizzy spells since starting the increased dose of Amlodipine.  21st 135/52 HR 54 22nd 132/50 HR 55 Last night 105/38 HR 51 She states she feels good today. "I was wondering should I just get use to this increased dose or should we decrease it? It's only been a week."

## 2022-12-28 NOTE — Telephone Encounter (Signed)
Pt called back, she states she was thinking about our conversation . She wanted to tell me she is having palpations randomly. "I do not usually have them but I have noticed them since I've doubled the medication."

## 2022-12-29 NOTE — Telephone Encounter (Signed)
Left message - on voicemail per DPR, information per Dr Herbie Baltimore was recited to voicemail . Any question may call back

## 2022-12-29 NOTE — Telephone Encounter (Signed)
Rebound tachycardia is a known side effect of Norvasc -- could be this -- related to peripheral vasodilation reducing venous return to the heart. Sort of similar to the reason why people also see more swelling.  Usually this is a benign symptom & hopefuly will get better.   Recommend increasing hydration as 1st option  [facility]

## 2023-01-18 DIAGNOSIS — I8312 Varicose veins of left lower extremity with inflammation: Secondary | ICD-10-CM | POA: Diagnosis not present

## 2023-01-18 DIAGNOSIS — I872 Venous insufficiency (chronic) (peripheral): Secondary | ICD-10-CM | POA: Diagnosis not present

## 2023-01-18 DIAGNOSIS — I8311 Varicose veins of right lower extremity with inflammation: Secondary | ICD-10-CM | POA: Diagnosis not present

## 2023-01-18 DIAGNOSIS — L209 Atopic dermatitis, unspecified: Secondary | ICD-10-CM | POA: Diagnosis not present

## 2023-01-30 ENCOUNTER — Telehealth: Payer: Self-pay | Admitting: Cardiology

## 2023-01-30 NOTE — Telephone Encounter (Signed)
Patient c/o Palpitations:  High priority if patient c/o lightheadedness, shortness of breath, or chest pain  How long have you had palpitations/irregular HR/ Afib? Are you having the symptoms now?   No  Are you currently experiencing lightheadedness, SOB or CP?   No  Do you have a history of afib (atrial fibrillation) or irregular heart rhythm?   Have you checked your BP or HR? (document readings if available):   129/56  HR 67 (today)  Are you experiencing any other symptoms?   Really tired   Patient went to an outdoor concert on Sunday and had developed a quick heart.  She also had 3 short spells of racing heart yesterday.  Patient stated her BP has been a little elevated yesterday but back to normal today.

## 2023-01-30 NOTE — Telephone Encounter (Signed)
Patient stated she went to a outside concert Saturday. and while outside had an episode of increased heart rate and shortness of breath so she went home. Yesterday she had like 3 episodes where her heart rate was increased. Today her blood pressure is 129/56 with hr-67. Currently patient is asymptomatic. Stated she did feel fatigued yesterday as well.   Advised to continue to monitor heart rate and blood pressure, staying hydrated and continue taking medications as prescribed. Discussed ED precautions. Will forward to provider for further recommendations.

## 2023-01-31 NOTE — Telephone Encounter (Signed)
Most likely related to heat & dehydration --> would increase PO hydration (including rehydration drinks - liquid IV, Propel, Gatorade Zero) - try to get to 80 oz a day.   Continue to monitor -- if spells persist -we can order a monitor to evaluate.  DH

## 2023-02-01 ENCOUNTER — Telehealth: Payer: Self-pay | Admitting: Cardiology

## 2023-02-01 DIAGNOSIS — I872 Venous insufficiency (chronic) (peripheral): Secondary | ICD-10-CM | POA: Diagnosis not present

## 2023-02-01 DIAGNOSIS — I8312 Varicose veins of left lower extremity with inflammation: Secondary | ICD-10-CM | POA: Diagnosis not present

## 2023-02-01 DIAGNOSIS — L209 Atopic dermatitis, unspecified: Secondary | ICD-10-CM | POA: Diagnosis not present

## 2023-02-01 DIAGNOSIS — I8311 Varicose veins of right lower extremity with inflammation: Secondary | ICD-10-CM | POA: Diagnosis not present

## 2023-02-01 MED ORDER — FUROSEMIDE 20 MG PO TABS: 20.0000 mg | ORAL_TABLET | ORAL | 3 refills | Status: AC | PRN

## 2023-02-01 NOTE — Telephone Encounter (Signed)
Patient states swelling to Right leg for approx 2-3 days.  Red and "looks like when I get cellulitis".  She states usually she gets chills and nausea before that happens, but this time it just went to red. Dermatology gave cephalexin 500 mg BID.  Dermatology ask if she should be taking a diuretic daily. She has lasix on file but  has not taken the Lasix in 2-3 weeks.  New RX was sent today as she is not sure when refilled last and our records show not sent since 2022.  She will wear compression stockings and elevation, and watching sodium.   Advised to take daily weights , she did have weight today which stated down 4 lbs.  Advised weight gain 3 lbs in a day or 5 lbs in a week to call office.  Take Lasix daily to see if this helps with swelling.  She will call Monday if no improvement.  If any worsening symptoms and fever and chills while on ATB and with lasix, she will go to ED for evaluation if outside our our hours

## 2023-02-01 NOTE — Telephone Encounter (Signed)
If it looks like cellulitis, probably would be best to see you at least the PCP.

## 2023-02-01 NOTE — Telephone Encounter (Signed)
Pt c/o swelling: STAT is pt has developed SOB within 24 hours  If swelling, where is the swelling located? Right leg   How much weight have you gained and in what time span? Lost weight  Have you gained 3 pounds in a day or 5 pounds in a week? No  Do you have a log of your daily weights (if so, list)? No  Are you currently taking a fluid pill? No  Are you currently SOB? No  Have you traveled recently? No  Pt is taking double of amLODipine which she thinks is causing swelling. She states that dermologist asked if she should take diuretic everyday. Please advise.

## 2023-02-01 NOTE — Telephone Encounter (Signed)
Discussed recommendations.  She isn't sure she needs to come in as does not feel cellulitis, but also explained if it is edema then she should still be evaluated.  She then agrees and scheduled for tomorrow with NP

## 2023-02-01 NOTE — Telephone Encounter (Signed)
Patient aware of provider recommendations 

## 2023-02-02 ENCOUNTER — Encounter: Payer: Self-pay | Admitting: General Practice

## 2023-02-02 ENCOUNTER — Ambulatory Visit: Payer: Medicare Other | Attending: General Practice | Admitting: General Practice

## 2023-02-02 VITALS — BP 124/64 | HR 72 | Ht 68.0 in | Wt 160.2 lb

## 2023-02-02 DIAGNOSIS — E785 Hyperlipidemia, unspecified: Secondary | ICD-10-CM | POA: Diagnosis not present

## 2023-02-02 DIAGNOSIS — I25111 Atherosclerotic heart disease of native coronary artery with angina pectoris with documented spasm: Secondary | ICD-10-CM | POA: Diagnosis not present

## 2023-02-02 DIAGNOSIS — I10 Essential (primary) hypertension: Secondary | ICD-10-CM

## 2023-02-02 DIAGNOSIS — R600 Localized edema: Secondary | ICD-10-CM | POA: Diagnosis not present

## 2023-02-02 MED ORDER — CEPHALEXIN 500 MG PO CAPS
500.0000 mg | ORAL_CAPSULE | Freq: Two times a day (BID) | ORAL | 0 refills | Status: DC
Start: 2023-02-02 — End: 2024-04-18

## 2023-02-02 NOTE — Patient Instructions (Signed)
Medication Instructions:  TAKE 2 ADDITIONAL DAYS OF CEPHALEXIN 500MG  TWICE DAILY-IF THE CELLULITIS IS NOT GONE CALL us FOR FURTHER INSTRUCTION *If you need a refill on your cardiac medications before your next appointment, please call your pharmacy*  Lab Work: NONE If you have labs (blood work) drawn today and your tests are completely normal, you will receive your results only by:  MyChart Message (if you have MyChart) OR  A paper copy in the mail If you have any lab test that is abnormal or we need to change your treatment, we will call you to review the results.  Testing/Procedures: NONE  Follow-Up: At [facility], you and your health needs are our priority.  As part of our continuing mission to provide you with exceptional heart care, we have created designated Provider Care Teams.  These Care Teams include your primary Cardiologist (physician) and Advanced Practice Providers (APPs -  Physician Assistants and Nurse Practitioners) who all work together to provide you with the care you need, when you need it.  Your next appointment:   4-6 month(s)  Provider:   Edd Fabian, FNP or [name], DNP, ANP    Other Instructions

## 2023-02-02 NOTE — Progress Notes (Signed)
Cardiology Clinic Note   Patient Name: Laura Herrera Date of Encounter: 02/02/2023  Primary Care Provider:  Darrow Bussing, MD Primary Cardiologist:  [name], MD  Patient Profile    Laura Herrera 72 year old female presents to the clinic today for an evaluation of her right leg edema.  Past Medical History    Past Medical History:  Diagnosis Date   Aortic valve insufficiency 06/2021   Echo read as normal EF, normal wall motions.  Severe MAC.  Moderate to severe AI.=> On review with imaging cardiologist, this appears to be an over estimation.  More consistent with mild to moderate AI.   CAD S/P percutaneous coronary angioplasty 01/2015   a. 01/2015 Inf STEMI/PCI: ostRCA 80% (Promus DES 2.75x12 - 3.1 mm), 100% & 70% p-mRCA covered with 1 DES (Promus DES 2.5x24  - 2.8 mm ), dRCA 90%(PTCA) -10% ;  b. 01/2015 Relook: LM nl, mLAD 20%, LCX nl- L->R collats, Thrombus in p-mRCA stent->Aggrastat;  c. 01/2015 Echo: EF 60-65%, no RWMA, mod AI, PAP . d. 11/2015: CATH: patent stents, FFR neg LM-LAD   Chest pain with high risk for cardiac etiology 11/19/2015   Cardiac catheterization showed stable coronaries with patent RCA stents. FFR of LAD negative. --> LV gram EF suggested Takotsubo versus LAD spasm.   Complication of anesthesia    pseudo cholinesterace deficiency   Depression    Dyslipidemia    Generalized anxiety disorder    Currently being treated only with PRN Xanax for panic attacks.   Headache    "used to have them daily; I no longer do" (11/09/2015)   Hypertension    Insomnia    Kidney stones    "years ago; passed it"   MRSA infection    h/o MRSA infection in the right foot   NSTEMI (non-ST elevated myocardial infarction) (HCC) 11/09/2015   No obvious culprit lesion found.  Thought to be potentially Takotsubo.   PONV (postoperative nausea and vomiting)    Pseudocholinesterase deficiency    Psoriasis    hand and foot (11/09/2015)   ST elevation myocardial  infarction (STEMI) of inferior wall (HCC) 01/2015   Severe RCA disease.  Ostial to proximal.  Single stent with PTCA of distal RCA and the PDA.Marland Kitchen   Past Surgical History:  Procedure Laterality Date   ANGIOPLASTY  01/12/2015   Procedure: Angioplasty;  Surgeon: Lyn Records, MD;  Location: [facility] INVASIVE CV LAB;  Service: Cardiovascular;; Unable to wire RCA -- treated plaque protrusion in pRCA stent with Aggrastat infusion.   APPENDECTOMY     BREAST BIOPSY     CARDIAC CATHETERIZATION N/A 01/11/2015   Procedure: Left Heart Cath and Coronary Angiography;  Surgeon: Marykay Lex, MD;  Location: [facility] INVASIVE CV LAB;  Service: Cardiovascular;  ost RCA 80% (calcified), pRCA 100% (followed by 70% tandem lesion), dRCA 90%.   Mild to moderate disease in LAD and circumflex   CARDIAC CATHETERIZATION  01/11/2015   Procedure: Coronary Stent Intervention;  Surgeon: Marykay Lex, MD;  Location: [facility] INVASIVE CV LAB;  Service: Cardiovascular;; ost RCA 80%- cutting balloon PTCA-> DES PCI (2.75 x 12 Promus DES - 3.1 mm); pRCA (covering tandem 100% -7-%) DES PCI (Promus DES 2.5 x 24 --> 2.8 mm)   CARDIAC CATHETERIZATION  01/11/2015   Procedure: Coronary Balloon Angioplasty;  Surgeon: Marykay Lex, MD;  Location: [facility] INVASIVE CV LAB;  Service: Cardiovascular;;PTCA of dRCA 90% --> 10%.   CARDIAC CATHETERIZATION N/A 01/12/2015   Procedure: Left  Heart Cath and Coronary Angiography;  Surgeon: Lyn Records, MD;  Location: [facility] INVASIVE CV LAB;  Service: Cardiovascular;; Relook Cath for recurrent CP post STEMI PCI --> difficult to engage ostial RCA stent (unable to wire) - plaque protrusion in pRCA stent --> Rx with Aggrastat infusion.   CARDIAC CATHETERIZATION N/A 11/10/2015   Procedure: Left Heart Cath and Coronary Angiography;  Surgeon: Lennette Bihari, MD;  Location: [facility] INVASIVE CV LAB;  Service: Cardiovascular; patent RCA stents. Ostial LM 30%, ostial LAD 40% - FFR negative. Moderate-severe LV dysfunction with  hypo-kinesis involving the anterior wall to the apex. EF suggested 30%. (? Takotsubo)   CARDIAC CATHETERIZATION N/A 11/10/2015   Procedure: Intravascular Pressure Wire/FFR Study;  Surgeon: Lennette Bihari, MD;  Location: [facility] INVASIVE CV LAB;  Service: Cardiovascular;  FFR of LAD negative.   DILATION AND CURETTAGE OF UTERUS  1979   S/P miscarriage   INCISION AND DRAINAGE FOOT Right    "spider bite"   NOSE SURGERY  X 2   "to remove genetic bone spur"   TRANSTHORACIC ECHOCARDIOGRAM  11/11/2015   Normal LV size and function. Vigorous function with EF 6570%. GR 1 DD. Mild-moderate MR.   TRANSTHORACIC ECHOCARDIOGRAM  11/09/2022   EF 60 to 65%.  No RWMA AOV sclerosis with no stenosis.  Mean gradient 6.5.  Severe AI.  Normal RV size and function.  Normal PAP. (Images reviewed with Dr. Royann Shivers.  He believes that the pressure half-time does not correlate with severe AI even though the images appear to be relatively severe.  Likely moderate to severe.  LV size is normal as is EF.)    Allergies  Allergies  Allergen Reactions   Cholinesterase Inhibitors Other (See Comments)    Enzyme deficiency which causes muscle paralyses after receiving anesthesia.   Other Other (See Comments) and Rash    Sensitive to the electrodes Tears skin off, and "paper" tape also   Adhesive [Tape] Other (See Comments)    Tears skin off, and "paper" tape also   Shellfish-Derived Products Swelling    Crabs, lobster, crayfish; tissues in joints swelled   Epinephrine Palpitations    Palpitations and involuntary body jerking   Vancomycin Hives and Rash    severe     History of Present Illness    Laura Herrera has a PMH of coronary artery disease, aortic valve insufficiency, essential hypertension, palpitations, hyperlipidemia, and stenosis of right subclavian artery.  She had inferior STEMI with DES to her RCA x 2 6/16.  She was noted to have Takotsubo cardiomyopathy for 517 and her EF was noted to be 30%.  She was  noted to have an existing LAD lesion FFR was negative in 2017.  Medical management was recommended.  Her EF returned to 60 to 65%.  Her PMH also includes alpha-gal syndrome.  She was seen in follow-up by Dr. Herbie Baltimore on 11/27/2022.  She was somewhat frustrated because her alpha gal antibodies were still high.  She remained overall stable from a cardiac standpoint.  She denied chest pain and pressure with rest or exertion.  She denies exertional dyspnea.  She reported that some days she would feel really tired in the morning but felt fine after she was able to get going well other days she was able to get up and go to the mountains or concert.  She was rarely taking Lasix.  She did have some dietary indiscretion.  Her blood pressures were not ideally controlled.  Plan was made to  increase amlodipine which was later increased.  Recommendation for ARB after was made.  She contacted nurse triage line yesterday and reported that she had been taking an increased amount of amlodipine.  She was experiencing right leg swelling.  Her dermatologist asked if she should be taking diuretic daily.  She presents to the clinic today for evaluation and states she is noticing improvement in her lower extremity redness.  She started taking cephalexin yesterday which was prescribed by her dermatologist.  She reports that she was prescribed 500 mg twice daily for 5 days.  Her lower extremity redness is unilateral and demarcation is midshin.  She reports that her lower extremity redness is improving.  Her left lower extremity is euvolemic.  She reports that she attempted to go to a [employer] concert in [place] last weekend.  She noted increased work of breathing with the humidity and temperature.  She was forced to leave the concert early.  With being inside and using air conditioning her breathing has significantly improved.  Her blood pressure is well-controlled at 124/64.  We reviewed her amlodipine.  I do not feel this is  contributing to any lower extremity edema.  I will continue her current medication regimen and give her another 2 days of cephalexin to complete 7-day course.  I instructed her to contact our office if she does not notice a resolution in her symptoms.  We will have her follow-up in 4 to 6 months with myself or Bailey Mech.  Today she denies chest pain, shortness of breath,  fatigue, palpitations, melena, hematuria, hemoptysis, diaphoresis, weakness, presyncope, syncope, orthopnea, and PND.    Home Medications    Prior to Admission medications   Medication Sig Start Date End Date Taking? Authorizing Provider  acetaminophen (TYLENOL) 500 MG tablet Take 500 mg by mouth every 6 (six) hours as needed for headache.    [provider]  ALPRAZolam (XANAX) 0.25 MG tablet TAKE 1 TABLET TWICE A DAY AS NEEDED FOR ANXIETY 11/12/15   [provider]  amLODipine (NORVASC) 10 MG tablet Take 1 tablet (10 mg total) by mouth daily. 12/22/22 03/22/23  Marykay Lex, MD  aspirin EC 81 MG tablet Take 1 tablet (81 mg total) by mouth daily. 10/30/19   Marykay Lex, MD  cholecalciferol (VITAMIN D) 1000 UNITS tablet Take 1,000 Units by mouth daily.    [provider]  EPINEPHrine 0.3 mg/0.3 mL IJ SOAJ injection  07/25/21   [provider]  furosemide (LASIX) 20 MG tablet Take 1 tablet (20 mg total) by mouth as needed. 02/01/23   Marykay Lex, MD  hydrOXYzine (ATARAX) 10 MG tablet Take 10 mg by mouth at bedtime. 11/17/22   [provider]  metoprolol succinate (TOPROL-XL) 25 MG 24 hr tablet Take half tablet by mouth every day. 08/15/22   Marykay Lex, MD  nitroGLYCERIN (NITROSTAT) 0.4 MG SL tablet Place 1 tablet (0.4 mg total) under the tongue every 5 (five) minutes x 3 doses as needed for chest pain. 01/10/17   Strader, Lennart Pall, PA-C  pantoprazole (PROTONIX) 40 MG tablet TAKE 1 TABLET BY MOUTH DAILY AT 12 NOON. 12/09/19   Marykay Lex, MD  rosuvastatin  (CRESTOR) 40 MG tablet Take 1 tablet (40 mg total) by mouth daily. 06/06/22   Marykay Lex, MD  triamcinolone cream (KENALOG) 0.1 % as needed.    [provider]  valACYclovir (VALTREX) 500 MG tablet Take 500 mg by mouth 2 (two) times daily.  10/31/22   [provider]    Family History    Family History  Problem Relation Age of Onset   Peripheral vascular disease Mother    Hyperlipidemia Mother    Alzheimer's disease Father    Mental illness Sister    Heart Problems Sister    Schizophrenia Sister    Hypotension Sister    Hyperlipidemia Brother    Heart disease Maternal Grandmother    Breast cancer Maternal Aunt 60   Breast cancer Maternal Aunt    Heart attack Neg Hx    She indicated that her mother is deceased. She indicated that her father is deceased. She indicated that her sister is alive. She indicated that her brother is alive. She indicated that her maternal grandmother is deceased. She indicated that her maternal grandfather is deceased. She indicated that her paternal grandmother is deceased. She indicated that her paternal grandfather is deceased. She indicated that the status of her neg hx is unknown.  Social History    Social History   Socioeconomic History   Marital status: Widowed    Spouse name: Not on file   Number of children: Not on file   Years of education: Not on file   Highest education level: Not on file  Occupational History   Occupation: unemployed  Tobacco Use   Smoking status: Former    Packs/day: 0.10    Years: 2.00    Additional pack years: 0.00    Total pack years: 0.20    Types: Cigarettes   Smokeless tobacco: Never   Tobacco comments:    "quit smoking in the 1980s"  Substance and Sexual Activity   Alcohol use: Yes    Alcohol/week: 2.0 standard drinks of alcohol    Types: 2 Glasses of wine per week   Drug use: No   Sexual activity: Yes    Birth control/protection: Post-menopausal  Other Topics Concern   Not on  file  Social History Narrative   She is under a significant amount of stress socially.    Her husband committed suicide several years ago - she continues to have difficulty with coping.   Social Determinants of Health   Financial Resource Strain: Not on file  Food Insecurity: Not on file  Transportation Needs: Not on file  Physical Activity: Not on file  Stress: Not on file  Social Connections: Not on file  Intimate Partner Violence: Not on file     Review of Systems    General:  No chills, fever, night sweats or weight changes.  Cardiovascular:  No chest pain, dyspnea on exertion, edema, orthopnea, palpitations, paroxysmal nocturnal dyspnea. Dermatological: No rash, lesions/masses Respiratory: No cough, dyspnea Urologic: No hematuria, dysuria Abdominal:   No nausea, vomiting, diarrhea, bright red blood per rectum, melena, or hematemesis Neurologic:  No visual changes, wkns, changes in mental status. All other systems reviewed and are otherwise negative except as noted above.  Physical Exam    VS:  BP 124/64 (BP Location: Left Arm, Patient Position: Sitting, Cuff Size: Normal)   Pulse 72   Ht 5\' 8"  (1.727 m)   Wt 160 lb 3.2 oz (72.7 kg)   SpO2 98%   BMI 24.36 kg/m  , BMI Body mass index is 24.36 kg/m. GEN: Well nourished, well developed, in no acute distress. HEENT: normal. Neck: Supple, no JVD, carotid bruits, or masses. Cardiac: RRR, no murmurs, rubs, or gallops. No clubbing, cyanosis, right lower extremity shin and ankle generalized edema with erythema.  Radials/DP/PT 2+  and equal bilaterally.  Respiratory:  Respirations regular and unlabored, clear to auscultation bilaterally. GI: Soft, nontender, nondistended, BS + x 4. MS: no deformity or atrophy. Skin: warm and dry, no rash. Neuro:  Strength and sensation are intact. Psych: Normal affect.  Accessory Clinical Findings    Recent Labs: 12/11/2022: ALT 62; BUN 21; Creatinine, Ser 1.21; Potassium 4.7; Sodium 138    Recent Lipid Panel    Component Value Date/Time   CHOL 183 12/11/2022 1504   TRIG 136 12/11/2022 1504   HDL 88 12/11/2022 1504   CHOLHDL 2.1 12/11/2022 1504   CHOLHDL 2.0 11/10/2015 0520   VLDL 20 11/10/2015 0520   LDLCALC 72 12/11/2022 1504         ECG personally reviewed by me today-none today.   Echocardiogram 11/09/2022  IMPRESSIONS     1. Severe aortic regurgitation.   2. Left ventricular ejection fraction, by estimation, is 60 to 65%. The  left ventricle has normal function. The left ventricle has no regional  wall motion abnormalities. Left ventricular diastolic parameters are  consistent with Grade I diastolic  dysfunction (impaired relaxation).   3. Right ventricular systolic function is normal. The right ventricular  size is normal.   4. The mitral valve is normal in structure. Mild mitral valve  regurgitation. No evidence of mitral stenosis. Moderate mitral annular  calcification.   5. The aortic valve is tricuspid. There is mild calcification of the  aortic valve. Aortic valve regurgitation is severe. Aortic valve sclerosis  is present, with no evidence of aortic valve stenosis. Aortic  regurgitation PHT measures 539 msec. Aortic  valve mean gradient measures 6.5 mmHg. Aortic valve Vmax measures 1.73  m/s.   6. The inferior vena cava is normal in size with greater than 50%  respiratory variability, suggesting right atrial pressure of 3 mmHg.   Comparison(s): Prior images reviewed side by side.   FINDINGS   Left Ventricle: Left ventricular ejection fraction, by estimation, is 60  to 65%. The left ventricle has normal function. The left ventricle has no  regional wall motion abnormalities. The left ventricular internal cavity  size was normal in size. There is   no left ventricular hypertrophy. Left ventricular diastolic parameters  are consistent with Grade I diastolic dysfunction (impaired relaxation).   Right Ventricle: The right ventricular size is  normal. No increase in  right ventricular wall thickness. Right ventricular systolic function is  normal.   Left Atrium: Left atrial size was normal in size.   Right Atrium: Right atrial size was normal in size.   Pericardium: There is no evidence of pericardial effusion.   Mitral Valve: The mitral valve is normal in structure. Moderate mitral  annular calcification. Mild mitral valve regurgitation. No evidence of  mitral valve stenosis.   Tricuspid Valve: The tricuspid valve is normal in structure. Tricuspid  valve regurgitation is mild . No evidence of tricuspid stenosis.   Aortic Valve: The aortic valve is tricuspid. There is mild calcification  of the aortic valve. Aortic valve regurgitation is severe. Aortic  regurgitation PHT measures 539 msec. Aortic valve sclerosis is present,  with no evidence of aortic valve stenosis.  Aortic valve mean gradient measures 6.5 mmHg. Aortic valve peak gradient  measures 12.0 mmHg. Aortic valve area, by VTI measures 1.95 cm.   Pulmonic Valve: The pulmonic valve was normal in structure. Pulmonic valve  regurgitation is not visualized. No evidence of pulmonic stenosis.   Aorta: The aortic root is normal in size and  structure.   Venous: The inferior vena cava is normal in size with greater than 50%  respiratory variability, suggesting right atrial pressure of 3 mmHg.   IAS/Shunts: No atrial level shunt detected by color flow Doppler.   Additional Comments: Severe aortic regurgitation.    Assessment & Plan   1.  Lower extremity edema, Cellulitis-contacted nurse triage line 02/01/2023 with complaints of right lower extremity swelling.  She had been to her dermatologist who prescribed antibiotics and asked if she should be taking diuretic daily.  Denies pain and claudication.  2+ DP and PT.  Homan negative. Cephalexin- given 500 mg x 2 more days to make 7 day course- Call office if symptoms have not resolved   Essential hypertension-BP  today 124/64. Maintain blood pressure log Continue metoprolol, amlodipine Low-sodium diet  Coronary artery disease-denies chest pain.  Status post STEMI with PCI and DES to RCA x 2 in 2016. Continue current medical therapy Increase physical activity as tolerated  Hyperlipidemia- 12/11/2022: Cholesterol, Total 183; HDL 88; LDL Chol Calc (NIH) 72; Triglycerides 136 High-fiber diet Continue aspirin, rosuvastatin  Disposition: Follow-up with Bailey Mech or me in 4 to 6 months   Ashleyanne Hemmingway M. Dominque Marlin NP-C     02/02/2023, 4:44 PM [facility] [address] Office [phone] Fax [phone]    I spent 14 minutes examining this patient, reviewing medications, and using patient centered shared decision making involving her cardiac care.  Prior to her visit I spent greater than 20 minutes reviewing her past medical history,  medications, and prior cardiac tests.

## 2023-02-19 DIAGNOSIS — D2261 Melanocytic nevi of right upper limb, including shoulder: Secondary | ICD-10-CM | POA: Diagnosis not present

## 2023-02-19 DIAGNOSIS — L578 Other skin changes due to chronic exposure to nonionizing radiation: Secondary | ICD-10-CM | POA: Diagnosis not present

## 2023-02-19 DIAGNOSIS — L820 Inflamed seborrheic keratosis: Secondary | ICD-10-CM | POA: Diagnosis not present

## 2023-02-19 DIAGNOSIS — I8311 Varicose veins of right lower extremity with inflammation: Secondary | ICD-10-CM | POA: Diagnosis not present

## 2023-02-19 DIAGNOSIS — I8312 Varicose veins of left lower extremity with inflammation: Secondary | ICD-10-CM | POA: Diagnosis not present

## 2023-02-19 DIAGNOSIS — L570 Actinic keratosis: Secondary | ICD-10-CM | POA: Diagnosis not present

## 2023-02-28 IMAGING — MG MM DIGITAL SCREENING BILAT W/ TOMO AND CAD
6 of 10 series · 6 of 30 positions shown · non-contrast
Comparison: Previous exam(s).

ACR Breast Density Category a: The breast tissue is almost entirely
fatty.

CLINICAL DATA: Screening.

EXAM:
DIGITAL SCREENING BILATERAL MAMMOGRAM WITH TOMOSYNTHESIS AND CAD
TECHNIQUE: Bilateral screening digital craniocaudal and mediolateral oblique
mammograms were obtained. Bilateral screening digital breast
tomosynthesis was performed. The images were evaluated with
computer-aided detection.

[R CC synth-2D (1 of 2)]
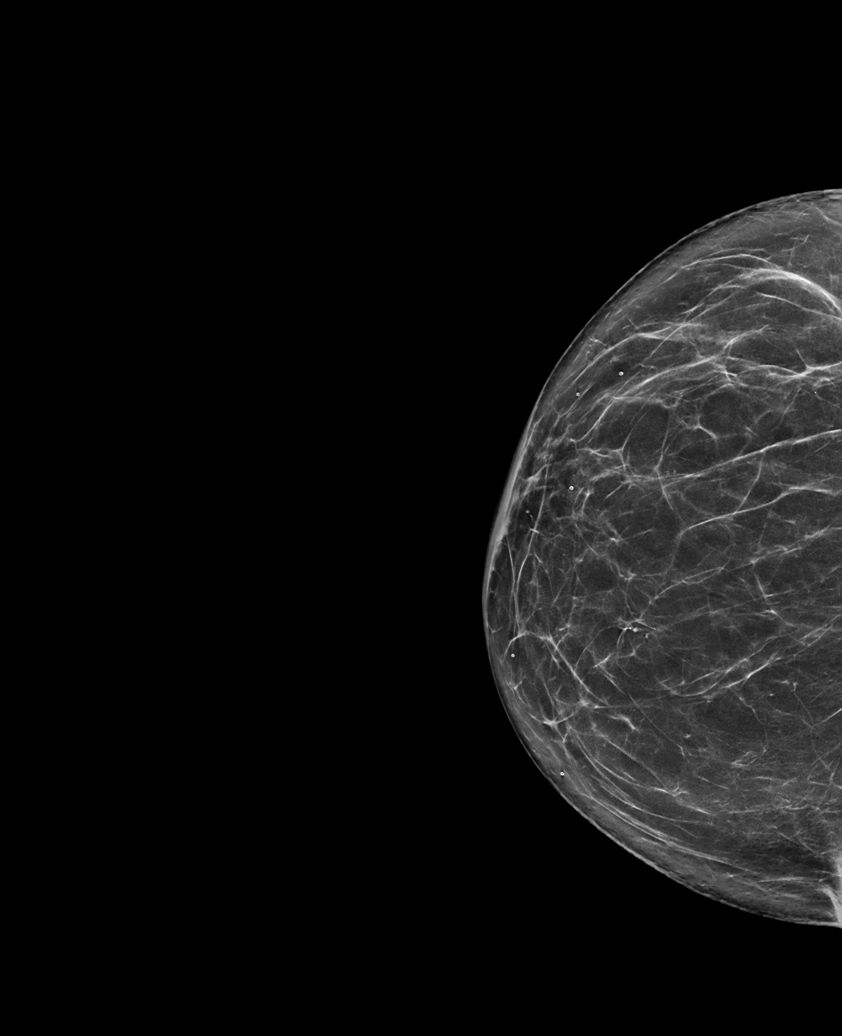

[L MLO synth-2D]
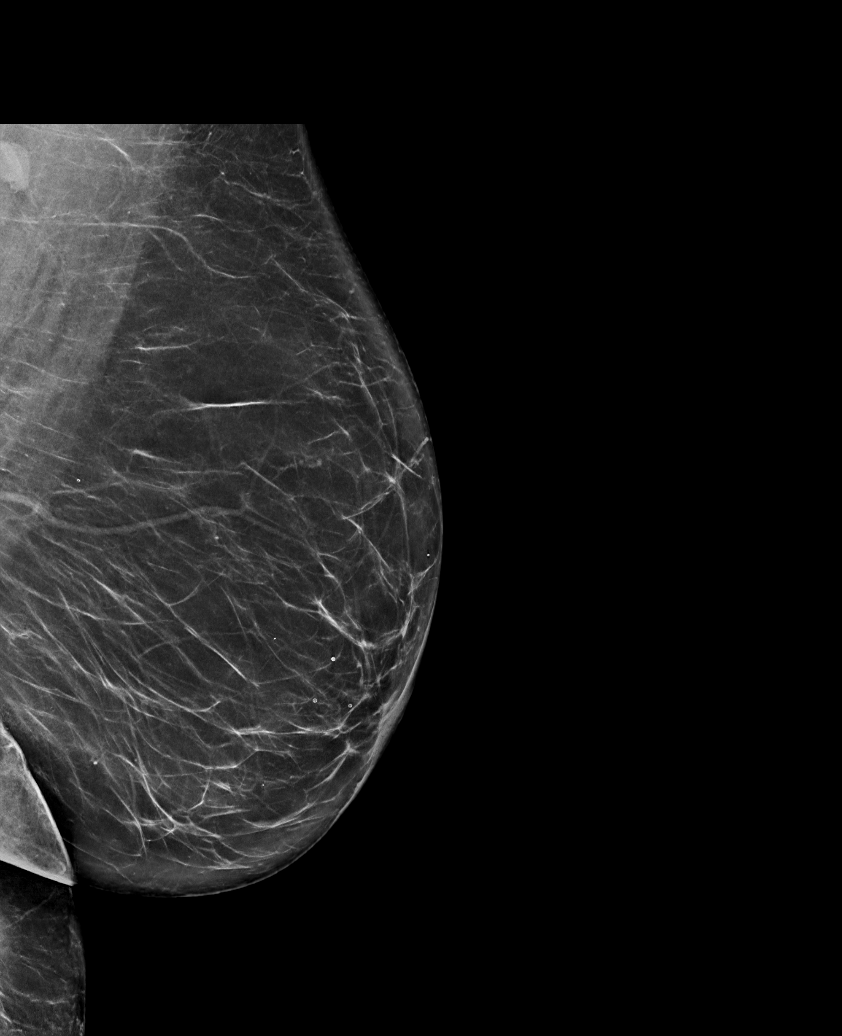

[R MLO synth-2D]
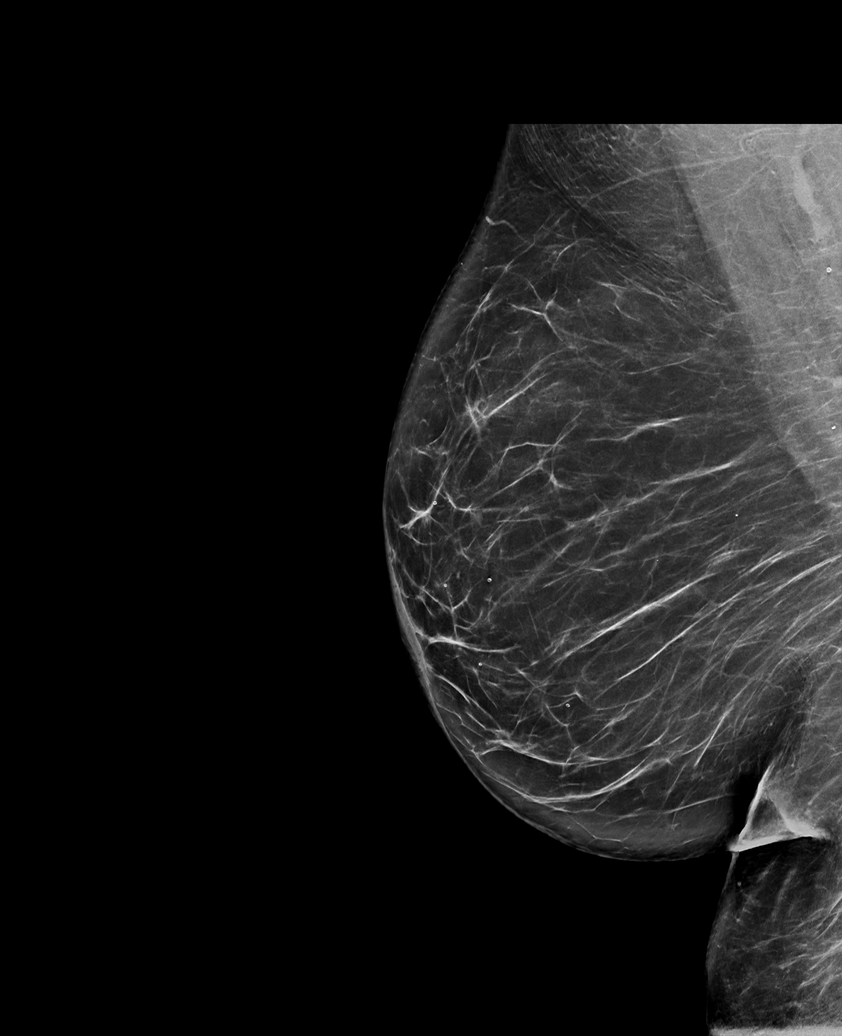

[R CC synth-2D (2 of 2)]
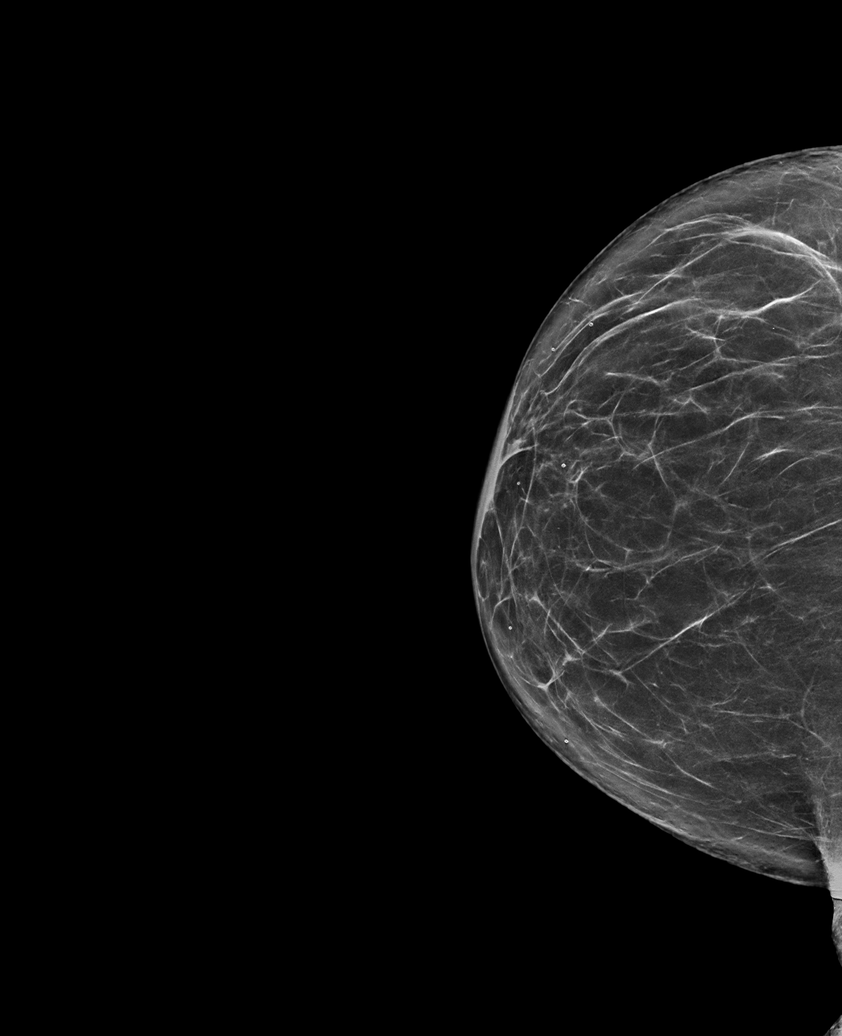

[L CC synth-2D]
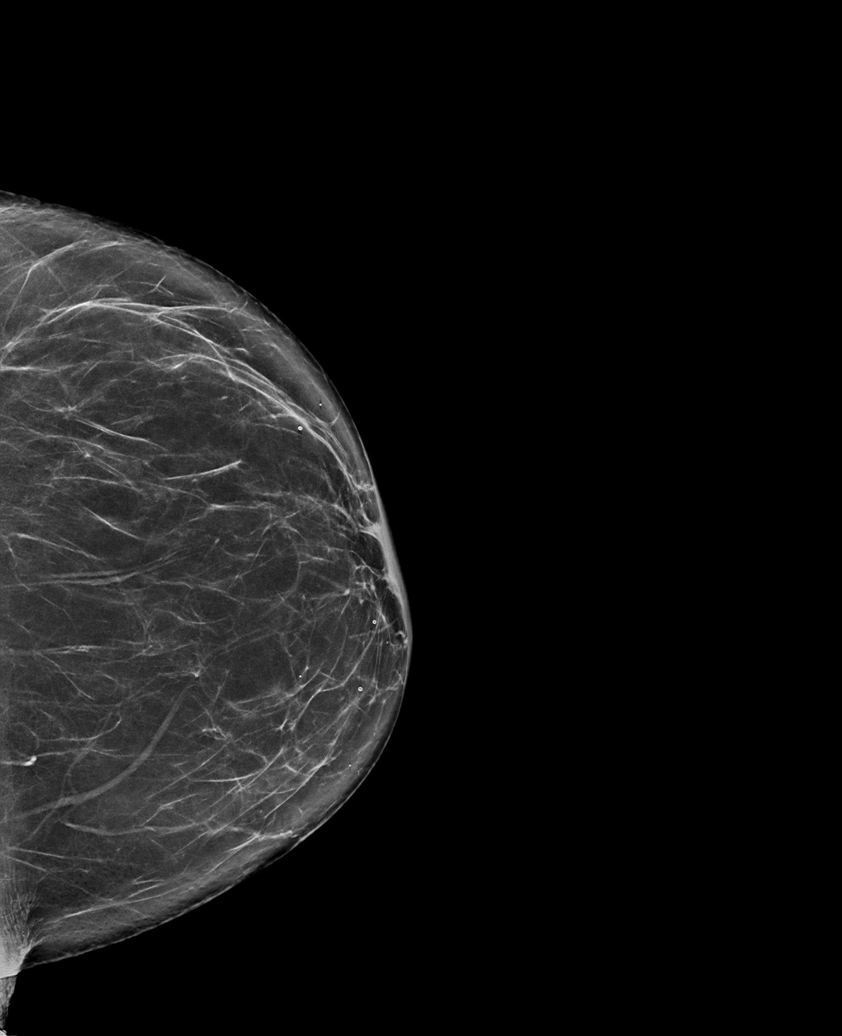

[R MLO tomo · tomo slice 45/90.0]
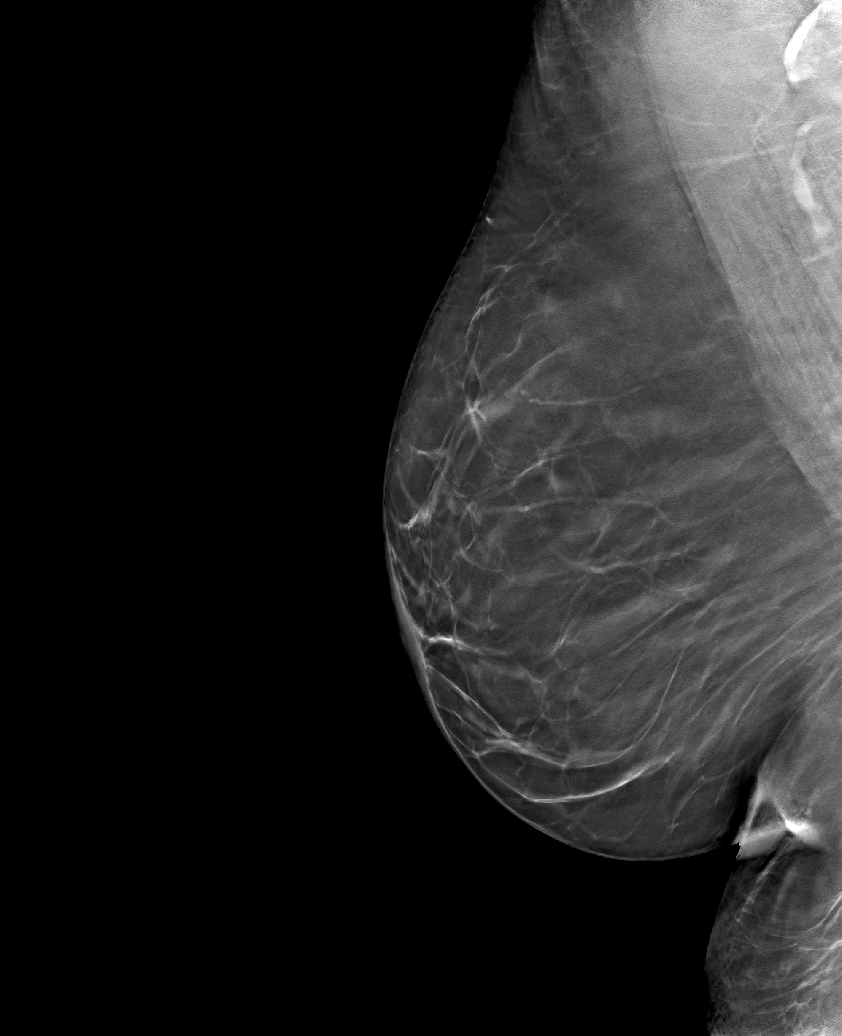

[6 of 30 positions shown; findings below may reference images not displayed]

FINDINGS: There are no findings suspicious for malignancy.
IMPRESSION: No mammographic evidence of malignancy. A result letter of this
screening mammogram will be mailed directly to the patient.

RECOMMENDATION:
Screening mammogram in one year. (Code:0E-3-N98)

BI-RADS CATEGORY  1: Negative.

## 2023-03-20 ENCOUNTER — Other Ambulatory Visit (HOSPITAL_COMMUNITY): Payer: Self-pay | Admitting: Cardiology

## 2023-03-20 DIAGNOSIS — N1832 Chronic kidney disease, stage 3b: Secondary | ICD-10-CM | POA: Diagnosis not present

## 2023-03-20 DIAGNOSIS — Z79899 Other long term (current) drug therapy: Secondary | ICD-10-CM | POA: Diagnosis not present

## 2023-03-20 DIAGNOSIS — E7800 Pure hypercholesterolemia, unspecified: Secondary | ICD-10-CM | POA: Diagnosis not present

## 2023-03-20 DIAGNOSIS — R7301 Impaired fasting glucose: Secondary | ICD-10-CM | POA: Diagnosis not present

## 2023-03-20 DIAGNOSIS — I779 Disorder of arteries and arterioles, unspecified: Secondary | ICD-10-CM

## 2023-03-20 DIAGNOSIS — I25111 Atherosclerotic heart disease of native coronary artery with angina pectoris with documented spasm: Secondary | ICD-10-CM | POA: Diagnosis not present

## 2023-03-20 DIAGNOSIS — Z0001 Encounter for general adult medical examination with abnormal findings: Secondary | ICD-10-CM | POA: Diagnosis not present

## 2023-03-20 DIAGNOSIS — I10 Essential (primary) hypertension: Secondary | ICD-10-CM | POA: Diagnosis not present

## 2023-03-22 ENCOUNTER — Other Ambulatory Visit: Payer: Self-pay | Admitting: Family Medicine

## 2023-03-22 DIAGNOSIS — E2839 Other primary ovarian failure: Secondary | ICD-10-CM

## 2023-05-01 DIAGNOSIS — R7401 Elevation of levels of liver transaminase levels: Secondary | ICD-10-CM | POA: Diagnosis not present

## 2023-05-07 ENCOUNTER — Other Ambulatory Visit: Payer: Self-pay | Admitting: Family Medicine

## 2023-05-07 DIAGNOSIS — R7401 Elevation of levels of liver transaminase levels: Secondary | ICD-10-CM

## 2023-05-12 ENCOUNTER — Other Ambulatory Visit: Payer: Self-pay | Admitting: Cardiology

## 2023-05-16 ENCOUNTER — Encounter: Payer: Self-pay | Admitting: Family Medicine

## 2023-05-16 ENCOUNTER — Other Ambulatory Visit: Payer: Self-pay | Admitting: Family Medicine

## 2023-05-16 ENCOUNTER — Ambulatory Visit
Admission: RE | Admit: 2023-05-16 | Discharge: 2023-05-16 | Disposition: A | Discharge status: Home / Self Care | Payer: Medicare Other | Source: Ambulatory Visit | Attending: Family Medicine | Admitting: Family Medicine

## 2023-05-16 DIAGNOSIS — K8020 Calculus of gallbladder without cholecystitis without obstruction: Secondary | ICD-10-CM | POA: Diagnosis not present

## 2023-05-16 DIAGNOSIS — K828 Other specified diseases of gallbladder: Secondary | ICD-10-CM

## 2023-05-16 DIAGNOSIS — R7401 Elevation of levels of liver transaminase levels: Secondary | ICD-10-CM

## 2023-05-22 NOTE — Progress Notes (Unsigned)
Cardiology Office Note:  .   Date:  05/24/2023  ID:  Laura Herrera, DOB 02-Jul-1951, MRN [id number] PCP: Laura Bussing, MD  [facility] Providers Cardiologist:  Laura Lemma, MD {    History of Present Illness: [name] is a 72 y.o. female or following for ongoing assessment management of CAD status post DES to RCA x 2 in 2016, Takotsubo cardiomyopathy, aortic valve insufficiency, essential hypertension, palpitations, hyperlipidemia, and stenosis of the right subclavian artery.  Last seen in the office by Laura Fabian, NP on 02/02/2023 complaint is of redness and swelling in her lower left leg along with dyspnea.  She was started on cephalexin for 7 days for cellulitis.  She comes today without any new symptoms.  She reminds me that she has alpha gal as well as enzyme which does not allow her to have muscle paralyzing medications during anesthesia.  She denies chest pain, palpitations, dyspnea on exertion, or fatigue.  Cellulitis is fully healed.  ROS: As above otherwise negative.  Studies Reviewed: .       Echocardiogram 11/09/2022  1. Severe aortic regurgitation.   2. Left ventricular ejection fraction, by estimation, is 60 to 65%. The  left ventricle has normal function. The left ventricle has no regional  wall motion abnormalities. Left ventricular diastolic parameters are  consistent with Grade I diastolic  dysfunction (impaired relaxation).   3. Right ventricular systolic function is normal. The right ventricular  size is normal.   4. The mitral valve is normal in structure. Mild mitral valve  regurgitation. No evidence of mitral stenosis. Moderate mitral annular  calcification.   5. The aortic valve is tricuspid. There is mild calcification of the  aortic valve. Aortic valve regurgitation is severe. Aortic valve sclerosis  is present, with no evidence of aortic valve stenosis. Aortic  regurgitation PHT measures 539 msec. Aortic  valve mean gradient  measures 6.5 mmHg. Aortic valve Vmax measures 1.73  m/s.   6. The inferior vena cava is normal in size with greater than 50%  respiratory variability, suggesting right atrial pressure of 3 mmHg.      Physical Exam:   VS:  BP 134/78   Pulse (!) 59   Ht 5\' 8"  (1.727 m)   Wt 160 lb (72.6 kg)   SpO2 99%   BMI 24.33 kg/m    Wt Readings from Last 3 Encounters:  05/24/23 160 lb (72.6 kg)  02/02/23 160 lb 3.2 oz (72.7 kg)  12/21/22 165 lb 3.2 oz (74.9 kg)    GEN: Well nourished, well developed in no acute distress NECK: No JVD; No carotid bruits CARDIAC: RRR, 2/6 regurgitant murmurs, radiation into the left carotid, rubs, gallops RESPIRATORY:  Clear to auscultation without rales, wheezing or rhonchi  ABDOMEN: Soft, non-tender, non-distended EXTREMITIES:  No edema; No deformity   ASSESSMENT AND PLAN: .    Severe aortic valve regurgitation: Will repeat her echocardiogram in April 2025.  She is currently asymptomatic.  May need to consider sending her to valvular heart clinic if she does become symptomatic for discussion of repair.   2.  CAD: History of drug-eluting stent to the RCA x 2 in 2016.  She is completely asymptomatic and without need for further cardiac ischemic testing at this time.  Continue secondary management with blood pressure control, statin therapy, purposeful exercise.   3.  Hypertension: Blood pressure is well-controlled on current medication regimen.  No changes in current management.  4.  Hypercholesterolemia:  Continues on rosuvastatin.  Goal LDL less than 70.  Will need follow-up labs if not completed by PCP on next office visit.        Signed, Laura Herrera. Laura Herrera, ANP, AACC

## 2023-05-24 ENCOUNTER — Encounter: Payer: Self-pay | Admitting: Adult Health

## 2023-05-24 ENCOUNTER — Ambulatory Visit: Payer: Medicare Other | Attending: Adult Health | Admitting: Adult Health

## 2023-05-24 VITALS — BP 134/78 | HR 59 | Ht 68.0 in | Wt 160.0 lb

## 2023-05-24 DIAGNOSIS — I2510 Atherosclerotic heart disease of native coronary artery without angina pectoris: Secondary | ICD-10-CM | POA: Diagnosis not present

## 2023-05-24 DIAGNOSIS — I351 Nonrheumatic aortic (valve) insufficiency: Secondary | ICD-10-CM | POA: Diagnosis not present

## 2023-05-24 DIAGNOSIS — E7800 Pure hypercholesterolemia, unspecified: Secondary | ICD-10-CM

## 2023-05-24 DIAGNOSIS — I10 Essential (primary) hypertension: Secondary | ICD-10-CM | POA: Diagnosis not present

## 2023-05-24 DIAGNOSIS — Z9861 Coronary angioplasty status: Secondary | ICD-10-CM

## 2023-05-24 NOTE — Patient Instructions (Addendum)
Medication Instructions:  No Changes *If you need a refill on your cardiac medications before your next appointment, please call your pharmacy*   Lab Work: No Labs If you have labs (blood work) drawn today and your tests are completely normal, you will receive your results only by: MyChart Message (if you have MyChart) OR A paper copy in the mail If you have any lab test that is abnormal or we need to change your treatment, we will call you to review the results.   Testing/Procedures: No Testing   Follow-Up: At [facility], you and your health needs are our priority.  As part of our continuing mission to provide you with exceptional heart care, we have created designated Provider Care Teams.  These Care Teams include your primary Cardiologist (physician) and Advanced Practice Providers (APPs -  Physician Assistants and Nurse Practitioners) who all work together to provide you with the care you need, when you need it.  We recommend signing up for the patient portal called "MyChart".  Sign up information is provided on this After Visit Summary.  MyChart is used to connect with patients for Virtual Visits (Telemedicine).  Patients are able to view lab/test results, encounter notes, upcoming appointments, etc.  Non-urgent messages can be sent to your provider as well.   To learn more about what you can do with MyChart, go to [email or web address].    Your next appointment:   6 month(s)  Provider:   [name], MD

## 2023-05-28 ENCOUNTER — Ambulatory Visit
Admission: RE | Admit: 2023-05-28 | Discharge: 2023-05-28 | Disposition: A | Discharge status: Home / Self Care | Payer: Medicare Other | Source: Ambulatory Visit | Attending: Family Medicine | Admitting: Family Medicine

## 2023-05-28 DIAGNOSIS — K828 Other specified diseases of gallbladder: Secondary | ICD-10-CM | POA: Diagnosis not present

## 2023-05-28 DIAGNOSIS — K449 Diaphragmatic hernia without obstruction or gangrene: Secondary | ICD-10-CM | POA: Diagnosis not present

## 2023-05-28 DIAGNOSIS — K760 Fatty (change of) liver, not elsewhere classified: Secondary | ICD-10-CM | POA: Diagnosis not present

## 2023-05-28 DIAGNOSIS — K8020 Calculus of gallbladder without cholecystitis without obstruction: Secondary | ICD-10-CM | POA: Diagnosis not present

## 2023-05-28 MED ORDER — GADOPICLENOL 0.5 MMOL/ML IV SOLN
7.0000 mL | Freq: Once | INTRAVENOUS | Status: AC | PRN
  Administered 2023-05-28: 7 mL via INTRAVENOUS

## 2023-06-18 DIAGNOSIS — K828 Other specified diseases of gallbladder: Secondary | ICD-10-CM | POA: Diagnosis not present

## 2023-06-19 ENCOUNTER — Telehealth: Payer: Self-pay

## 2023-06-19 NOTE — Telephone Encounter (Signed)
   Pre-operative Risk Assessment    Patient Name: Laura Herrera  DOB: [birth year] MRN: [id number]  LAST OFFICE VISIT: 05/24/23 WITH [name], NP NEXT OFFICE VISIT: NONE    Request for Surgical Clearance    Procedure:   GALLBLADDER SURGERY  Date of Surgery:  Clearance TBD                                 Surgeon:  DR. Axel Filler Surgeon's Group or Practice Name:  [facility]  Phone number:  [phone] Fax number:  [phone] ATTN: Brennan Bailey, CMA   Type of Clearance Requested:   - Medical  - Pharmacy:  Hold Aspirin     Type of Anesthesia:  General    Additional requests/questions:    Robley Fries   06/19/2023, 11:02 AM

## 2023-06-19 NOTE — Telephone Encounter (Signed)
   Name: Laura Herrera  DOB: [birth year]  MRN: [id number]   Primary Cardiologist: [name], MD  Chart reviewed as part of pre-operative protocol coverage. Patient was contacted 06/19/2023 in reference to pre-operative risk assessment for pending surgery as outlined below.  Laura Herrera was last seen on 05/24/2023 by me [name] NP).  Since that day, Laura Herrera has done well no chest pain, only occasional heart racing. She has also informed me that she spoke with the surgeon about her specific enzyme which does not allow her to have paralytics during anesthesia.    Therefore, based on ACC/AHA guidelines, the patient would be at acceptable risk for the planned procedure without further cardiovascular testing.   Per office protocol, if patient is without any new symptoms or concerns at the time of their virtual visit, he/she may hold ASA for 7 days prior to procedure. Please resume ASA as soon as possible postprocedure, at the discretion of the surgeon.    The patient was advised that if she develops new symptoms prior to surgery to contact our office to arrange for a follow-up visit, and she verbalized understanding.  I will route this recommendation to the requesting party via Epic fax function and remove from pre-op pool. Please call with questions.  [name], NP 06/19/2023, 11:44 AM

## 2023-06-27 NOTE — Progress Notes (Signed)
Surgical Instructions   Your procedure is scheduled on Tuesday July 03, 2023. Report to [facility] Main Entrance "A" at 5:30 A.M., then check in with the Admitting office. Any questions or running late day of surgery: call [phone]  Questions prior to your surgery date: call [phone], Monday-Friday, 8am-4pm. If you experience any cold or flu symptoms such as cough, fever, chills, shortness of breath, etc. between now and your scheduled surgery, please notify us at the above number.     Remember:  Do not eat or drink after midnight the night before your surgery  Take these medicines the morning of surgery with A SIP OF WATER  amLODipine (NORVASC)  metoprolol succinate (TOPROL-XL)  rosuvastatin (CRESTOR)   May take these medicines IF NEEDED: acetaminophen (TYLENOL)  nitroGLYCERIN (NITROSTAT) If you have to take this medication prior to surgery, please call [phone] and report this to a nurse    PER YOUR CARDIOLOGIST, STOP TAKING YOUR ASPIRIN 7 DAYS PRIOR TO SURGERY WITH THE LAST DAY BEING 06/25/2023.   One week prior to surgery, STOP taking any Aleve, Naproxen, Ibuprofen, Motrin, Advil, Goody's, BC's, all herbal medications, fish oil, and non-prescription vitamins.                     Do NOT Smoke (Tobacco/Vaping) for 24 hours prior to your procedure.  If you use a CPAP at night, you may bring your mask/headgear for your overnight stay.   You will be asked to remove any contacts, glasses, piercing's, hearing aid's, dentures/partials prior to surgery. Please bring cases for these items if needed.    Patients discharged the day of surgery will not be allowed to drive home, and someone needs to stay with them for 24 hours.  SURGICAL WAITING ROOM VISITATION Patients may have no more than 2 support people in the waiting area - these visitors may rotate.   Pre-op nurse will coordinate an appropriate time for 1 ADULT support person, who may not rotate, to accompany  patient in pre-op.  Children under the age of 48 must have an adult with them who is not the patient and must remain in the main waiting area with an adult.  If the patient needs to stay at the hospital during part of their recovery, the visitor guidelines for inpatient rooms apply.  Please refer to the [facility] website for the visitor guidelines for any additional information.   If you received a COVID test during your pre-op visit  it is requested that you wear a mask when out in public, stay away from anyone that may not be feeling well and notify your surgeon if you develop symptoms. If you have been in contact with anyone that has tested positive in the last 10 days please notify you surgeon.      Pre-operative CHG Bathing Instructions   You can play a key role in reducing the risk of infection after surgery. Your skin needs to be as free of germs as possible. You can reduce the number of germs on your skin by washing with CHG (chlorhexidine gluconate) soap before surgery. CHG is an antiseptic soap that kills germs and continues to kill germs even after washing.   DO NOT use if you have an allergy to chlorhexidine/CHG or antibacterial soaps. If your skin becomes reddened or irritated, stop using the CHG and notify one of our RNs at [phone].              TAKE A SHOWER THE  NIGHT BEFORE SURGERY AND THE DAY OF SURGERY    Please keep in mind the following:  DO NOT shave, including legs and underarms, 48 hours prior to surgery.   You may shave your face before/day of surgery.  Place clean sheets on your bed the night before surgery Use a clean washcloth (not used since being washed) for each shower. DO NOT sleep with pet's night before surgery.  CHG Shower Instructions:  Wash your face and private area with normal soap. If you choose to wash your hair, wash first with your normal shampoo.  After you use shampoo/soap, rinse your hair and body thoroughly to remove shampoo/soap  residue.  Turn the water OFF and apply half the bottle of CHG soap to a CLEAN washcloth.  Apply CHG soap ONLY FROM YOUR NECK DOWN TO YOUR TOES (washing for 3-5 minutes)  DO NOT use CHG soap on face, private areas, open wounds, or sores.  Pay special attention to the area where your surgery is being performed.  If you are having back surgery, having someone wash your back for you may be helpful. Wait 2 minutes after CHG soap is applied, then you may rinse off the CHG soap.  Pat dry with a clean towel  Put on clean pajamas    Additional instructions for the day of surgery: DO NOT APPLY any lotions, deodorants or perfumes.   Do not wear jewelry or makeup Do not wear nail polish, gel polish, artificial nails, or any other type of covering on natural nails (fingers and toes) Do not bring valuables to the hospital. [facility] is not responsible for valuables/personal belongings. Put on clean/comfortable clothes.  Please brush your teeth.  Ask your nurse before applying any prescription medications to the skin.

## 2023-06-28 ENCOUNTER — Encounter (HOSPITAL_COMMUNITY): Payer: Self-pay

## 2023-06-28 ENCOUNTER — Encounter (HOSPITAL_COMMUNITY)
Admission: RE | Admit: 2023-06-28 | Discharge: 2023-06-28 | Disposition: A | Discharge status: Home / Self Care | Payer: Medicare Other | Source: Ambulatory Visit | Attending: General Surgery | Admitting: General Surgery

## 2023-06-28 ENCOUNTER — Other Ambulatory Visit: Payer: Self-pay

## 2023-06-28 ENCOUNTER — Ambulatory Visit: Payer: Self-pay | Admitting: General Surgery

## 2023-06-28 VITALS — BP 128/53 | HR 70 | Temp 98.6°F | Resp 16 | Ht 68.0 in | Wt 160.9 lb

## 2023-06-28 DIAGNOSIS — I2510 Atherosclerotic heart disease of native coronary artery without angina pectoris: Secondary | ICD-10-CM | POA: Diagnosis not present

## 2023-06-28 DIAGNOSIS — Z01812 Encounter for preprocedural laboratory examination: Secondary | ICD-10-CM | POA: Insufficient documentation

## 2023-06-28 DIAGNOSIS — I10 Essential (primary) hypertension: Secondary | ICD-10-CM | POA: Diagnosis not present

## 2023-06-28 DIAGNOSIS — K449 Diaphragmatic hernia without obstruction or gangrene: Secondary | ICD-10-CM | POA: Insufficient documentation

## 2023-06-28 DIAGNOSIS — Z955 Presence of coronary angioplasty implant and graft: Secondary | ICD-10-CM | POA: Diagnosis not present

## 2023-06-28 DIAGNOSIS — I252 Old myocardial infarction: Secondary | ICD-10-CM | POA: Diagnosis not present

## 2023-06-28 DIAGNOSIS — K760 Fatty (change of) liver, not elsewhere classified: Secondary | ICD-10-CM | POA: Insufficient documentation

## 2023-06-28 DIAGNOSIS — K8020 Calculus of gallbladder without cholecystitis without obstruction: Secondary | ICD-10-CM | POA: Diagnosis not present

## 2023-06-28 DIAGNOSIS — Z91014 Allergy to mammalian meats: Secondary | ICD-10-CM | POA: Diagnosis not present

## 2023-06-28 DIAGNOSIS — Z01818 Encounter for other preprocedural examination: Secondary | ICD-10-CM | POA: Diagnosis not present

## 2023-06-28 DIAGNOSIS — K828 Other specified diseases of gallbladder: Secondary | ICD-10-CM | POA: Insufficient documentation

## 2023-06-28 HISTORY — DX: Allergy to other foods: Z91.018

## 2023-06-28 LAB — BASIC METABOLIC PANEL
Anion gap: 9 (ref 5–15)
BUN: 10 mg/dL (ref 8–23)
CO2: 22 mmol/L (ref 22–32)
Calcium: 9.9 mg/dL (ref 8.9–10.3)
Chloride: 109 mmol/L (ref 98–111)
Creatinine, Ser: 1.45 mg/dL — ABNORMAL HIGH (ref 0.44–1.00)
GFR, Estimated: 39 mL/min — ABNORMAL LOW (ref >60)
Glucose, Bld: 99 mg/dL (ref 70–99)
Potassium: 3.2 mmol/L — ABNORMAL LOW (ref 3.5–5.1)
Sodium: 140 mmol/L (ref 135–145)

## 2023-06-28 LAB — CBC
HCT: 40.8 % (ref 36.0–46.0)
Hemoglobin: 13.6 g/dL (ref 12.0–15.0)
MCH: 33.1 pg (ref 26.0–34.0)
MCHC: 33.3 g/dL (ref 30.0–36.0)
MCV: 99.3 fL (ref 80.0–100.0)
Platelets: 209 10*3/uL (ref 150–400)
RBC: 4.11 MIL/uL (ref 3.87–5.11)
RDW: 12.3 % (ref 11.5–15.5)
WBC: 6.6 10*3/uL (ref 4.0–10.5)
nRBC: 0 % (ref 0.0–0.2)

## 2023-06-28 NOTE — Progress Notes (Signed)
PCP - Dr. Carilyn Goodpasture Koirala Cardiologist - Dr. [name]  PPM/ICD - denies Device Orders - na Rep Notified - na  Chest x-ray - 10/09/2021 EKG - 05/24/2023 Stress Test -  ECHO - 11/27/2022 Cardiac Cath - 11/10/2015  Sleep Study - denies CPAP - na  Non-diabetic  Blood Thinner Instructions: denies Aspirin Instructions:last dose 06/25/2023  ERAS Protcol - NPO  COVID TEST- na  Anesthesia review: Yes. CAD, HTN, MI, high cholesterol  Patient denies shortness of breath, fever, cough and chest pain at PAT appointment   All instructions explained to the patient, with a verbal understanding of the material. Patient agrees to go over the instructions while at home for a better understanding. Patient also instructed to self quarantine after being tested for COVID-19. The opportunity to ask questions was provided.

## 2023-06-28 NOTE — H&P (Signed)
Chief Complaint: New Consultation       History of Present Illness: Bobbi Claes is a 72 y.o. female who is seen today as an office consultation at the request of Dr. Docia Chuck for evaluation of New Consultation .     Patient is a 72 year old female, comes in with a history of CAD status post angioplasty and stents, aortic valve insufficiency, pseudocholinesterase deficiency, who comes in with mass to the gallbladder.   Patient's had multiple studies originally in March 2023 which revealed a abnormal appearance of the gallbladder.  There was central thickening versus a mass.  Patient subsequently underwent ultrasound.  Ultrasound revealed a possible 1.4 cm polyp/mass within the mid gallbladder.  She subsequently underwent an MRI.  This did show a suspicion for mass however appear to be benign etiology.   Patient states that she has had no symptoms with any gallbladder attacks.  She has alpha gal and does not take any red meat.   MRI report per below: IMPRESSION: 1. Unchanged appearance of the gallbladder, with circumferential, masslike stricturing in the mid gallbladder, and mild associated wall thickening and adenomyomatosis of the gallbladder fundus. Appearance is modestly suspicious for mass, however unchanged when compared to CT dated 10/09/2021, generally reassuring for a benign etiology such as chronic sequelae of prior infection or inflammation. Nonetheless, recommend surgical consultation for further assessment and consideration of cholecystectomy. 2. Cholelithiasis. 3. No evidence of lymphadenopathy or metastatic disease in the abdomen. 4. Hepatic steatosis. 5. Large hiatal hernia.   She has had a previous open appendectomy as a child.     Review of Systems: A complete review of systems was obtained from the patient.  I have reviewed this information and discussed as appropriate with the patient.  See HPI as well for other ROS.   Review of Systems  Constitutional:   Negative for fever.  HENT:  Negative for congestion.   Eyes:  Negative for blurred vision.  Respiratory:  Negative for cough, shortness of breath and wheezing.   Cardiovascular:  Negative for chest pain and palpitations.  Gastrointestinal:  Negative for heartburn.  Genitourinary:  Negative for dysuria.  Musculoskeletal:  Negative for myalgias.  Skin:  Negative for rash.  Neurological:  Negative for dizziness and headaches.  Psychiatric/Behavioral:  Negative for depression and suicidal ideas.   All other systems reviewed and are negative.       Medical History: Past Medical History      Past Medical History:  Diagnosis Date   Anxiety     Heart valve disease           Problem List  There is no problem list on file for this patient.      Past Surgical History       Past Surgical History:  Procedure Laterality Date   ANGIOPLASTY / STENTING FEMORAL       APPENDECTOMY       Cardiac catheterization       Dilation and curettage of uterus             Allergies       Allergies  Allergen Reactions   Cholinesterase Inhibitor(Carbamate) Other (See Comments)      Other reaction(s): Other (See Comments)  Enzyme deficiency which causes muscle paralyses after receiving anesthesia.   Epinephrine Other (See Comments) and Palpitations      Palpitations and involuntary body jerking  Palpitations and involuntary body jerking   Palpitations and involuntary body jerking   Other Other (See Comments), Rash and  Swelling      Crabs, lobster, crayfish; tissues in joints swelled   Sensitive to the electrodes   Sensitive to the electrodes  Tears skin off, and "paper" tape also   Shellfish Derived Other (See Comments) and Swelling      Crabs, lobster, crayfish; tissues in joints swelled   Vancomycin Hives and Rash      severe   severe    severe    severe   Shellfish Containing Products Swelling      Crabs, lobster, crayfish; tissues in joints swelled   Adhesive Tape-Silicones  Other (See Comments)      Tears skin off, and "paper" tape also        Medications Ordered Prior to Encounter        Current Outpatient Medications on File Prior to Visit  Medication Sig Dispense Refill   acetaminophen (TYLENOL) 500 MG tablet Take 500 mg by mouth       ALPRAZolam (XANAX) 0.25 MG tablet TAKE 1 TABLET BY MOUTH TWICE DAILY AS NEEDED. AVOID REGULAR OR DAILY USE 90 DAYS       amLODIPine (NORVASC) 10 MG tablet Take 1 tablet by mouth once daily       aspirin (ASPIR-81 ORAL)         cephalexin (KEFLEX) 500 MG capsule TAKE ONE CAPSULE BY MOUTH TWICE DAILY FOR 7 DAYS       cholecalciferol (VITAMIN D3) 1000 unit tablet Take 1,000 Units by mouth once daily       EPINEPHrine (EPIPEN) 0.3 mg/0.3 mL auto-injector USE AS DIRECTED AS NEEDED       FUROsemide (LASIX) 20 MG tablet Take 20 mg by mouth as needed       hydroCHLOROthiazide (HYDRODIURIL) 25 MG tablet         metoprolol succinate (TOPROL-XL) 25 MG XL tablet         nitroGLYcerin (NITROSTAT) 0.4 MG SL tablet Place 0.4 mg under the tongue every 5 (five) minutes as needed for Chest pain May take up to 3 doses.       pantoprazole (PROTONIX) 40 MG DR tablet Take 1 tablet by mouth 2 (two) times daily       rosuvastatin (CRESTOR) 40 MG tablet Take 40 mg by mouth once daily       triamcinolone 0.1 % cream APPLY TO ITCHY AREAS ON SKIN TWICE DAILY AS NEEDED       valACYclovir (VALTREX) 500 MG tablet TAKE 1 TABLET BY MOUTH EVERY 12 HOURS FOR 3 DAYS FOR OUTBREAK        No current facility-administered medications on file prior to visit.        Family History       Family History  Problem Relation Age of Onset   Coronary Artery Disease (Blocked arteries around heart) Mother     Skin cancer Father     Diabetes Sister          Tobacco Use History  Social History        Tobacco Use  Smoking Status Former   Types: Cigarettes   Start date: 2004  Smokeless Tobacco Never        Social History  Social History          Socioeconomic History   Marital status: Widowed  Tobacco Use   Smoking status: Former      Types: Cigarettes      Start date: 2004   Smokeless tobacco: Never  Substance and Sexual Activity  Alcohol use: Yes   Drug use: Never        Objective:          Vitals:    06/18/23 1449 06/18/23 1455  BP: 130/58    Pulse: 69    Temp: 36.8 C (98.3 F)    SpO2: 98%    Weight: 72.5 kg (159 lb 12.8 oz)    Height: 172.7 cm (5\' 8" )    PainSc:   0-No pain  PainLoc:   Abdomen    Body mass index is 24.3 kg/m.   Physical Exam Constitutional:      Appearance: Normal appearance.  HENT:     Head: Normocephalic and atraumatic.     Mouth/Throat:     Mouth: Mucous membranes are moist.     Pharynx: Oropharynx is clear.  Eyes:     General: No scleral icterus.    Pupils: Pupils are equal, round, and reactive to light.  Cardiovascular:     Rate and Rhythm: Normal rate and regular rhythm.     Pulses: Normal pulses.     Heart sounds: No murmur heard.    No friction rub. No gallop.  Pulmonary:     Effort: Pulmonary effort is normal. No respiratory distress.     Breath sounds: Normal breath sounds. No stridor.  Abdominal:     General: Abdomen is flat.  Musculoskeletal:        General: No swelling.  Skin:    General: Skin is warm.  Neurological:     General: No focal deficit present.     Mental Status: She is alert and oriented to person, place, and time. Mental status is at baseline.  Psychiatric:        Mood and Affect: Mood normal.        Thought Content: Thought content normal.        Judgment: Judgment normal.        Assessment and Plan:  Diagnoses and all orders for this visit:   Gallbladder mass     Innocence Sosna is a 72 y.o. female      72 year old female with a questionable gallbladder mass. Patient with a fairly complicated cardiology and medical history.  Patient currently with aortic insufficiency, CAD and pseudo cholinesterase deficiency.   We will obtain  cardiac evaluation per Dr. Herbie Baltimore for general anesthesia and a lap chole.  I do believe removing the gallbladder would be beneficial if able to do so safely.  I discussed with the patient if she is at a medium to high risk we would have a conversation in regards to possible observation.  If she has appropriate wrist we will have her scheduled for surgery.    We will proceed to the OR for a lap cholecystectomy. All risks and benefits were discussed with the patient to generally include: infection, bleeding, possible need for post op ERCP, damage to the bile ducts, and bile leak. Alternatives were offered and described.  All questions were answered and the patient voiced understanding of the procedure and wishes to proceed at this point with a laparoscopic cholecystectomy           No follow-ups on file.   Axel Filler, MD, [facility], [facility]

## 2023-06-28 NOTE — H&P (View-Only) (Signed)
Chief Complaint: New Consultation       History of Present Illness: Laura Herrera is a 72 y.o. female who is seen today as an office consultation at the request of Dr. Docia Chuck for evaluation of New Consultation .     Patient is a 72 year old female, comes in with a history of CAD status post angioplasty and stents, aortic valve insufficiency, pseudocholinesterase deficiency, who comes in with mass to the gallbladder.   Patient's had multiple studies originally in March 2023 which revealed a abnormal appearance of the gallbladder.  There was central thickening versus a mass.  Patient subsequently underwent ultrasound.  Ultrasound revealed a possible 1.4 cm polyp/mass within the mid gallbladder.  She subsequently underwent an MRI.  This did show a suspicion for mass however appear to be benign etiology.   Patient states that she has had no symptoms with any gallbladder attacks.  She has alpha gal and does not take any red meat.   MRI report per below: IMPRESSION: 1. Unchanged appearance of the gallbladder, with circumferential, masslike stricturing in the mid gallbladder, and mild associated wall thickening and adenomyomatosis of the gallbladder fundus. Appearance is modestly suspicious for mass, however unchanged when compared to CT dated 10/09/2021, generally reassuring for a benign etiology such as chronic sequelae of prior infection or inflammation. Nonetheless, recommend surgical consultation for further assessment and consideration of cholecystectomy. 2. Cholelithiasis. 3. No evidence of lymphadenopathy or metastatic disease in the abdomen. 4. Hepatic steatosis. 5. Large hiatal hernia.   She has had a previous open appendectomy as a child.     Review of Systems: A complete review of systems was obtained from the patient.  I have reviewed this information and discussed as appropriate with the patient.  See HPI as well for other ROS.   Review of Systems  Constitutional:   Negative for fever.  HENT:  Negative for congestion.   Eyes:  Negative for blurred vision.  Respiratory:  Negative for cough, shortness of breath and wheezing.   Cardiovascular:  Negative for chest pain and palpitations.  Gastrointestinal:  Negative for heartburn.  Genitourinary:  Negative for dysuria.  Musculoskeletal:  Negative for myalgias.  Skin:  Negative for rash.  Neurological:  Negative for dizziness and headaches.  Psychiatric/Behavioral:  Negative for depression and suicidal ideas.   All other systems reviewed and are negative.       Medical History: Past Medical History      Past Medical History:  Diagnosis Date   Anxiety     Heart valve disease           Problem List  There is no problem list on file for this patient.      Past Surgical History       Past Surgical History:  Procedure Laterality Date   ANGIOPLASTY / STENTING FEMORAL       APPENDECTOMY       Cardiac catheterization       Dilation and curettage of uterus             Allergies       Allergies  Allergen Reactions   Cholinesterase Inhibitor(Carbamate) Other (See Comments)      Other reaction(s): Other (See Comments)  Enzyme deficiency which causes muscle paralyses after receiving anesthesia.   Epinephrine Other (See Comments) and Palpitations      Palpitations and involuntary body jerking  Palpitations and involuntary body jerking   Palpitations and involuntary body jerking   Other Other (See Comments), Rash and  Swelling      Crabs, lobster, crayfish; tissues in joints swelled   Sensitive to the electrodes   Sensitive to the electrodes  Tears skin off, and "paper" tape also   Shellfish Derived Other (See Comments) and Swelling      Crabs, lobster, crayfish; tissues in joints swelled   Vancomycin Hives and Rash      severe   severe    severe    severe   Shellfish Containing Products Swelling      Crabs, lobster, crayfish; tissues in joints swelled   Adhesive Tape-Silicones  Other (See Comments)      Tears skin off, and "paper" tape also        Medications Ordered Prior to Encounter        Current Outpatient Medications on File Prior to Visit  Medication Sig Dispense Refill   acetaminophen (TYLENOL) 500 MG tablet Take 500 mg by mouth       ALPRAZolam (XANAX) 0.25 MG tablet TAKE 1 TABLET BY MOUTH TWICE DAILY AS NEEDED. AVOID REGULAR OR DAILY USE 90 DAYS       amLODIPine (NORVASC) 10 MG tablet Take 1 tablet by mouth once daily       aspirin (ASPIR-81 ORAL)         cephalexin (KEFLEX) 500 MG capsule TAKE ONE CAPSULE BY MOUTH TWICE DAILY FOR 7 DAYS       cholecalciferol (VITAMIN D3) 1000 unit tablet Take 1,000 Units by mouth once daily       EPINEPHrine (EPIPEN) 0.3 mg/0.3 mL auto-injector USE AS DIRECTED AS NEEDED       FUROsemide (LASIX) 20 MG tablet Take 20 mg by mouth as needed       hydroCHLOROthiazide (HYDRODIURIL) 25 MG tablet         metoprolol succinate (TOPROL-XL) 25 MG XL tablet         nitroGLYcerin (NITROSTAT) 0.4 MG SL tablet Place 0.4 mg under the tongue every 5 (five) minutes as needed for Chest pain May take up to 3 doses.       pantoprazole (PROTONIX) 40 MG DR tablet Take 1 tablet by mouth 2 (two) times daily       rosuvastatin (CRESTOR) 40 MG tablet Take 40 mg by mouth once daily       triamcinolone 0.1 % cream APPLY TO ITCHY AREAS ON SKIN TWICE DAILY AS NEEDED       valACYclovir (VALTREX) 500 MG tablet TAKE 1 TABLET BY MOUTH EVERY 12 HOURS FOR 3 DAYS FOR OUTBREAK        No current facility-administered medications on file prior to visit.        Family History       Family History  Problem Relation Age of Onset   Coronary Artery Disease (Blocked arteries around heart) Mother     Skin cancer Father     Diabetes Sister          Tobacco Use History  Social History        Tobacco Use  Smoking Status Former   Types: Cigarettes   Start date: 2004  Smokeless Tobacco Never        Social History  Social History          Socioeconomic History   Marital status: Widowed  Tobacco Use   Smoking status: Former      Types: Cigarettes      Start date: 2004   Smokeless tobacco: Never  Substance and Sexual Activity  Alcohol use: Yes   Drug use: Never        Objective:          Vitals:    06/18/23 1449 06/18/23 1455  BP: 130/58    Pulse: 69    Temp: 36.8 C (98.3 F)    SpO2: 98%    Weight: 72.5 kg (159 lb 12.8 oz)    Height: 172.7 cm (5\' 8" )    PainSc:   0-No pain  PainLoc:   Abdomen    Body mass index is 24.3 kg/m.   Physical Exam Constitutional:      Appearance: Normal appearance.  HENT:     Head: Normocephalic and atraumatic.     Mouth/Throat:     Mouth: Mucous membranes are moist.     Pharynx: Oropharynx is clear.  Eyes:     General: No scleral icterus.    Pupils: Pupils are equal, round, and reactive to light.  Cardiovascular:     Rate and Rhythm: Normal rate and regular rhythm.     Pulses: Normal pulses.     Heart sounds: No murmur heard.    No friction rub. No gallop.  Pulmonary:     Effort: Pulmonary effort is normal. No respiratory distress.     Breath sounds: Normal breath sounds. No stridor.  Abdominal:     General: Abdomen is flat.  Musculoskeletal:        General: No swelling.  Skin:    General: Skin is warm.  Neurological:     General: No focal deficit present.     Mental Status: She is alert and oriented to person, place, and time. Mental status is at baseline.  Psychiatric:        Mood and Affect: Mood normal.        Thought Content: Thought content normal.        Judgment: Judgment normal.        Assessment and Plan:  Diagnoses and all orders for this visit:   Gallbladder mass     Mckenzie Duhr is a 72 y.o. female      72 year old female with a questionable gallbladder mass. Patient with a fairly complicated cardiology and medical history.  Patient currently with aortic insufficiency, CAD and pseudo cholinesterase deficiency.   We will obtain  cardiac evaluation per Dr. Herbie Baltimore for general anesthesia and a lap chole.  I do believe removing the gallbladder would be beneficial if able to do so safely.  I discussed with the patient if she is at a medium to high risk we would have a conversation in regards to possible observation.  If she has appropriate wrist we will have her scheduled for surgery.    We will proceed to the OR for a lap cholecystectomy. All risks and benefits were discussed with the patient to generally include: infection, bleeding, possible need for post op ERCP, damage to the bile ducts, and bile leak. Alternatives were offered and described.  All questions were answered and the patient voiced understanding of the procedure and wishes to proceed at this point with a laparoscopic cholecystectomy           No follow-ups on file.   Axel Filler, MD, [facility], [facility]

## 2023-06-29 ENCOUNTER — Encounter (HOSPITAL_COMMUNITY): Payer: Self-pay

## 2023-06-29 NOTE — Progress Notes (Signed)
Anesthesia Chart Review:  Case: [id number] Date/Time: 07/03/23 0715   Procedure: LAPAROSCOPIC CHOLECYSTECTOMY   Anesthesia type: General   Pre-op diagnosis: GALLBLADDER MASS   Location: [facility] ROOM [facility] / [facility]   Surgeons: Axel Filler, MD       DISCUSSION: Patient is a 72 year old female scheduled for the above procedure.  History includes former smoker, postoperative N/V, HTN, lipidemia, CAD STEMI s/p complex PCI with DES covering proximal and mid RCA, DES ostial RCA, PTCA distal RCA 01/11/15; NSTEMI with Takotsubo cardiomyopathy pattern with EF 30%, patent RCA stents 11/10/15->EF 65-70% 11/11/15), aortic valve insufficiency (severe AR 11/2022), pseudocholinesterase deficiency, psoriasis, anxiety.  Alcohol use is documented as 20 standard drinks (wine or vodka) per week. She has alpha gal allergy and does not take any red meat.   She is followed by cardiologist Dr. Herbie Baltimore for CAD and aortic insufficiency. She has had moderate-severe to severe AI on echocardiograms dating back to at least 06/2021. Her echocardiograms in 2023 and in April 2024 showed severe AI. 11/09/22 echo showed LVEF 60-65%, no regional wall motion abnormalities, grade 1 diastolic dysfunction, normal RV systolic function, mild MR, severe AI with AR PHT 539 msec, AV mean gradient 6.5 mmHg, AV Vmax 1.73 m/s. Per Dr. Herbie Baltimore on 11/27/22, "Thankfully, she is pretty asymptomatic and the left ventricle size and function is still stable and normal.  At this point we talked about concerning symptoms of heart failure/dyspnea etc. that would potentially indicate symptomatic aortic regurgitation." Annual echo follow-up planned.  - Last office visit with [name], NP on 05/24/23. She was asymptomatic. Follow-up echo in April 2025 planned. She completed preoperative evaluation on 06/19/23, "Laura Herrera was last seen on 05/24/2023 by me [name] NP).  Since that day, Laura Herrera has done well no chest pain, only occasional  heart racing. She has also informed me that she spoke with the surgeon about her specific enzyme which does not allow her to have paralytics during anesthesia.     Therefore, based on ACC/AHA guidelines, the patient would be at acceptable risk for the planned procedure without further cardiovascular testing.    Per office protocol, if patient is without any new symptoms or concerns at the time of their virtual visit, he/she may hold ASA for 7 days prior to procedure. Please resume ASA as soon as possible postprocedure, at the discretion of the surgeon..."     Reported last aspirin dose 06/25/2023.   Discussed with anesthesiologist Willette Alma, MD anesthesia team to evaluate on the day of surgery.    VS: BP (!) 128/53   Pulse 70   Temp 37 C   Resp 16   Ht 5\' 8"  (1.727 m)   Wt 73 kg   SpO2 99%   BMI 24.46 kg/m    PROVIDERS: Koirala, Dibas, MD is PCP [name], MD is cardiologist   LABS: Preoperative labs noted. Creatinine 1.45, previously ~ 1.25 with labs since March 2023 in [facility]. AST and ALT mildly elevated on 12/11/22 at 56 and 62, respectively. CBC normal on 06/28/23.  (all labs ordered are listed, but only abnormal results are displayed)  Labs Reviewed  BASIC METABOLIC PANEL - Abnormal; Notable for the following components:      Result Value   Potassium 3.2 (*)    Creatinine, Ser 1.45 (*)    GFR, Estimated 39 (*)    All other components within normal limits  CBC     IMAGES: MRI Abd 05/28/23: IMPRESSION: 1. Unchanged appearance  of the gallbladder, with circumferential, masslike stricturing in the mid gallbladder, and mild associated wall thickening and adenomyomatosis of the gallbladder fundus. Appearance is modestly suspicious for mass, however unchanged when compared to CT dated 10/09/2021, generally reassuring for a benign etiology such as chronic sequelae of prior infection or inflammation. Nonetheless, recommend surgical consultation for further  assessment and consideration of cholecystectomy. 2. Cholelithiasis. 3. No evidence of lymphadenopathy or metastatic disease in the abdomen. 4. Hepatic steatosis. 5. Large hiatal hernia. - Aortic Atherosclerosis (ICD10-I70.0).   EKG: 05/24/23: Sinus bradycardia at 59 bpm When compared with ECG of 09-Oct-2021 16:07, PREVIOUS ECG IS PRESENT Confirmed by [name] [phone]) on 05/24/2023 4:01:10 PM   CV: US Carotid 12/21/2022: Summary:  - Right Carotid: Velocities in the right ICA are consistent with a 1-39% stenosis. The ECA appears >50% stenosed.  - Left Carotid: Velocities in the left ICA are consistent with a high end range 1-39% stenosis. The ECA appears >50% stenosed.  - Vertebrals:  Bilateral vertebral arteries demonstrate antegrade flow.  - Subclavians: Normal flow hemodynamics were seen in bilateral subclavian arteries.    Echo 11/09/2022: IMPRESSIONS   1. Severe aortic regurgitation.   2. Left ventricular ejection fraction, by estimation, is 60 to 65%. The  left ventricle has normal function. The left ventricle has no regional  wall motion abnormalities. Left ventricular diastolic parameters are  consistent with Grade I diastolic  dysfunction (impaired relaxation).   3. Right ventricular systolic function is normal. The right ventricular  size is normal.   4. The mitral valve is normal in structure. Mild mitral valve  regurgitation. No evidence of mitral stenosis. Moderate mitral annular  calcification.   5. The aortic valve is tricuspid. There is mild calcification of the  aortic valve. Aortic valve regurgitation is severe. Aortic valve sclerosis  is present, with no evidence of aortic valve stenosis. Aortic  regurgitation PHT measures 539 msec. Aortic  valve mean gradient measures 6.5 mmHg. Aortic valve Vmax measures 1.73  m/s.   6. The inferior vena cava is normal in size with greater than 50%  respiratory variability, suggesting right atrial pressure of 3  mmHg.   Comparison(s): Prior images reviewed side by side.  - 05/16/22 EF 60-65%, no regional wall motion abnormalities, normal RV systolic function, mildly elevated PASP, RVSP 38.3 mmHg, severe AI with AR PHT measures 393 msec, mild MR, mild TR - 06/13/21 EF 60-65%. Moderate-Severe AI.    Cardiac cath 11/10/2015: Ost LAD lesion, 40% stenosed. Ost LM lesion, 30% stenosed. Prox Cx lesion, 20% stenosed. Mid LAD to Dist LAD lesion, 10% stenosed. There is moderate to severe left ventricular systolic dysfunction.   Moderately severe LV dysfunction with severe hypo-to akinesis involving the entire anterior anterolateral wall extending to the apex with an ejection fraction of 30%.   Smooth ostial narrowing of the left main 30%; eccentric ostial 40% LAD stenosis seen on only selected views.  Smooth mild 10% mid LAD narrowing; normal small ramus immediate vessel; tortuous left circumflex: Artery with mild 20% proximal narrowing; and widely patent ostial and mid RCA stents.   Normal fractional flow reserve involving flow via the left main and ostial LAD.  Past Medical History:  Diagnosis Date   Allergy to alpha-gal    Aortic valve insufficiency 06/2021   Echo read as normal EF, normal wall motions.  Severe MAC.  Moderate to severe AI.=> On review with imaging cardiologist, this appears to be an over estimation.  More consistent with mild to moderate  AI.   CAD S/P percutaneous coronary angioplasty 01/2015   a. 01/2015 Inf STEMI/PCI: ostRCA 80% (Promus DES 2.75x12 - 3.1 mm), 100% & 70% p-mRCA covered with 1 DES (Promus DES 2.5x24  - 2.8 mm ), dRCA 90%(PTCA) -10% ;  b. 01/2015 Relook: LM nl, mLAD 20%, LCX nl- L->R collats, Thrombus in p-mRCA stent->Aggrastat;  c. 01/2015 Echo: EF 60-65%, no RWMA, mod AI, PAP . d. 11/2015: CATH: patent stents, FFR neg LM-LAD   Chest pain with high risk for cardiac etiology 11/19/2015   Cardiac catheterization showed stable coronaries with patent RCA stents. FFR of LAD  negative. --> LV gram EF suggested Takotsubo versus LAD spasm.   Complication of anesthesia    pseudo cholinesterace deficiency   Depression    Dyslipidemia    Generalized anxiety disorder    Currently being treated only with PRN Xanax for panic attacks.   Headache    "used to have them daily; I no longer do" (11/09/2015)   Hypertension    Insomnia    Kidney stones    "years ago; passed it"   MRSA infection    h/o MRSA infection in the right foot   NSTEMI (non-ST elevated myocardial infarction) (HCC) 11/09/2015   No obvious culprit lesion found.  Thought to be potentially Takotsubo.   PONV (postoperative nausea and vomiting)    Pseudocholinesterase deficiency    Psoriasis    hand and foot (11/09/2015)   ST elevation myocardial infarction (STEMI) of inferior wall (HCC) 01/2015   Severe RCA disease.  Ostial to proximal.  Single stent with PTCA of distal RCA and the PDA.Marland Kitchen    Past Surgical History:  Procedure Laterality Date   ANGIOPLASTY  01/12/2015   Procedure: Angioplasty;  Surgeon: Lyn Records, MD;  Location: [facility] INVASIVE CV LAB;  Service: Cardiovascular;; Unable to wire RCA -- treated plaque protrusion in pRCA stent with Aggrastat infusion.   APPENDECTOMY     BREAST BIOPSY     CARDIAC CATHETERIZATION N/A 01/11/2015   Procedure: Left Heart Cath and Coronary Angiography;  Surgeon: Marykay Lex, MD;  Location: [facility] INVASIVE CV LAB;  Service: Cardiovascular;  ost RCA 80% (calcified), pRCA 100% (followed by 70% tandem lesion), dRCA 90%.   Mild to moderate disease in LAD and circumflex   CARDIAC CATHETERIZATION  01/11/2015   Procedure: Coronary Stent Intervention;  Surgeon: Marykay Lex, MD;  Location: [facility] INVASIVE CV LAB;  Service: Cardiovascular;; ost RCA 80%- cutting balloon PTCA-> DES PCI (2.75 x 12 Promus DES - 3.1 mm); pRCA (covering tandem 100% -7-%) DES PCI (Promus DES 2.5 x 24 --> 2.8 mm)   CARDIAC CATHETERIZATION  01/11/2015   Procedure: Coronary Balloon Angioplasty;   Surgeon: Marykay Lex, MD;  Location: [facility] INVASIVE CV LAB;  Service: Cardiovascular;;PTCA of dRCA 90% --> 10%.   CARDIAC CATHETERIZATION N/A 01/12/2015   Procedure: Left Heart Cath and Coronary Angiography;  Surgeon: Lyn Records, MD;  Location: [facility] INVASIVE CV LAB;  Service: Cardiovascular;; Relook Cath for recurrent CP post STEMI PCI --> difficult to engage ostial RCA stent (unable to wire) - plaque protrusion in pRCA stent --> Rx with Aggrastat infusion.   CARDIAC CATHETERIZATION N/A 11/10/2015   Procedure: Left Heart Cath and Coronary Angiography;  Surgeon: Lennette Bihari, MD;  Location: [facility] INVASIVE CV LAB;  Service: Cardiovascular; patent RCA stents. Ostial LM 30%, ostial LAD 40% - FFR negative. Moderate-severe LV dysfunction with hypo-kinesis involving the anterior wall to the apex. EF suggested 30%. (? Takotsubo)  CARDIAC CATHETERIZATION N/A 11/10/2015   Procedure: Intravascular Pressure Wire/FFR Study;  Surgeon: Lennette Bihari, MD;  Location: [facility] INVASIVE CV LAB;  Service: Cardiovascular;  FFR of LAD negative.   DILATION AND CURETTAGE OF UTERUS  1979   S/P miscarriage   INCISION AND DRAINAGE FOOT Right    "spider bite"   NOSE SURGERY  X 2   "to remove genetic bone spur"   TRANSTHORACIC ECHOCARDIOGRAM  11/11/2015   Normal LV size and function. Vigorous function with EF 6570%. GR 1 DD. Mild-moderate MR.   TRANSTHORACIC ECHOCARDIOGRAM  11/09/2022   EF 60 to 65%.  No RWMA AOV sclerosis with no stenosis.  Mean gradient 6.5.  Severe AI.  Normal RV size and function.  Normal PAP. (Images reviewed with Dr. Royann Shivers.  He believes that the pressure half-time does not correlate with severe AI even though the images appear to be relatively severe.  Likely moderate to severe.  LV size is normal as is EF.)    MEDICATIONS:  acetaminophen (TYLENOL) 500 MG tablet   ALPRAZolam (XANAX) 0.25 MG tablet   amLODipine (NORVASC) 10 MG tablet   aspirin EC 81 MG tablet   BIOTIN PO   CALCIUM PO   cephALEXin  (KEFLEX) 500 MG capsule   cholecalciferol (VITAMIN D) 1000 UNITS tablet   EPINEPHrine 0.3 mg/0.3 mL IJ SOAJ injection   furosemide (LASIX) 20 MG tablet   hydrOXYzine (ATARAX) 10 MG tablet   metoprolol succinate (TOPROL-XL) 25 MG 24 hr tablet   nitroGLYCERIN (NITROSTAT) 0.4 MG SL tablet   pantoprazole (PROTONIX) 40 MG tablet   rosuvastatin (CRESTOR) 40 MG tablet   triamcinolone cream (KENALOG) 0.1 %   No current facility-administered medications for this encounter.  Aspirin is currently on hold.  She is not currently taking Keflex.   Shonna Chock, PA-C Surgical Short Stay/Anesthesiology [facility] Phone [phone] [facility] Phone [phone] 06/29/2023 4:14 PM

## 2023-06-29 NOTE — Anesthesia Preprocedure Evaluation (Signed)
Anesthesia Evaluation  Patient identified by MRN, date of birth, ID band Patient awake    Reviewed: Allergy & Precautions, NPO status , Patient's Chart, lab work & pertinent test results, reviewed documented beta blocker date and time   History of Anesthesia Complications (+) PONV, PROLONGED EMERGENCE, PSEUDOCHOLINESTERASE DEFICIENCY and history of anesthetic complications  Airway Mallampati: III  TM Distance: >3 FB Neck ROM: Full    Dental no notable dental hx. (+) Dental Advisory Given   Pulmonary neg recent URI, former smoker   Pulmonary exam normal breath sounds clear to auscultation       Cardiovascular hypertension, Pt. on home beta blockers and Pt. on medications + angina  + CAD, + Past MI and + Peripheral Vascular Disease   Rhythm:Regular Rate:Normal + Systolic murmurs  Echo 11/09/2022: IMPRESSIONS  1. Severe aortic regurgitation.  2. Left ventricular ejection fraction, by estimation, is 60 to 65%. The left ventricle has normal function. The left ventricle has no regional wall motion abnormalities. Left ventricular diastolic parameters are consistent with Grade I diastolic  dysfunction (impaired relaxation).  3. Right ventricular systolic function is normal. The right ventricular size is normal.  4. The mitral valve is normal in structure. Mild mitral valve regurgitation. No evidence of mitral stenosis. Moderate mitral annular calcification.  5. The aortic valve is tricuspid. There is mild calcification of the aortic valve. Aortic valve regurgitation is severe. Aortic valve sclerosis is present, with no evidence of aortic valve stenosis. Aortic regurgitation PHT measures 539 msec. Aortic  valve mean gradient measures 6.5 mmHg. Aortic valve Vmax measures 1.73  m/s.  6. The inferior vena cava is normal in size with greater than 50% respiratory variability, suggesting right atrial pressure of 3 mmHg.    Neuro/Psych   Headaches PSYCHIATRIC DISORDERS Anxiety Depression     Neuromuscular disease    GI/Hepatic negative GI ROS, Neg liver ROS,,,  Endo/Other  negative endocrine ROS    Renal/GU Renal disease     Musculoskeletal negative musculoskeletal ROS (+)    Abdominal   Peds  Hematology negative hematology ROS (+)   Anesthesia Other Findings   Reproductive/Obstetrics                             Anesthesia Physical Anesthesia Plan  ASA: 4  Anesthesia Plan: General   Post-op Pain Management: Tylenol PO (pre-op)*   Induction: Intravenous  PONV Risk Score and Plan: 4 or greater and Ondansetron, Dexamethasone and Treatment may vary due to age or medical condition  Airway Management Planned: Oral ETT  Additional Equipment: Arterial line  Intra-op Plan:   Post-operative Plan: Extubation in OR  Informed Consent: I have reviewed the patients History and Physical, chart, labs and discussed the procedure including the risks, benefits and alternatives for the proposed anesthesia with the patient or authorized representative who has indicated his/her understanding and acceptance.     Dental advisory given  Plan Discussed with: CRNA  Anesthesia Plan Comments: (  See PAT note written 06/29/2023 by Shonna Chock, PA-C. History includes CAD, severe AI, pseudocholinesterase deficiency, alpha gal allergy.   )       Anesthesia Quick Evaluation

## 2023-07-02 DIAGNOSIS — T781XXD Other adverse food reactions, not elsewhere classified, subsequent encounter: Secondary | ICD-10-CM | POA: Diagnosis not present

## 2023-07-02 DIAGNOSIS — R21 Rash and other nonspecific skin eruption: Secondary | ICD-10-CM | POA: Diagnosis not present

## 2023-07-02 DIAGNOSIS — L299 Pruritus, unspecified: Secondary | ICD-10-CM | POA: Diagnosis not present

## 2023-07-02 DIAGNOSIS — J300 Vasomotor rhinitis: Secondary | ICD-10-CM | POA: Diagnosis not present

## 2023-07-03 ENCOUNTER — Other Ambulatory Visit: Payer: Self-pay

## 2023-07-03 ENCOUNTER — Encounter (HOSPITAL_COMMUNITY): Payer: Self-pay | Admitting: General Surgery

## 2023-07-03 ENCOUNTER — Ambulatory Visit (HOSPITAL_COMMUNITY)
Admission: RE | Admit: 2023-07-03 | Discharge: 2023-07-03 | Disposition: A | Discharge status: Home / Self Care | Payer: Medicare Other | Source: Ambulatory Visit | Attending: General Surgery | Admitting: General Surgery

## 2023-07-03 ENCOUNTER — Ambulatory Visit (HOSPITAL_COMMUNITY): Payer: Self-pay | Admitting: Vascular Surgery

## 2023-07-03 ENCOUNTER — Encounter (HOSPITAL_COMMUNITY)
Admission: RE | Disposition: A | Discharge status: Home / Self Care | Payer: Self-pay | Source: Ambulatory Visit | Attending: General Surgery

## 2023-07-03 ENCOUNTER — Ambulatory Visit (HOSPITAL_BASED_OUTPATIENT_CLINIC_OR_DEPARTMENT_OTHER): Payer: Medicare Other | Admitting: Certified Registered"

## 2023-07-03 DIAGNOSIS — Z87891 Personal history of nicotine dependence: Secondary | ICD-10-CM | POA: Diagnosis not present

## 2023-07-03 DIAGNOSIS — I25119 Atherosclerotic heart disease of native coronary artery with unspecified angina pectoris: Secondary | ICD-10-CM | POA: Insufficient documentation

## 2023-07-03 DIAGNOSIS — I10 Essential (primary) hypertension: Secondary | ICD-10-CM | POA: Diagnosis not present

## 2023-07-03 DIAGNOSIS — K828 Other specified diseases of gallbladder: Secondary | ICD-10-CM

## 2023-07-03 DIAGNOSIS — F418 Other specified anxiety disorders: Secondary | ICD-10-CM | POA: Diagnosis not present

## 2023-07-03 DIAGNOSIS — Z9049 Acquired absence of other specified parts of digestive tract: Secondary | ICD-10-CM | POA: Diagnosis not present

## 2023-07-03 DIAGNOSIS — I2510 Atherosclerotic heart disease of native coronary artery without angina pectoris: Secondary | ICD-10-CM | POA: Diagnosis not present

## 2023-07-03 DIAGNOSIS — I252 Old myocardial infarction: Secondary | ICD-10-CM | POA: Insufficient documentation

## 2023-07-03 DIAGNOSIS — K8010 Calculus of gallbladder with chronic cholecystitis without obstruction: Secondary | ICD-10-CM | POA: Diagnosis not present

## 2023-07-03 DIAGNOSIS — I739 Peripheral vascular disease, unspecified: Secondary | ICD-10-CM | POA: Insufficient documentation

## 2023-07-03 DIAGNOSIS — K811 Chronic cholecystitis: Secondary | ICD-10-CM | POA: Diagnosis not present

## 2023-07-03 DIAGNOSIS — Z955 Presence of coronary angioplasty implant and graft: Secondary | ICD-10-CM | POA: Diagnosis not present

## 2023-07-03 DIAGNOSIS — D135 Benign neoplasm of extrahepatic bile ducts: Secondary | ICD-10-CM | POA: Diagnosis not present

## 2023-07-03 DIAGNOSIS — K829 Disease of gallbladder, unspecified: Secondary | ICD-10-CM | POA: Diagnosis not present

## 2023-07-03 HISTORY — PX: CHOLECYSTECTOMY: SHX55

## 2023-07-03 SURGERY — LAPAROSCOPIC CHOLECYSTECTOMY
Anesthesia: General | Site: Abdomen

## 2023-07-03 MED ORDER — ORAL CARE MOUTH RINSE: 15.0000 mL | Freq: Once | OROMUCOSAL | Status: DC

## 2023-07-03 MED ORDER — PROPOFOL 10 MG/ML IV BOLUS
INTRAVENOUS | Status: AC
  Filled 2023-07-03: qty 20

## 2023-07-03 MED ORDER — LACTATED RINGERS IV SOLN: INTRAVENOUS | Status: DC

## 2023-07-03 MED ORDER — SUGAMMADEX SODIUM 200 MG/2ML IV SOLN
INTRAVENOUS | Status: DC | PRN
  Administered 2023-07-03: 150 mg via INTRAVENOUS
  Administered 2023-07-03: 50 mg via INTRAVENOUS

## 2023-07-03 MED ORDER — DEXAMETHASONE SODIUM PHOSPHATE 10 MG/ML IJ SOLN
INTRAMUSCULAR | Status: AC
  Filled 2023-07-03: qty 1

## 2023-07-03 MED ORDER — DEXAMETHASONE SODIUM PHOSPHATE 10 MG/ML IJ SOLN
INTRAMUSCULAR | Status: DC | PRN
  Administered 2023-07-03: 10 mg via INTRAVENOUS

## 2023-07-03 MED ORDER — LIDOCAINE 2% (20 MG/ML) 5 ML SYRINGE
INTRAMUSCULAR | Status: AC
Start: 2023-07-03 — End: ?
  Filled 2023-07-03: qty 5

## 2023-07-03 MED ORDER — FENTANYL CITRATE (PF) 100 MCG/2ML IJ SOLN
INTRAMUSCULAR | Status: AC
  Filled 2023-07-03: qty 2

## 2023-07-03 MED ORDER — PHENYLEPHRINE 80 MCG/ML (10ML) SYRINGE FOR IV PUSH (FOR BLOOD PRESSURE SUPPORT)
PREFILLED_SYRINGE | INTRAVENOUS | Status: AC
  Filled 2023-07-03: qty 10

## 2023-07-03 MED ORDER — DIPHENHYDRAMINE HCL 50 MG/ML IJ SOLN
INTRAMUSCULAR | Status: DC | PRN
Start: 2023-07-03 — End: 2023-07-03
  Administered 2023-07-03: 25 mg via INTRAVENOUS

## 2023-07-03 MED ORDER — 0.9 % SODIUM CHLORIDE (POUR BTL) OPTIME
TOPICAL | Status: DC | PRN
  Administered 2023-07-03: 1000 mL

## 2023-07-03 MED ORDER — MIDAZOLAM HCL 2 MG/2ML IJ SOLN
INTRAMUSCULAR | Status: DC | PRN
  Administered 2023-07-03: 2 mg via INTRAVENOUS

## 2023-07-03 MED ORDER — ONDANSETRON HCL 4 MG/2ML IJ SOLN
INTRAMUSCULAR | Status: AC
  Filled 2023-07-03: qty 2

## 2023-07-03 MED ORDER — SODIUM CHLORIDE 0.9 % IR SOLN
Status: DC | PRN
  Administered 2023-07-03: 1000 mL

## 2023-07-03 MED ORDER — GLYCOPYRROLATE PF 0.2 MG/ML IJ SOSY
PREFILLED_SYRINGE | INTRAMUSCULAR | Status: AC
  Filled 2023-07-03: qty 1

## 2023-07-03 MED ORDER — ROCURONIUM BROMIDE 10 MG/ML (PF) SYRINGE
PREFILLED_SYRINGE | INTRAVENOUS | Status: DC | PRN
  Administered 2023-07-03: 60 mg via INTRAVENOUS

## 2023-07-03 MED ORDER — FENTANYL CITRATE (PF) 250 MCG/5ML IJ SOLN
INTRAMUSCULAR | Status: DC | PRN
  Administered 2023-07-03 (×4): 50 ug via INTRAVENOUS

## 2023-07-03 MED ORDER — DIPHENHYDRAMINE HCL 50 MG/ML IJ SOLN
INTRAMUSCULAR | Status: AC
  Filled 2023-07-03: qty 1

## 2023-07-03 MED ORDER — CEFAZOLIN SODIUM-DEXTROSE 2-4 GM/100ML-% IV SOLN
2.0000 g | INTRAVENOUS | Status: AC
  Administered 2023-07-03: 2 g via INTRAVENOUS
  Filled 2023-07-03: qty 100

## 2023-07-03 MED ORDER — BUPIVACAINE-EPINEPHRINE (PF) 0.25% -1:200000 IJ SOLN
INTRAMUSCULAR | Status: AC
  Filled 2023-07-03: qty 30

## 2023-07-03 MED ORDER — PROPOFOL 10 MG/ML IV BOLUS
INTRAVENOUS | Status: DC | PRN
  Administered 2023-07-03: 100 mg via INTRAVENOUS

## 2023-07-03 MED ORDER — ONDANSETRON HCL 4 MG/2ML IJ SOLN
INTRAMUSCULAR | Status: DC | PRN
  Administered 2023-07-03: 4 mg via INTRAVENOUS

## 2023-07-03 MED ORDER — DROPERIDOL 2.5 MG/ML IJ SOLN
0.6250 mg | Freq: Once | INTRAMUSCULAR | Status: DC | PRN
Start: 2023-07-03 — End: 2023-07-03

## 2023-07-03 MED ORDER — CHLORHEXIDINE GLUCONATE 0.12 % MT SOLN: 15.0000 mL | Freq: Once | OROMUCOSAL | Status: DC

## 2023-07-03 MED ORDER — FENTANYL CITRATE (PF) 100 MCG/2ML IJ SOLN
25.0000 ug | INTRAMUSCULAR | Status: DC | PRN
  Administered 2023-07-03: 25 ug via INTRAVENOUS

## 2023-07-03 MED ORDER — BUPIVACAINE HCL (PF) 0.25 % IJ SOLN
INTRAMUSCULAR | Status: AC
  Filled 2023-07-03: qty 30

## 2023-07-03 MED ORDER — MIDAZOLAM HCL 2 MG/2ML IJ SOLN
INTRAMUSCULAR | Status: AC
Start: 2023-07-03 — End: ?
  Filled 2023-07-03: qty 2

## 2023-07-03 MED ORDER — EPHEDRINE SULFATE-NACL 50-0.9 MG/10ML-% IV SOSY
PREFILLED_SYRINGE | INTRAVENOUS | Status: DC | PRN
  Administered 2023-07-03 (×3): 5 mg via INTRAVENOUS

## 2023-07-03 MED ORDER — LIDOCAINE 2% (20 MG/ML) 5 ML SYRINGE
INTRAMUSCULAR | Status: DC | PRN
  Administered 2023-07-03: 80 mg via INTRAVENOUS

## 2023-07-03 MED ORDER — TRAMADOL HCL 50 MG PO TABS: 50.0000 mg | ORAL_TABLET | Freq: Four times a day (QID) | ORAL | 0 refills | Status: DC | PRN

## 2023-07-03 MED ORDER — FENTANYL CITRATE (PF) 250 MCG/5ML IJ SOLN
INTRAMUSCULAR | Status: AC
  Filled 2023-07-03: qty 5

## 2023-07-03 MED ORDER — EPHEDRINE 5 MG/ML INJ
INTRAVENOUS | Status: AC
  Filled 2023-07-03: qty 5

## 2023-07-03 MED ORDER — CHLORHEXIDINE GLUCONATE CLOTH 2 % EX PADS: 6.0000 | MEDICATED_PAD | Freq: Once | CUTANEOUS | Status: DC

## 2023-07-03 MED ORDER — ROCURONIUM BROMIDE 10 MG/ML (PF) SYRINGE
PREFILLED_SYRINGE | INTRAVENOUS | Status: AC
  Filled 2023-07-03: qty 10

## 2023-07-03 MED ORDER — BUPIVACAINE HCL 0.25 % IJ SOLN
INTRAMUSCULAR | Status: DC | PRN
  Administered 2023-07-03: 8 mL

## 2023-07-03 MED ORDER — ACETAMINOPHEN 500 MG PO TABS
1000.0000 mg | ORAL_TABLET | ORAL | Status: AC
  Administered 2023-07-03: 1000 mg via ORAL
  Filled 2023-07-03: qty 2

## 2023-07-03 MED ORDER — KETOROLAC TROMETHAMINE 30 MG/ML IJ SOLN
INTRAMUSCULAR | Status: AC
  Filled 2023-07-03: qty 1

## 2023-07-03 MED ORDER — ENSURE PRE-SURGERY PO LIQD: 296.0000 mL | Freq: Once | ORAL | Status: DC

## 2023-07-03 SURGICAL SUPPLY — 37 items
BAG COUNTER SPONGE SURGICOUNT (BAG) ×1 IMPLANT
CANISTER SUCT 3000ML PPV (MISCELLANEOUS) ×1 IMPLANT
CHLORAPREP W/TINT 26 (MISCELLANEOUS) ×1 IMPLANT
CLIP LIGATING HEMO O LOK GREEN (MISCELLANEOUS) ×1 IMPLANT
COVER SURGICAL LIGHT HANDLE (MISCELLANEOUS) ×1 IMPLANT
COVER TRANSDUCER ULTRASND (DRAPES) ×1 IMPLANT
DERMABOND ADVANCED .7 DNX12 (GAUZE/BANDAGES/DRESSINGS) ×1 IMPLANT
DRSG IV TEGADERM 3.5X4.5 STRL (GAUZE/BANDAGES/DRESSINGS) IMPLANT
ELECT REM PT RETURN 9FT ADLT (ELECTROSURGICAL) ×1
ELECTRODE REM PT RTRN 9FT ADLT (ELECTROSURGICAL) ×1 IMPLANT
GAUZE SPONGE 4X4 12PLY STRL (GAUZE/BANDAGES/DRESSINGS) IMPLANT
GLOVE SURG SYN 7.5 E (GLOVE) ×1
GLOVE SURG SYN 7.5 PF PI (GLOVE) IMPLANT
GOWN STRL REUS W/ TWL LRG LVL3 (GOWN DISPOSABLE) ×2 IMPLANT
GOWN STRL REUS W/ TWL XL LVL3 (GOWN DISPOSABLE) ×1 IMPLANT
GRASPER SUT TROCAR 14GX15 (MISCELLANEOUS) ×1 IMPLANT
IRRIG SUCT STRYKERFLOW 2 WTIP (MISCELLANEOUS) ×1
IRRIGATION SUCT STRKRFLW 2 WTP (MISCELLANEOUS) ×1 IMPLANT
KIT BASIN OR (CUSTOM PROCEDURE TRAY) ×1 IMPLANT
KIT TURNOVER KIT B (KITS) ×1 IMPLANT
NDL INSUFFLATION 14GA 120MM (NEEDLE) ×1 IMPLANT
NEEDLE INSUFFLATION 14GA 120MM (NEEDLE) ×1
NS IRRIG 1000ML POUR BTL (IV SOLUTION) ×1 IMPLANT
PAD ARMBOARD 7.5X6 YLW CONV (MISCELLANEOUS) ×1 IMPLANT
POUCH LAPAROSCOPIC INSTRUMENT (MISCELLANEOUS) ×1 IMPLANT
POUCH RETRIEVAL ECOSAC 10 (ENDOMECHANICALS) IMPLANT
SCISSORS LAP 5X35 DISP (ENDOMECHANICALS) ×1 IMPLANT
SET TUBE SMOKE EVAC HIGH FLOW (TUBING) ×1 IMPLANT
SLEEVE Z-THREAD 5X100MM (TROCAR) ×1 IMPLANT
SPECIMEN JAR SMALL (MISCELLANEOUS) ×1 IMPLANT
SUT MNCRL AB 4-0 PS2 18 (SUTURE) ×1 IMPLANT
TOWEL GREEN STERILE (TOWEL DISPOSABLE) ×1 IMPLANT
TRAY LAPAROSCOPIC MC (CUSTOM PROCEDURE TRAY) ×1 IMPLANT
TROCAR 11X100 Z THREAD (TROCAR) ×1 IMPLANT
TROCAR Z-THREAD OPTICAL 5X100M (TROCAR) ×1 IMPLANT
WARMER LAPAROSCOPE (MISCELLANEOUS) ×1 IMPLANT
WATER STERILE IRR 1000ML POUR (IV SOLUTION) ×1 IMPLANT

## 2023-07-03 NOTE — Op Note (Signed)
07/03/2023  8:15 AM  PATIENT:  Laura Herrera  72 y.o. female  PRE-OPERATIVE DIAGNOSIS:  GALLBLADDER MASS  POST-OPERATIVE DIAGNOSIS:  GALLBLADDER MASS  PROCEDURE:  Procedure(s): LAPAROSCOPIC CHOLECYSTECTOMY (N/A)  SURGEON:  Surgeons and Role:    Axel Filler, MD - Primary  ASSISTANTS: Rockwell Germany, RNFA   ANESTHESIA:   local and general  EBL:  5 mL   BLOOD ADMINISTERED:none  DRAINS: none   LOCAL MEDICATIONS USED:  BUPIVICAINE   SPECIMEN:  Source of Specimen:  gallbladder  DISPOSITION OF SPECIMEN:  PATHOLOGY  COUNTS:  YES  TOURNIQUET:  * No tourniquets in log *  DICTATION: .Dragon Dictation  The patient was taken to the operating and placed in the supine position with bilateral SCDs in place.  The patient was prepped and draped in the usual sterile fashion. A time out was called and all facts were verified. A pneumoperitoneum was obtained via A Veress needle technique to a pressure of 14mm of mercury.  A 5mm trochar was then placed in the right upper quadrant under visualization, and there were no injuries to any abdominal organs. A 11 mm port was then placed in the umbilical region after infiltrating with local anesthesia under direct visualization. A second and third epigastric port and right lower quadrant port placement under direct visualization, respectively.    The gallbladder was identified and retracted, the peritoneum was then sharply dissected from the gallbladder and this dissection was carried down to Calot's triangle. The cystic duct was identified and stripped away circumferentially and seen going into the gallbladder 360, the critical angle was obtained.  2 clips were placed proximally one distally and the cystic duct transected. The cystic artery was identified and 2 clips placed proximally and one distally and transected.  We then proceeded to remove the gallbladder off the hepatic fossa with Bovie cautery. A retrieval bag was then placed in the  abdomen and gallbladder placed in the bag. The hepatic fossa was then reexamined and hemostasis was achieved with Bovie cautery and was excellent at the end of the case.   The subhepatic fossa and perihepatic fossa was then irrigated until the effluent was clear.  The gallbladder and bag were removed from the abdominal cavity. The 11 mm trocar fascia was reapproximated with the Endo Close #1 Vicryl x2.  The pneumoperitoneum was evacuated and all trochars removed under direct visulalization.  The skin was then closed with 4-0 Monocryl and the skin dressed with Dermabond.    The patient was awaken from general anesthesia and taken to the recovery room in stable condition.   PLAN OF CARE: Discharge to home after PACU  PATIENT DISPOSITION:  PACU - hemodynamically stable.   Delay start of Pharmacological VTE agent (>24hrs) due to surgical blood loss or risk of bleeding: not applicable

## 2023-07-03 NOTE — Anesthesia Procedure Notes (Signed)
Arterial Line Insertion Start/End11/26/2024 7:15 AM, 07/03/2023 7:25 AM Performed by: Lewie Loron, MD, Alwyn Ren, CRNA, CRNA  Patient location: Pre-op. Preanesthetic checklist: patient identified, IV checked, site marked, risks and benefits discussed, surgical consent, monitors and equipment checked, pre-op evaluation, timeout performed and anesthesia consent Lidocaine 1% used for infiltration Right, radial was placed Catheter size: 20 G Hand hygiene performed  and maximum sterile barriers used   Attempts: 3 Procedure performed without using ultrasound guided technique. Following insertion, dressing applied. Post procedure assessment: normal and unchanged  Patient tolerated the procedure with difficulty.

## 2023-07-03 NOTE — Anesthesia Postprocedure Evaluation (Signed)
Anesthesia Post Note  Patient: Laura Herrera  Procedure(s) Performed: LAPAROSCOPIC CHOLECYSTECTOMY (Abdomen)     Patient location during evaluation: PACU Anesthesia Type: General Level of consciousness: sedated and patient cooperative Pain management: pain level controlled Vital Signs Assessment: post-procedure vital signs reviewed and stable Respiratory status: spontaneous breathing Cardiovascular status: stable Anesthetic complications: no   No notable events documented.  Last Vitals:  Vitals:   07/03/23 0930 07/03/23 0936  BP: (!) 106/38 (!) 105/42  Pulse: (!) 50 (!) 46  Resp: 11 12  Temp:  36.5 C  SpO2: 100% 99%    Last Pain:  Vitals:   07/03/23 0836  TempSrc:   PainSc: 0-No pain                 Lewie Loron

## 2023-07-03 NOTE — Transfer of Care (Signed)
Immediate Anesthesia Transfer of Care Note  Patient: Laura Herrera  Procedure(s) Performed: LAPAROSCOPIC CHOLECYSTECTOMY (Abdomen)  Patient Location: PACU  Anesthesia Type:General  Level of Consciousness: awake and oriented  Airway & Oxygen Therapy: Patient Spontanous Breathing and Patient connected to face mask oxygen  Post-op Assessment: Report given to RN and Post -op Vital signs reviewed and stable  Post vital signs: Reviewed and stable  Last Vitals:  Vitals Value Taken Time  BP 118/46 07/03/23 0836  Temp    Pulse 57 07/03/23 0839  Resp 12 07/03/23 0839  SpO2 93 % 07/03/23 0839  Vitals shown include unfiled device data.  Last Pain:  Vitals:   07/03/23 0628  TempSrc:   PainSc: 0-No pain      Patients Stated Pain Goal: 0 (07/03/23 1610)  Complications: No notable events documented.

## 2023-07-03 NOTE — Discharge Instructions (Signed)

## 2023-07-03 NOTE — Anesthesia Procedure Notes (Signed)
Procedure Name: Intubation Date/Time: 07/03/2023 7:42 AM  Performed by: Alwyn Ren, CRNAPre-anesthesia Checklist: Patient identified, Emergency Drugs available, Suction available and Patient being monitored Patient Re-evaluated:Patient Re-evaluated prior to induction Oxygen Delivery Method: Circle system utilized Preoxygenation: Pre-oxygenation with 100% oxygen Induction Type: IV induction Ventilation: Mask ventilation without difficulty Laryngoscope Size: Miller and 2 Grade View: Grade II Tube type: Oral Tube size: 7.0 mm Number of attempts: 1 Airway Equipment and Method: Stylet and Oral airway Placement Confirmation: ETT inserted through vocal cords under direct vision, positive ETCO2 and breath sounds checked- equal and bilateral Secured at: 22 cm Tube secured with: Tape Dental Injury: Teeth and Oropharynx as per pre-operative assessment

## 2023-07-03 NOTE — Interval H&P Note (Signed)
History and Physical Interval Note:  07/03/2023 6:50 AM  Laura Herrera  has presented today for surgery, with the diagnosis of GALLBLADDER MASS.  The various methods of treatment have been discussed with the patient and family. After consideration of risks, benefits and other options for treatment, the patient has consented to  Procedure(s): LAPAROSCOPIC CHOLECYSTECTOMY (N/A) as a surgical intervention.  The patient's history has been reviewed, patient examined, no change in status, stable for surgery.  I have reviewed the patient's chart and labs.  Questions were answered to the patient's satisfaction.     Axel Filler

## 2023-07-04 ENCOUNTER — Encounter (HOSPITAL_COMMUNITY): Payer: Self-pay | Admitting: General Surgery

## 2023-07-04 LAB — SURGICAL PATHOLOGY

## 2023-08-06 DIAGNOSIS — Z91014 Allergy to mammalian meats: Secondary | ICD-10-CM | POA: Diagnosis not present

## 2023-08-30 DIAGNOSIS — Z1331 Encounter for screening for depression: Secondary | ICD-10-CM | POA: Diagnosis not present

## 2023-08-30 DIAGNOSIS — Z01419 Encounter for gynecological examination (general) (routine) without abnormal findings: Secondary | ICD-10-CM | POA: Diagnosis not present

## 2023-08-30 DIAGNOSIS — Z01411 Encounter for gynecological examination (general) (routine) with abnormal findings: Secondary | ICD-10-CM | POA: Diagnosis not present

## 2023-08-30 DIAGNOSIS — Z113 Encounter for screening for infections with a predominantly sexual mode of transmission: Secondary | ICD-10-CM | POA: Diagnosis not present

## 2023-08-30 DIAGNOSIS — Z124 Encounter for screening for malignant neoplasm of cervix: Secondary | ICD-10-CM | POA: Diagnosis not present

## 2023-09-10 ENCOUNTER — Telehealth: Payer: Self-pay | Admitting: Cardiology

## 2023-09-10 NOTE — Telephone Encounter (Signed)
Pt called in asking about pt a bill from 11/09/22. She asked to speak with billing about this issue. Please advise.

## 2023-09-13 ENCOUNTER — Other Ambulatory Visit: Payer: Self-pay | Admitting: Cardiology

## 2023-09-18 DIAGNOSIS — L308 Other specified dermatitis: Secondary | ICD-10-CM | POA: Diagnosis not present

## 2023-09-18 DIAGNOSIS — D1730 Benign lipomatous neoplasm of skin and subcutaneous tissue of unspecified sites: Secondary | ICD-10-CM | POA: Diagnosis not present

## 2023-09-18 DIAGNOSIS — B028 Zoster with other complications: Secondary | ICD-10-CM | POA: Diagnosis not present

## 2023-09-24 ENCOUNTER — Other Ambulatory Visit: Payer: Self-pay | Admitting: Family Medicine

## 2023-09-24 DIAGNOSIS — E2839 Other primary ovarian failure: Secondary | ICD-10-CM

## 2023-09-27 ENCOUNTER — Other Ambulatory Visit: Payer: Medicare Other

## 2023-10-22 ENCOUNTER — Other Ambulatory Visit: Payer: Self-pay | Admitting: Family Medicine

## 2023-10-22 DIAGNOSIS — Z1231 Encounter for screening mammogram for malignant neoplasm of breast: Secondary | ICD-10-CM

## 2023-11-02 DIAGNOSIS — T781XXA Other adverse food reactions, not elsewhere classified, initial encounter: Secondary | ICD-10-CM | POA: Diagnosis not present

## 2023-11-27 ENCOUNTER — Ambulatory Visit (HOSPITAL_COMMUNITY): Payer: Medicare Other | Attending: Cardiovascular Disease

## 2023-11-27 DIAGNOSIS — I351 Nonrheumatic aortic (valve) insufficiency: Secondary | ICD-10-CM | POA: Diagnosis not present

## 2023-11-27 DIAGNOSIS — I2119 ST elevation (STEMI) myocardial infarction involving other coronary artery of inferior wall: Secondary | ICD-10-CM | POA: Diagnosis not present

## 2023-11-27 DIAGNOSIS — R002 Palpitations: Secondary | ICD-10-CM | POA: Insufficient documentation

## 2023-11-27 DIAGNOSIS — I25111 Atherosclerotic heart disease of native coronary artery with angina pectoris with documented spasm: Secondary | ICD-10-CM | POA: Diagnosis not present

## 2023-11-27 DIAGNOSIS — I2510 Atherosclerotic heart disease of native coronary artery without angina pectoris: Secondary | ICD-10-CM | POA: Insufficient documentation

## 2023-11-27 DIAGNOSIS — I358 Other nonrheumatic aortic valve disorders: Secondary | ICD-10-CM | POA: Insufficient documentation

## 2023-11-27 DIAGNOSIS — I771 Stricture of artery: Secondary | ICD-10-CM | POA: Insufficient documentation

## 2023-11-27 DIAGNOSIS — Z9861 Coronary angioplasty status: Secondary | ICD-10-CM | POA: Insufficient documentation

## 2023-11-27 LAB — ECHOCARDIOGRAM COMPLETE
Area-P 1/2: 2.93 cm2
P 1/2 time: 421 ms
S' Lateral: 3.1 cm

## 2023-11-28 ENCOUNTER — Ambulatory Visit: Payer: Medicare Other | Attending: Cardiology | Admitting: Cardiology

## 2023-11-28 ENCOUNTER — Encounter: Payer: Self-pay | Admitting: Cardiology

## 2023-11-28 VITALS — BP 110/57 | HR 60 | Ht 68.0 in | Wt 159.0 lb

## 2023-11-28 DIAGNOSIS — I10 Essential (primary) hypertension: Secondary | ICD-10-CM | POA: Diagnosis not present

## 2023-11-28 DIAGNOSIS — E7800 Pure hypercholesterolemia, unspecified: Secondary | ICD-10-CM

## 2023-11-28 DIAGNOSIS — I25111 Atherosclerotic heart disease of native coronary artery with angina pectoris with documented spasm: Secondary | ICD-10-CM | POA: Diagnosis not present

## 2023-11-28 DIAGNOSIS — I2119 ST elevation (STEMI) myocardial infarction involving other coronary artery of inferior wall: Secondary | ICD-10-CM | POA: Diagnosis not present

## 2023-11-28 DIAGNOSIS — E785 Hyperlipidemia, unspecified: Secondary | ICD-10-CM | POA: Diagnosis not present

## 2023-11-28 DIAGNOSIS — I771 Stricture of artery: Secondary | ICD-10-CM

## 2023-11-28 DIAGNOSIS — I2510 Atherosclerotic heart disease of native coronary artery without angina pectoris: Secondary | ICD-10-CM

## 2023-11-28 DIAGNOSIS — I351 Nonrheumatic aortic (valve) insufficiency: Secondary | ICD-10-CM

## 2023-11-28 DIAGNOSIS — Z91018 Allergy to other foods: Secondary | ICD-10-CM

## 2023-11-28 DIAGNOSIS — R002 Palpitations: Secondary | ICD-10-CM

## 2023-11-28 DIAGNOSIS — Z9861 Coronary angioplasty status: Secondary | ICD-10-CM

## 2023-11-28 NOTE — Patient Instructions (Addendum)
 Medication Instructions:    *If you need a refill on your cardiac medications before your next appointment, please call your pharmacy*   Lab Work: fasting  Lipid  hepatic If you have labs (blood work) drawn today and your tests are completely normal, you will receive your results only by: MyChart Message (if you have MyChart) OR A paper copy in the mail If you have any lab test that is abnormal or we need to change your treatment, we will call you to review the results.   Testing/Procedures: Will be schedule at [address] march/April 2026 Your physician has requested that you have an echocardiogram. Echocardiography is a painless test that uses sound waves to create images of your heart. It provides your doctor with information about the size and shape of your heart and how well your heart's chambers and valves are working. This procedure takes approximately one hour. There are no restrictions for this procedure. Please do NOT wear cologne, perfume, aftershave, or lotions (deodorant is allowed). Please arrive 15 minutes prior to your appointment time.  Please note: We ask at that you not bring children with you during ultrasound (echo/ vascular) testing. Due to room size and safety concerns, children are not allowed in the ultrasound rooms during exams. Our front office staff cannot provide observation of children in our lobby area while testing is being conducted. An adult accompanying a patient to their appointment will only be allowed in the ultrasound room at the discretion of the ultrasound technician under special circumstances. We apologize for any inconvenience.    Follow-Up: At [facility], you and your health needs are our priority.  As part of our continuing mission to provide you with exceptional heart care, we have created designated Provider Care Teams.  These Care Teams include your primary Cardiologist (physician) and Advanced Practice Providers (APPs -   Physician Assistants and Nurse Practitioners) who all work together to provide you with the care you need, when you need it.     Your next appointment:   12 month(s) appt after Echo   The format for your next appointment:   In Person  Provider:   Randene Bustard, MD    Other Instructions

## 2023-11-28 NOTE — Progress Notes (Signed)
 Cardiology Office Note:  .   Date:  12/02/2023  ID:  Laura Herrera, DOB [birth year], MRN [id number] PCP: Lanae Pinal, MD  [facility] Providers Cardiologist:  Randene Bustard, MD     Referring Provider: Lanae Pinal, MD  Chief Complaint  Patient presents with   Follow-up    11-month follow-up-doing well.     Coronary Artery Disease    No active angina   Cardiac Valve Problem    Echo done yesterday for aortic insufficiency.    Patient Profile: .     Laura Herrera is a  73 y.o. female  with a PMH noted below who presents here for ~ 6 month f/u for CAD. CAD-PCI to RCA x2 09/26/2014 Takotsubo cardiomyopathy-resolved Aortic Insufficiency-suggested to be moderate-severe on previous Echo but most recent Echo showed moderate HTN; HLD Right subclavian stenosis - not noted Dopplers in May 2024     Laura Herrera was last seen on May 24, 2023 by Friddie Jetty, NP having been seen by Lawana Pray in June 2024 (at that time was noting swelling in the left lower leg.  Echocardiogram in April as noted "severe AI ".  Plan was to reassess in April of this year.  She underwent Laparoscopic Cholecystectomy in November 2024 -> with postop Shingles.  Subjective  Discussed the use of AI scribe software for clinical note transcription with the patient, who gave verbal consent to proceed.  History of Present Illness History of Present Illness Laura Herrera is a 73 year old female with heart disease who presents for follow-up regarding her echocardiogram results.  She had an echocardiogram done yesterday due to her history of aortic insufficiency noted on previous echoes.    She has been relatively asymptomatic from a car standpoint.  She denies any chest pain, pressure, or tightness. Occasional minor heart palpitations that do not concern her. She notes a reduction in dizziness when bending over, which she attributes to a reduction in her blood pressure medication  dosage.  No heart failure symptoms of PND orthopnea or edema.  No syncope/near syncope or TIA/amaurosis fugax.  No claudication.  She is currently taking Crestor  40 mg, amlodipine  10 mg, metoprolol  12.5 mg, and furosemide  as needed, though she reports not needing furosemide  recently. She also takes pantoprazole  for a hiatal hernia, despite not experiencing heartburn, and uses Xanax  for sleep.  She had her gallbladder removed before Thanksgiving due to it being misshapen, although she had no symptoms. Post-surgery, she experienced a mild case of shingles after her gallbladder surgery, with a light rash. Additionally, her alpha-gal allergy (possibly caused by tick bite which led to her restricting mammal derived products), which she had for two and a half years, is now resolved.  She thinks that the force dietary changes notably helped improve her cholesterol levels. She therefore follows a mostly plant-based diet..    She quit drinking alcohol in November following her diagnosis of gallbladder disease.  She thinks that this is notably improved her energy levels. She tries to stay active and exercises regularly.    Objective   Medications - Crestor /rosuvastatin  40 mg daily - Amlodipine  10 mg daily; - Toprol  XL 12.5 mg daily (1/2  of 25 mg tablet) - Furosemide  20 mg as needed (not currently using any - Pantoprazole  40 mg daily - Xanax  0.5 mg nightly  Studies Reviewed: Aaron Aas   EKG Interpretation Date/Time:  Wednesday November 28 2023 15:27:10 EDT Ventricular Rate:  60 PR Interval:  166 QRS Duration:  76 QT Interval:  434 QTC Calculation: 434 R Axis:   -4  Text Interpretation: Normal sinus rhythm Low voltage QRS Cannot rule out Anterior infarct , age undetermined When compared with ECG of 24-May-2023 15:42, No significant change was found Confirmed by Randene Bustard ([id number]) on 11/28/2023 3:45:00 PM    Labs  03/20/2023: TC 193, TG 120, HDL 90, LDL 82.  A1c 5.7. 06/28/2023: Hgb 13.6; Ce 1.45, K+  2.  ECHO 11/27/2023: EF 60 to 65%.  GR 1 DD.  Elevated LAP.  Normal RAP and RVP.  Mild AoV calcification with moderate AI.  No MS.  Moderate MAC with trivial MR.  No significant change.  AI is NOT Severe. Carotid Dopplers Dec 22, 2022: Bilateral ICA 139%.  Bilateral ECA> 50%.  Bilateral vertebral arteries with antegrade flow and normal flow hemodynamics in bilateral subclavian arteries noted.  Cath 11/10/2015:  OstLM 30% - Ost LAD 40% (FFR Neg). LVEF ~30% - Severe HK of Anterior-Anterolateral wall -> apex. (Takotsubo)   Risk Assessment/Calculations:        Physical Exam:   VS:  BP (!) 110/57   Pulse 60   Ht 5\' 8"  (1.727 m)   Wt 159 lb (72.1 kg)   SpO2 98%   BMI 24.18 kg/m    Wt Readings from Last 3 Encounters:  11/28/23 159 lb (72.1 kg)  07/03/23 160 lb (72.6 kg)  06/28/23 160 lb 14.4 oz (73 kg)    GEN: Healthy-appearing.  Well nourished, well groomed; in no acute distress;  NECK: No JVD; No carotid bruits-radiated AS murmur. CARDIAC:  RRR,Normal S1, S2; harsh 2/6 SEM at RUSB-neck, but unable to assess diastolic murmur.  No additional rubs, gallops RESPIRATORY:  Clear to auscultation without rales, wheezing or rhonchi ; nonlabored, good air movement. ABDOMEN: Soft, non-tender, non-distended EXTREMITIES:  No edema; No deformity     ASSESSMENT AND PLAN: .    Problem List Items Addressed This Visit       Cardiology Problems   Aortic valve insufficiency (Chronic)   Thankfully, echo yesterday showed only moderate regurgitation with mild calcification.  Also she remains essentially asymptomatic-with no CHF symptoms..  No surgical intervention needed currently. - Continue annual echocardiogram to monitor regurgitation. - Maintain blood pressure control. - Educated on heart failure symptoms to report.      Relevant Orders   ECHOCARDIOGRAM COMPLETE   CAD S/P PCI of the ostial and mid/distal RCA (Chronic)   Multiple stents in the RCA in February 2016 they are patent by cath in  April 2017. -On ASA 81 mg monotherapy.  (Converted from Plavix  in 2024) Okay to hold aspirin  5 to 7 days preop for surgeries or procedures.      Relevant Orders   Hepatic function panel   Lipid panel   ECHOCARDIOGRAM COMPLETE   Coronary artery disease with angina pectoris with documented spasm (HCC) - Primary (Chronic)   Asymptomatic post-myocardial infarction with well-controlled hypertension.  In 2017 felt to have some coronary spasm therefore ARB switched to amlodipine  - Continue aspirin  81 mg daily - Continue amlodipine  10 mg, metoprolol  12.5 mg, rosuvastatin  40 mg,  - Encourage continued alcohol abstinence.      Essential hypertension (Chronic)   BP well-controlled on current regimen: -Continue amlodipine  10 mg daily along with Toprol  25 mg daily.      Relevant Orders   EKG 12-Lead (Completed)   Hyperlipidemia with target low density lipoprotein (LDL) cholesterol less than 55 mg/dL (Chronic)   LDL cholesterol not at goal,  with LDL of 82 on rosuvastatin  40 mg daily.  Dietary changes may improve lipid profile. - Order fasting lipid panel. - Adjust medical regimen if necessary => may consider Nexletol versus referral for PCSK9 inhibitor pending on results. - Encourage continued plant-based diet and lifestyle modifications.      Relevant Orders   EKG 12-Lead (Completed)   Hepatic function panel   Lipid panel   ST elevation myocardial infarction (STEMI) of inferior wall, subsequent episode of care [facility]) (Chronic)   9 years out from inferior STEMI with RCA PCI. No longer having any concerning symptoms for angina. EF normalized despite follow-up with Takotsubo Cardiomyopathy episode.      Stenosis of right subclavian artery (HCC) (Chronic)   Asymptomatic.  No signs of subclavian steal.  Most recent Dopplers did not show any evidence of subclavian flow disruption.        Other   Allergy to alpha-gal   Apparently this has resolved.      Heart palpitations (Chronic)    Palpitations well-controlled on current dose of Toprol  12.5 mg daily.  No change.      Relevant Orders   EKG 12-Lead (Completed)   ECHOCARDIOGRAM COMPLETE        Follow-Up: Return in about 1 year (around 11/27/2024) for Routine follow up with me, [employer].  Recording duration: 19 minutes    Signed, Arleen Lacer, MD, MS Randene Bustard, M.D., M.S. Interventional Cardiologist  [facility]  Pager # [phone] Phone # [phone] [address] [place], [place] [address]

## 2023-12-02 ENCOUNTER — Encounter: Payer: Self-pay | Admitting: Cardiology

## 2023-12-02 NOTE — Assessment & Plan Note (Signed)
 9 years out from inferior STEMI with RCA PCI. No longer having any concerning symptoms for angina. EF normalized despite follow-up with Takotsubo Cardiomyopathy episode.

## 2023-12-02 NOTE — Assessment & Plan Note (Signed)
 Palpitations well-controlled on current dose of Toprol  12.5 mg daily.  No change.

## 2023-12-02 NOTE — Assessment & Plan Note (Signed)
 BP well-controlled on current regimen: -Continue amlodipine  10 mg daily along with Toprol  25 mg daily.

## 2023-12-02 NOTE — Assessment & Plan Note (Signed)
 Asymptomatic.  No signs of subclavian steal.  Most recent Dopplers did not show any evidence of subclavian flow disruption.

## 2023-12-02 NOTE — Assessment & Plan Note (Signed)
 Multiple stents in the RCA in February 2016 they are patent by cath in April 2017. -On ASA 81 mg monotherapy.  (Converted from Plavix  in 2024) Okay to hold aspirin  5 to 7 days preop for surgeries or procedures.

## 2023-12-02 NOTE — Assessment & Plan Note (Addendum)
 Asymptomatic post-myocardial infarction with well-controlled hypertension.  In 2017 felt to have some coronary spasm therefore ARB switched to amlodipine  - Continue aspirin  81 mg daily - Continue amlodipine  10 mg, metoprolol  12.5 mg, rosuvastatin  40 mg,  - Encourage continued alcohol abstinence.

## 2023-12-02 NOTE — Assessment & Plan Note (Signed)
 LDL cholesterol not at goal, with LDL of 82 on rosuvastatin  40 mg daily.  Dietary changes may improve lipid profile. - Order fasting lipid panel. - Adjust medical regimen if necessary => may consider Nexletol versus referral for PCSK9 inhibitor pending on results. - Encourage continued plant-based diet and lifestyle modifications.

## 2023-12-02 NOTE — Assessment & Plan Note (Signed)
 Apparently this has resolved.

## 2023-12-02 NOTE — Assessment & Plan Note (Signed)
 Thankfully, echo yesterday showed only moderate regurgitation with mild calcification.  Also she remains essentially asymptomatic-with no CHF symptoms..  No surgical intervention needed currently. - Continue annual echocardiogram to monitor regurgitation. - Maintain blood pressure control. - Educated on heart failure symptoms to report.

## 2023-12-06 ENCOUNTER — Ambulatory Visit
Admission: RE | Admit: 2023-12-06 | Discharge: 2023-12-06 | Disposition: A | Discharge status: Home / Self Care | Source: Ambulatory Visit | Attending: Family Medicine | Admitting: Family Medicine

## 2023-12-06 DIAGNOSIS — Z1231 Encounter for screening mammogram for malignant neoplasm of breast: Secondary | ICD-10-CM

## 2023-12-26 DIAGNOSIS — K080 Exfoliation of teeth due to systemic causes: Secondary | ICD-10-CM | POA: Diagnosis not present

## 2024-01-28 ENCOUNTER — Other Ambulatory Visit: Payer: Self-pay | Admitting: Cardiology

## 2024-02-09 ENCOUNTER — Other Ambulatory Visit: Payer: Self-pay | Admitting: Cardiology

## 2024-02-13 DIAGNOSIS — K080 Exfoliation of teeth due to systemic causes: Secondary | ICD-10-CM | POA: Diagnosis not present

## 2024-03-04 ENCOUNTER — Telehealth: Payer: Self-pay | Admitting: Cardiology

## 2024-03-04 DIAGNOSIS — I771 Stricture of artery: Secondary | ICD-10-CM

## 2024-03-04 DIAGNOSIS — R0989 Other specified symptoms and signs involving the circulatory and respiratory systems: Secondary | ICD-10-CM

## 2024-03-04 NOTE — Telephone Encounter (Signed)
 Pt had to r/s Carotid and it put it out past Expiration date. Please put in new order and attach order. Thanks

## 2024-03-04 NOTE — Telephone Encounter (Signed)
 Replaced order with updated date.

## 2024-03-19 ENCOUNTER — Encounter (HOSPITAL_COMMUNITY)

## 2024-03-21 ENCOUNTER — Encounter (HOSPITAL_COMMUNITY)

## 2024-03-25 ENCOUNTER — Ambulatory Visit (HOSPITAL_COMMUNITY)
Admission: RE | Admit: 2024-03-25 | Discharge: 2024-03-25 | Disposition: A | Discharge status: Home / Self Care | Source: Ambulatory Visit | Attending: Cardiology | Admitting: Cardiology

## 2024-03-25 DIAGNOSIS — I771 Stricture of artery: Secondary | ICD-10-CM | POA: Insufficient documentation

## 2024-03-25 DIAGNOSIS — R0989 Other specified symptoms and signs involving the circulatory and respiratory systems: Secondary | ICD-10-CM | POA: Diagnosis not present

## 2024-03-28 ENCOUNTER — Ambulatory Visit: Payer: Self-pay | Admitting: Cardiology

## 2024-04-01 DIAGNOSIS — D225 Melanocytic nevi of trunk: Secondary | ICD-10-CM | POA: Diagnosis not present

## 2024-04-01 DIAGNOSIS — L570 Actinic keratosis: Secondary | ICD-10-CM | POA: Diagnosis not present

## 2024-04-01 DIAGNOSIS — L309 Dermatitis, unspecified: Secondary | ICD-10-CM | POA: Diagnosis not present

## 2024-04-01 DIAGNOSIS — L820 Inflamed seborrheic keratosis: Secondary | ICD-10-CM | POA: Diagnosis not present

## 2024-04-01 DIAGNOSIS — L821 Other seborrheic keratosis: Secondary | ICD-10-CM | POA: Diagnosis not present

## 2024-04-01 DIAGNOSIS — L814 Other melanin hyperpigmentation: Secondary | ICD-10-CM | POA: Diagnosis not present

## 2024-04-02 DIAGNOSIS — H40023 Open angle with borderline findings, high risk, bilateral: Secondary | ICD-10-CM | POA: Diagnosis not present

## 2024-04-02 DIAGNOSIS — H43813 Vitreous degeneration, bilateral: Secondary | ICD-10-CM | POA: Diagnosis not present

## 2024-04-02 DIAGNOSIS — H04123 Dry eye syndrome of bilateral lacrimal glands: Secondary | ICD-10-CM | POA: Diagnosis not present

## 2024-04-02 NOTE — Progress Notes (Signed)
 Letter sent

## 2024-04-14 DIAGNOSIS — L03119 Cellulitis of unspecified part of limb: Secondary | ICD-10-CM | POA: Diagnosis not present

## 2024-04-15 ENCOUNTER — Other Ambulatory Visit: Payer: Self-pay

## 2024-04-15 ENCOUNTER — Inpatient Hospital Stay (HOSPITAL_COMMUNITY)
Admission: EM | Admit: 2024-04-15 | Discharge: 2024-04-18 | DRG: 603 | Disposition: A | Discharge status: Home / Self Care | Attending: Internal Medicine | Admitting: Internal Medicine

## 2024-04-15 ENCOUNTER — Encounter (HOSPITAL_COMMUNITY): Payer: Self-pay

## 2024-04-15 DIAGNOSIS — Z87891 Personal history of nicotine dependence: Secondary | ICD-10-CM | POA: Diagnosis not present

## 2024-04-15 DIAGNOSIS — I5030 Unspecified diastolic (congestive) heart failure: Secondary | ICD-10-CM | POA: Diagnosis present

## 2024-04-15 DIAGNOSIS — L0390 Cellulitis, unspecified: Secondary | ICD-10-CM | POA: Diagnosis present

## 2024-04-15 DIAGNOSIS — W57XXXA Bitten or stung by nonvenomous insect and other nonvenomous arthropods, initial encounter: Secondary | ICD-10-CM | POA: Diagnosis present

## 2024-04-15 DIAGNOSIS — Z83438 Family history of other disorder of lipoprotein metabolism and other lipidemia: Secondary | ICD-10-CM | POA: Diagnosis not present

## 2024-04-15 DIAGNOSIS — R600 Localized edema: Secondary | ICD-10-CM | POA: Diagnosis not present

## 2024-04-15 DIAGNOSIS — Z91013 Allergy to seafood: Secondary | ICD-10-CM

## 2024-04-15 DIAGNOSIS — I110 Hypertensive heart disease with heart failure: Secondary | ICD-10-CM | POA: Diagnosis present

## 2024-04-15 DIAGNOSIS — Z8614 Personal history of Methicillin resistant Staphylococcus aureus infection: Secondary | ICD-10-CM | POA: Diagnosis not present

## 2024-04-15 DIAGNOSIS — Z87442 Personal history of urinary calculi: Secondary | ICD-10-CM

## 2024-04-15 DIAGNOSIS — Z8249 Family history of ischemic heart disease and other diseases of the circulatory system: Secondary | ICD-10-CM

## 2024-04-15 DIAGNOSIS — Z888 Allergy status to other drugs, medicaments and biological substances status: Secondary | ICD-10-CM | POA: Diagnosis not present

## 2024-04-15 DIAGNOSIS — M19072 Primary osteoarthritis, left ankle and foot: Secondary | ICD-10-CM | POA: Diagnosis not present

## 2024-04-15 DIAGNOSIS — E785 Hyperlipidemia, unspecified: Secondary | ICD-10-CM | POA: Diagnosis present

## 2024-04-15 DIAGNOSIS — Z881 Allergy status to other antibiotic agents status: Secondary | ICD-10-CM

## 2024-04-15 DIAGNOSIS — L03116 Cellulitis of left lower limb: Secondary | ICD-10-CM | POA: Diagnosis present

## 2024-04-15 DIAGNOSIS — Z91048 Other nonmedicinal substance allergy status: Secondary | ICD-10-CM | POA: Diagnosis not present

## 2024-04-15 DIAGNOSIS — I252 Old myocardial infarction: Secondary | ICD-10-CM

## 2024-04-15 DIAGNOSIS — Z955 Presence of coronary angioplasty implant and graft: Secondary | ICD-10-CM

## 2024-04-15 DIAGNOSIS — L03032 Cellulitis of left toe: Secondary | ICD-10-CM | POA: Diagnosis not present

## 2024-04-15 DIAGNOSIS — Z79899 Other long term (current) drug therapy: Secondary | ICD-10-CM

## 2024-04-15 DIAGNOSIS — Z7982 Long term (current) use of aspirin: Secondary | ICD-10-CM

## 2024-04-15 DIAGNOSIS — M129 Arthropathy, unspecified: Secondary | ICD-10-CM | POA: Diagnosis not present

## 2024-04-15 DIAGNOSIS — M869 Osteomyelitis, unspecified: Secondary | ICD-10-CM | POA: Diagnosis not present

## 2024-04-15 DIAGNOSIS — M7989 Other specified soft tissue disorders: Secondary | ICD-10-CM | POA: Diagnosis not present

## 2024-04-15 LAB — URINALYSIS, W/ REFLEX TO CULTURE (INFECTION SUSPECTED)
Bilirubin Urine: NEGATIVE
Glucose, UA: NEGATIVE mg/dL
Hgb urine dipstick: NEGATIVE
Ketones, ur: 5 mg/dL — AB
Leukocytes,Ua: NEGATIVE
Nitrite: NEGATIVE
Protein, ur: 30 mg/dL — AB
Specific Gravity, Urine: 1.031 — ABNORMAL HIGH (ref 1.005–1.030)
pH: 5 (ref 5.0–8.0)

## 2024-04-15 LAB — CBC WITH DIFFERENTIAL/PLATELET
Abs Immature Granulocytes: 0.02 K/uL (ref 0.00–0.07)
Basophils Absolute: 0.1 K/uL (ref 0.0–0.1)
Basophils Relative: 1 %
Eosinophils Absolute: 0.2 K/uL (ref 0.0–0.5)
Eosinophils Relative: 3 %
HCT: 40.9 % (ref 36.0–46.0)
Hemoglobin: 13.5 g/dL (ref 12.0–15.0)
Immature Granulocytes: 0 %
Lymphocytes Relative: 27 %
Lymphs Abs: 1.6 K/uL (ref 0.7–4.0)
MCH: 32 pg (ref 26.0–34.0)
MCHC: 33 g/dL (ref 30.0–36.0)
MCV: 96.9 fL (ref 80.0–100.0)
Monocytes Absolute: 0.8 K/uL (ref 0.1–1.0)
Monocytes Relative: 13 %
Neutro Abs: 3.3 K/uL (ref 1.7–7.7)
Neutrophils Relative %: 56 %
Platelets: 211 K/uL (ref 150–400)
RBC: 4.22 MIL/uL (ref 3.87–5.11)
RDW: 12.3 % (ref 11.5–15.5)
WBC: 5.9 K/uL (ref 4.0–10.5)
nRBC: 0 % (ref 0.0–0.2)

## 2024-04-15 LAB — COMPREHENSIVE METABOLIC PANEL WITH GFR
ALT: 48 U/L — ABNORMAL HIGH (ref 0–44)
AST: 43 U/L — ABNORMAL HIGH (ref 15–41)
Albumin: 4.3 g/dL (ref 3.5–5.0)
Alkaline Phosphatase: 59 U/L (ref 38–126)
Anion gap: 12 (ref 5–15)
BUN: 20 mg/dL (ref 8–23)
CO2: 16 mmol/L — ABNORMAL LOW (ref 22–32)
Calcium: 10 mg/dL (ref 8.9–10.3)
Chloride: 111 mmol/L (ref 98–111)
Creatinine, Ser: 1.79 mg/dL — ABNORMAL HIGH (ref 0.44–1.00)
GFR, Estimated: 30 mL/min — ABNORMAL LOW (ref >60)
Glucose, Bld: 101 mg/dL — ABNORMAL HIGH (ref 70–99)
Potassium: 4 mmol/L (ref 3.5–5.1)
Sodium: 139 mmol/L (ref 135–145)
Total Bilirubin: 0.6 mg/dL (ref 0.0–1.2)
Total Protein: 7.2 g/dL (ref 6.5–8.1)

## 2024-04-15 LAB — I-STAT CG4 LACTIC ACID, ED: Lactic Acid, Venous: 1.2 mmol/L (ref 0.5–1.9)

## 2024-04-15 NOTE — ED Triage Notes (Signed)
 Pt came in d/t bitten by bugs while camping last week & a bite that was in between her toes on the Lt foot became infected. Has took oral ABT for it & yesterday seen a doctor & advised her to come into ED d/t worsening infection/Cellulitis & the need for IV ABT (per pt). Lt foot is swollen, red & warm to the touch. A/Ox4, rates her pain 1/10 does throb at times, denies any drainage.

## 2024-04-16 ENCOUNTER — Emergency Department (HOSPITAL_COMMUNITY)

## 2024-04-16 ENCOUNTER — Inpatient Hospital Stay (HOSPITAL_COMMUNITY)

## 2024-04-16 DIAGNOSIS — Z91048 Other nonmedicinal substance allergy status: Secondary | ICD-10-CM | POA: Diagnosis not present

## 2024-04-16 DIAGNOSIS — Z888 Allergy status to other drugs, medicaments and biological substances status: Secondary | ICD-10-CM | POA: Diagnosis not present

## 2024-04-16 DIAGNOSIS — I5030 Unspecified diastolic (congestive) heart failure: Secondary | ICD-10-CM | POA: Diagnosis present

## 2024-04-16 DIAGNOSIS — R600 Localized edema: Secondary | ICD-10-CM | POA: Diagnosis not present

## 2024-04-16 DIAGNOSIS — W57XXXA Bitten or stung by nonvenomous insect and other nonvenomous arthropods, initial encounter: Secondary | ICD-10-CM | POA: Diagnosis present

## 2024-04-16 DIAGNOSIS — Z881 Allergy status to other antibiotic agents status: Secondary | ICD-10-CM | POA: Diagnosis not present

## 2024-04-16 DIAGNOSIS — L0390 Cellulitis, unspecified: Secondary | ICD-10-CM | POA: Diagnosis present

## 2024-04-16 DIAGNOSIS — L03032 Cellulitis of left toe: Secondary | ICD-10-CM

## 2024-04-16 DIAGNOSIS — Z79899 Other long term (current) drug therapy: Secondary | ICD-10-CM | POA: Diagnosis not present

## 2024-04-16 DIAGNOSIS — Z8614 Personal history of Methicillin resistant Staphylococcus aureus infection: Secondary | ICD-10-CM | POA: Diagnosis not present

## 2024-04-16 DIAGNOSIS — Z8249 Family history of ischemic heart disease and other diseases of the circulatory system: Secondary | ICD-10-CM | POA: Diagnosis not present

## 2024-04-16 DIAGNOSIS — Z91013 Allergy to seafood: Secondary | ICD-10-CM | POA: Diagnosis not present

## 2024-04-16 DIAGNOSIS — Z955 Presence of coronary angioplasty implant and graft: Secondary | ICD-10-CM | POA: Diagnosis not present

## 2024-04-16 DIAGNOSIS — Z7982 Long term (current) use of aspirin: Secondary | ICD-10-CM | POA: Diagnosis not present

## 2024-04-16 DIAGNOSIS — Z83438 Family history of other disorder of lipoprotein metabolism and other lipidemia: Secondary | ICD-10-CM | POA: Diagnosis not present

## 2024-04-16 DIAGNOSIS — I110 Hypertensive heart disease with heart failure: Secondary | ICD-10-CM | POA: Diagnosis present

## 2024-04-16 DIAGNOSIS — Z87891 Personal history of nicotine dependence: Secondary | ICD-10-CM | POA: Diagnosis not present

## 2024-04-16 DIAGNOSIS — M7989 Other specified soft tissue disorders: Secondary | ICD-10-CM | POA: Diagnosis not present

## 2024-04-16 DIAGNOSIS — M869 Osteomyelitis, unspecified: Secondary | ICD-10-CM | POA: Diagnosis not present

## 2024-04-16 DIAGNOSIS — E785 Hyperlipidemia, unspecified: Secondary | ICD-10-CM | POA: Diagnosis present

## 2024-04-16 DIAGNOSIS — Z87442 Personal history of urinary calculi: Secondary | ICD-10-CM | POA: Diagnosis not present

## 2024-04-16 DIAGNOSIS — L03116 Cellulitis of left lower limb: Secondary | ICD-10-CM | POA: Diagnosis not present

## 2024-04-16 DIAGNOSIS — M129 Arthropathy, unspecified: Secondary | ICD-10-CM | POA: Diagnosis not present

## 2024-04-16 DIAGNOSIS — I252 Old myocardial infarction: Secondary | ICD-10-CM | POA: Diagnosis not present

## 2024-04-16 DIAGNOSIS — M19072 Primary osteoarthritis, left ankle and foot: Secondary | ICD-10-CM | POA: Diagnosis not present

## 2024-04-16 LAB — CBC
HCT: 35.6 % — ABNORMAL LOW (ref 36.0–46.0)
Hemoglobin: 12.1 g/dL (ref 12.0–15.0)
MCH: 32.5 pg (ref 26.0–34.0)
MCHC: 34 g/dL (ref 30.0–36.0)
MCV: 95.7 fL (ref 80.0–100.0)
Platelets: 205 K/uL (ref 150–400)
RBC: 3.72 MIL/uL — ABNORMAL LOW (ref 3.87–5.11)
RDW: 12.3 % (ref 11.5–15.5)
WBC: 5.4 K/uL (ref 4.0–10.5)
nRBC: 0 % (ref 0.0–0.2)

## 2024-04-16 LAB — C-REACTIVE PROTEIN: CRP: 0.6 mg/dL

## 2024-04-16 LAB — CREATININE, SERUM
Creatinine, Ser: 1.64 mg/dL — ABNORMAL HIGH (ref 0.44–1.00)
GFR, Estimated: 33 mL/min — ABNORMAL LOW (ref >60)

## 2024-04-16 LAB — BRAIN NATRIURETIC PEPTIDE: B Natriuretic Peptide: 53.6 pg/mL (ref 0.0–100.0)

## 2024-04-16 LAB — SEDIMENTATION RATE: Sed Rate: 25 mm/h — ABNORMAL HIGH (ref 0–22)

## 2024-04-16 MED ORDER — ROSUVASTATIN CALCIUM 20 MG PO TABS
40.0000 mg | ORAL_TABLET | Freq: Every day | ORAL | Status: DC
  Administered 2024-04-16 – 2024-04-18 (×3): 40 mg via ORAL
  Filled 2024-04-16 (×4): qty 2

## 2024-04-16 MED ORDER — AMLODIPINE BESYLATE 10 MG PO TABS
10.0000 mg | ORAL_TABLET | Freq: Every day | ORAL | Status: DC
Start: 2024-04-16 — End: 2024-04-18
  Administered 2024-04-16 – 2024-04-17 (×2): 10 mg via ORAL
  Filled 2024-04-16 (×2): qty 1
  Filled 2024-04-16: qty 2

## 2024-04-16 MED ORDER — SODIUM CHLORIDE 0.9 % IV SOLN
2.0000 g | Freq: Once | INTRAVENOUS | Status: AC
  Administered 2024-04-16: 2 g via INTRAVENOUS
  Filled 2024-04-16: qty 12.5

## 2024-04-16 MED ORDER — OXYCODONE HCL 5 MG PO TABS: 5.0000 mg | ORAL_TABLET | ORAL | Status: DC | PRN

## 2024-04-16 MED ORDER — ACETAMINOPHEN 500 MG PO TABS
1000.0000 mg | ORAL_TABLET | Freq: Once | ORAL | Status: AC
  Administered 2024-04-16: 1000 mg via ORAL
  Filled 2024-04-16: qty 2

## 2024-04-16 MED ORDER — GADOBUTROL 1 MMOL/ML IV SOLN
6.0000 mL | Freq: Once | INTRAVENOUS | Status: AC | PRN
  Administered 2024-04-16: 6 mL via INTRAVENOUS

## 2024-04-16 MED ORDER — LINEZOLID 600 MG/300ML IV SOLN
600.0000 mg | Freq: Two times a day (BID) | INTRAVENOUS | Status: DC
  Administered 2024-04-16 – 2024-04-17 (×4): 600 mg via INTRAVENOUS
  Filled 2024-04-16 (×6): qty 300

## 2024-04-16 MED ORDER — FUROSEMIDE 10 MG/ML IJ SOLN
20.0000 mg | Freq: Two times a day (BID) | INTRAMUSCULAR | Status: AC
  Administered 2024-04-17: 20 mg via INTRAVENOUS
  Filled 2024-04-16: qty 2
  Filled 2024-04-16: qty 4

## 2024-04-16 MED ORDER — ALPRAZOLAM 0.5 MG PO TABS
0.5000 mg | ORAL_TABLET | Freq: Every day | ORAL | Status: DC
  Administered 2024-04-17 (×2): 0.5 mg via ORAL
  Filled 2024-04-16 (×2): qty 1

## 2024-04-16 MED ORDER — ENOXAPARIN SODIUM 40 MG/0.4ML IJ SOSY
40.0000 mg | PREFILLED_SYRINGE | INTRAMUSCULAR | Status: DC
  Filled 2024-04-16 (×2): qty 0.4

## 2024-04-16 MED ORDER — PANTOPRAZOLE SODIUM 40 MG PO TBEC
40.0000 mg | DELAYED_RELEASE_TABLET | Freq: Two times a day (BID) | ORAL | Status: DC
  Administered 2024-04-16 – 2024-04-18 (×4): 40 mg via ORAL
  Filled 2024-04-16 (×6): qty 1

## 2024-04-16 MED ORDER — ASPIRIN 81 MG PO TBEC
81.0000 mg | DELAYED_RELEASE_TABLET | Freq: Every day | ORAL | Status: DC
  Administered 2024-04-16 – 2024-04-18 (×3): 81 mg via ORAL
  Filled 2024-04-16 (×4): qty 1

## 2024-04-16 MED ORDER — ACETAMINOPHEN 325 MG PO TABS: 650.0000 mg | ORAL_TABLET | Freq: Four times a day (QID) | ORAL | Status: DC | PRN

## 2024-04-16 MED ORDER — SODIUM CHLORIDE 0.9 % IV SOLN
2.0000 g | Freq: Two times a day (BID) | INTRAVENOUS | Status: DC
  Administered 2024-04-16 – 2024-04-17 (×3): 2 g via INTRAVENOUS
  Filled 2024-04-16 (×3): qty 12.5

## 2024-04-16 MED ORDER — METOPROLOL SUCCINATE ER 25 MG PO TB24
12.5000 mg | ORAL_TABLET | Freq: Every day | ORAL | Status: DC
  Administered 2024-04-17: 12.5 mg via ORAL
  Filled 2024-04-16 (×3): qty 1

## 2024-04-16 NOTE — ED Notes (Signed)
 Patient states that admitting MD states she could take her home meds instead of using the hospital meds.

## 2024-04-16 NOTE — ED Provider Notes (Signed)
 [facility] Provider Note   CSN: [id number] Arrival date & time: 04/15/24  1554     Patient presents with: Lt foot infection   Laura Herrera is a 73 y.o. female with past medical history of hypertension, hyperlipidemia, coronary artery disease, NSTEMI is presenting to emergency room with complaint of left foot cellulitis.  Patient reports that she has had approximately 5 days of left foot cellulitis worse over her 2nd and 3rd toe.  She reports she has been on 3 days of antibiotics both Bactrim and Keflex  and has been taking them regularly as scheduled without any significant improvement of symptoms.  She does have history of MRSA infection.  She denies any fever.  She has no history of diabetes.  Not a current smoker. States this started after bug bite/possible spider bite.   HPI     Prior to Admission medications   Medication Sig Start Date End Date Taking? Authorizing Provider  acetaminophen  (TYLENOL ) 500 MG tablet Take 500 mg by mouth every 6 (six) hours as needed for headache.    [provider]  ALPRAZolam  (XANAX ) 0.25 MG tablet Take 0.5 mg by mouth at bedtime. 11/12/15   [provider]  amLODipine  (NORVASC ) 10 MG tablet TAKE 1 TABLET BY MOUTH EVERY DAY 01/29/24   Anner Alm ORN, MD  aspirin  EC 81 MG tablet Take 1 tablet (81 mg total) by mouth daily. 10/30/19   Anner Alm ORN, MD  BIOTIN PO Take 1 tablet by mouth daily.    [provider]  CALCIUM  PO Take 1 tablet by mouth daily.    [provider]  cephALEXin  (KEFLEX ) 500 MG capsule Take 1 capsule (500 mg total) by mouth 2 (two) times daily. 02/02/23   Emelia Josefa HERO, NP  cholecalciferol  (VITAMIN D ) 1000 UNITS tablet Take 1,000 Units by mouth daily.    [provider]  EPINEPHrine  0.3 mg/0.3 mL IJ SOAJ injection Inject 0.3 mg into the muscle as needed for anaphylaxis. 07/25/21   [provider]  furosemide  (LASIX ) 20 MG tablet  Take 1 tablet (20 mg total) by mouth as needed. 02/01/23   Anner Alm ORN, MD  metoprolol  succinate (TOPROL -XL) 25 MG 24 hr tablet TAKE 1/2 TABLET BY MOUTH EVERY DAY 02/11/24   Anner Alm ORN, MD  mupirocin ointment WYNEMA) 2 % SMARTSIG:sparingly Topical Daily 11/10/23   [provider]  nitroGLYCERIN  (NITROSTAT ) 0.4 MG SL tablet Place 1 tablet (0.4 mg total) under the tongue every 5 (five) minutes x 3 doses as needed for chest pain. 01/10/17   Strader, Laymon HERO, PA-C  pantoprazole  (PROTONIX ) 40 MG tablet TAKE 1 TABLET BY MOUTH DAILY AT 12 NOON. 12/09/19   Anner Alm ORN, MD  rosuvastatin  (CRESTOR ) 40 MG tablet TAKE 1 TABLET(40 MG) BY MOUTH DAILY 09/13/23   Anner Alm ORN, MD  triamcinolone cream (KENALOG) 0.1 % Apply 1 Application topically at bedtime.    [provider]  valACYclovir (VALTREX) 500 MG tablet SMARTSIG:1 Tablet(s) By Mouth Every 12 Hours 09/13/23   [provider]    Allergies: Cholinesterase inhibitors, Other, Adhesive [tape], Alpha-gal, Shellfish-derived products, Chlorhexidine  gluconate, Epinephrine , and Vancomycin    Review of Systems  Skin:  Positive for wound.    Updated Vital Signs BP (!) 146/52 (BP Location: Right Arm)   Pulse 74   Temp 97.9 F (36.6 C)   Resp 16   Ht 5' 8 (1.727 m)   Wt 68.5 kg   SpO2  99%   BMI 22.96 kg/m   Physical Exam Vitals and nursing note reviewed.  Constitutional:      General: She is not in acute distress.    Appearance: She is not toxic-appearing.  HENT:     Head: Normocephalic and atraumatic.  Eyes:     General: No scleral icterus.    Conjunctiva/sclera: Conjunctivae normal.  Cardiovascular:     Rate and Rhythm: Normal rate and regular rhythm.     Pulses: Normal pulses.     Heart sounds: Normal heart sounds.  Pulmonary:     Effort: Pulmonary effort is normal. No respiratory distress.     Breath sounds: Normal breath sounds.  Abdominal:     General: Abdomen is flat. Bowel sounds are normal.      Palpations: Abdomen is soft.     Tenderness: There is no abdominal tenderness.  Musculoskeletal:     Right lower leg: No edema.     Left lower leg: Edema present.  Skin:    General: Skin is warm and dry.     Findings: No lesion.  Neurological:     General: No focal deficit present.     Mental Status: She is alert and oriented to person, place, and time. Mental status is at baseline.          (all labs ordered are listed, but only abnormal results are displayed) Labs Reviewed  COMPREHENSIVE METABOLIC PANEL WITH GFR - Abnormal; Notable for the following components:      Result Value   CO2 16 (*)    Glucose, Bld 101 (*)    Creatinine, Ser 1.79 (*)    AST 43 (*)    ALT 48 (*)    GFR, Estimated 30 (*)    All other components within normal limits  URINALYSIS, W/ REFLEX TO CULTURE (INFECTION SUSPECTED) - Abnormal; Notable for the following components:   Color, Urine AMBER (*)    APPearance HAZY (*)    Specific Gravity, Urine 1.031 (*)    Ketones, ur 5 (*)    Protein, ur 30 (*)    Bacteria, UA RARE (*)    All other components within normal limits  CBC WITH DIFFERENTIAL/PLATELET  I-STAT CG4 LACTIC ACID, ED  I-STAT CG4 LACTIC ACID, ED    EKG: None  Radiology: DG Foot 2 Views Left Result Date: 04/16/2024 EXAM: 2 VIEW(S) XRAY OF THE LEFT FOOT 04/16/2024 08:44:41 AM COMPARISON: None available. CLINICAL HISTORY: Cellulitis, swelling. Per chart - Pt came in d/t bitten by bugs while camping last week \\T \ a bite that was in between her toes on the Lt foot became infected. Has took oral ABT for it \\T \ yesterday seen a doctor \\T \ advised her to come into ED d/t worsening infection/Cellulitis \\T \ the need for IV ABT (per pt). Lt foot is swollen, red \\T \ warm to the touch. A/Ox4, rates her pain 1/10 does throb at times, denies any drainage. FINDINGS: BONES AND JOINTS: No acute fracture. No focal osseous lesion. No joint dislocation. Midfoot degenerative changes with dorsal  osteophytes. SOFT TISSUES: Forefoot dorsal soft tissue swelling. IMPRESSION: 1. Left forefoot dorsal soft tissue swelling, consistent with cellulitis. 2. Left midfoot degenerative changes with dorsal osteophytes. Electronically signed by: Waddell Calk MD 04/16/2024 09:13 AM EDT RP Workstation: [id number]     Procedures   Medications Ordered in the ED  linezolid  (ZYVOX ) IVPB 600 mg (has no administration in time range)  ceFEPIme  (MAXIPIME ) 2 g in sodium chloride  0.9 % 100 mL IVPB (has  no administration in time range)  acetaminophen  (TYLENOL ) tablet 1,000 mg (1,000 mg Oral Given 04/16/24 0846)                                    Medical Decision Making Amount and/or Complexity of Data Reviewed Labs: ordered. Radiology: ordered.  Risk OTC drugs. Prescription drug management. Decision regarding hospitalization.   This patient presents to the ED for concern of cellulitis, this involves an extensive number of treatment options, and is a complaint that carries with it a high risk of complications and morbidity.  The differential diagnosis includes abscess, deep space infection, osteomyelitis   Co morbidities that complicate the patient evaluation  HTN, MRSA   Lab Tests:  I personally interpreted labs.  The pertinent results include:   No leukocytosis.  Lactic is 1.2.   Imaging Studies ordered:  I ordered imaging studies including x-ray of left foot I independently visualized and interpreted imaging which showed no findings suspicious for deep space infection. I agree with the radiologist interpretation   Cardiac Monitoring: / EKG:  The patient was maintained on a cardiac monitor.     Consultations Obtained:  I requested consultation with the hospitalist,  and discussed lab and imaging findings as well as pertinent plan - they recommend: admit Discussed preferred antibiotic treatment with pharmacy who recommended linezolid  and cefepime .   Problem List / ED Course /  Critical interventions / Medication management  Patient presents with several days of left foot swelling, erythema, pain.  She is neurovascularly intact.  She has no history of diabetes and has no current ulceration.  This started after a bug bite she is questioning if it was a spider bite.  She has no elevated white blood cell count.  She is not meeting SIRS criteria area.  She has not had any significant improvement after over 48 hours of antibiotic treatment.  Low concern for deep space infection, low concern for osteomyelitis.  She was sent here by her primary care doctor for admission and IV antibiotic. I ordered medication including antibiotic, Tylenol   Reevaluation of the patient after these medicines showed that the patient stayed the same I have reviewed the patients home medicines and have made adjustments as needed Ultimately feel patient would benefit from admission for IV antibiotic due to failed outpatient treatment of cellulitis.        Final diagnoses:  Cellulitis of left foot    ED Discharge Orders     None          Shermon Warren SAILOR, PA-C 04/16/24 [id number]    Levander Houston, MD 04/16/24 1517

## 2024-04-16 NOTE — Progress Notes (Signed)
 Patient transferred from ED. Alert and oriented. On RA. Remains in the bed.

## 2024-04-16 NOTE — ED Notes (Addendum)
 Pt. Was going to xray but stated she needs pain meds

## 2024-04-16 NOTE — Plan of Care (Signed)
  Problem: Education: Goal: Knowledge of General Education information will improve Description: Including pain rating scale, medication(s)/side effects and non-pharmacologic comfort measures Outcome: Progressing   Problem: Health Behavior/Discharge Planning: Goal: Ability to manage health-related needs will improve Outcome: Progressing   Problem: Clinical Measurements: Goal: Respiratory complications will improve Outcome: Progressing   Problem: Clinical Measurements: Goal: Cardiovascular complication will be avoided Outcome: Progressing   Problem: Activity: Goal: Risk for activity intolerance will decrease Outcome: Progressing   Problem: Nutrition: Goal: Adequate nutrition will be maintained Outcome: Progressing   Problem: Coping: Goal: Level of anxiety will decrease Outcome: Progressing   Problem: Pain Managment: Goal: General experience of comfort will improve and/or be controlled Outcome: Progressing   Problem: Elimination: Goal: Will not experience complications related to urinary retention Outcome: Progressing   Problem: Safety: Goal: Ability to remain free from injury will improve Outcome: Progressing   Problem: Skin Integrity: Goal: Risk for impaired skin integrity will decrease Outcome: Progressing

## 2024-04-16 NOTE — Progress Notes (Signed)
 ED Pharmacy Antibiotic Sign Off An antibiotic consult was received from an ED provider for cefepime  per pharmacy dosing for cellulitis. A chart review was completed to assess appropriateness.   The following one time order(s) were placed:  Cefepime  2G IV x1   Further antibiotic and/or antibiotic pharmacy consults should be ordered by the admitting provider if indicated.   Thank you for allowing pharmacy to be a part of this patient's care.   Koren LITTIE Or, [facility]  Clinical Pharmacist 04/16/24 9:24 AM

## 2024-04-16 NOTE — H&P (Signed)
 History and Physical    Patient: Laura Herrera FMW:[id number] DOB: 1950-11-20 DOA: 04/15/2024 DOS: the patient was seen and examined on 04/16/2024 PCP: Regino Slater, MD  Patient coming from: Home  Chief Complaint:  Chief Complaint  Patient presents with   Lt foot infection   HPI: Laura Herrera is a 73 y.o. female with medical history significant of STEMI (01/2015, s/p RCA PCI) with post MI angina (?spasm), HTN, HLD, depression, anxiety, and alpha-gal allergy p/w L foot cellulitis.  The patient reported having an insect bite (maybe spider) 2 weeks ago that became red and irritated this past weekend after flying to [place]. The symptoms began on Friday night after flying to [place] for a family dinner. Initially, it was not as red, but worsened over time. The patient [facility] antibiotics [facility] on Sunday, receiving cephalexin  and a bactrim. The patient has been taking these medications diligently since Sunday, setting alarms to ensure proper dosing every six hours. The patient noted that the bite appeared as a regular bump for two weeks before suddenly worsening over a weekend trip. Upon returning to [facility] on Monday, the patient visited [facility] walk-in, where the doctor confirmed the medications were appropriate. The patient was advised to return for further evaluation the following day, when the bite appeared worse (red, tender, and swollen), and pt was advised to report to ED for IV abx.  In the ED, pt tachycaric. Labs notable Cr 1.79-->1.64. XR L foot neg. EDP started IV abx, and requested medicine admission for failed OP oral abx.    Review of Systems: As mentioned in the history of present illness. All other systems reviewed and are negative. Past Medical History:  Diagnosis Date   Allergy to alpha-gal    Aortic valve insufficiency 06/2021   Echo read as normal EF, normal wall motions.  Severe MAC.  Moderate to severe AI.=> On review with imaging  cardiologist, this appears to be an over estimation.  More consistent with mild to moderate AI.   CAD S/P percutaneous coronary angioplasty 01/2015   a. 01/2015 Inf STEMI/PCI: ostRCA 80% (Promus DES 2.75x12 - 3.1 mm), 100% & 70% p-mRCA covered with 1 DES (Promus DES 2.5x24  - 2.8 mm ), dRCA 90%(PTCA) -10% ;  b. 01/2015 Relook: LM nl, mLAD 20%, LCX nl- L->R collats, Thrombus in p-mRCA stent->Aggrastat ;  c. 01/2015 Echo: EF 60-65%, no RWMA, mod AI, PAP . d. 11/2015: CATH: patent stents, FFR neg LM-LAD   Chest pain with high risk for cardiac etiology 11/19/2015   Cardiac catheterization showed stable coronaries with patent RCA stents. FFR of LAD negative. --> LV gram EF suggested Takotsubo versus LAD spasm.   Complication of anesthesia    pseudo cholinesterace deficiency   Depression    Dyslipidemia    Generalized anxiety disorder    Currently being treated only with PRN Xanax  for panic attacks.   Headache    used to have them daily; I no longer do (11/09/2015)   Hypertension    Insomnia    Kidney stones    years ago; passed it   MRSA infection    h/o MRSA infection in the right foot   NSTEMI (non-ST elevated myocardial infarction) (HCC) 11/09/2015   No obvious culprit lesion found.  Thought to be potentially Takotsubo.   PONV (postoperative nausea and vomiting)    Pseudocholinesterase deficiency    Psoriasis    hand and foot (11/09/2015)   ST elevation myocardial infarction (STEMI) of  inferior wall (HCC) 01/2015   Severe RCA disease.  Ostial to proximal.  Single stent with PTCA of distal RCA and the PDA.SABRA   Past Surgical History:  Procedure Laterality Date   ANGIOPLASTY  01/12/2015   Procedure: Angioplasty;  Surgeon: Victory LELON Sharps, MD;  Location: [facility] INVASIVE CV LAB;  Service: Cardiovascular;; Unable to wire RCA -- treated plaque protrusion in pRCA stent with Aggrastat  infusion.   APPENDECTOMY     BREAST BIOPSY     CARDIAC CATHETERIZATION N/A 01/11/2015   Procedure: Left Heart Cath  and Coronary Angiography;  Surgeon: Alm LELON Clay, MD;  Location: [facility] INVASIVE CV LAB;  Service: Cardiovascular;  ost RCA 80% (calcified), pRCA 100% (followed by 70% tandem lesion), dRCA 90%.   Mild to moderate disease in LAD and circumflex   CARDIAC CATHETERIZATION  01/11/2015   Procedure: Coronary Stent Intervention;  Surgeon: Alm LELON Clay, MD;  Location: [facility] INVASIVE CV LAB;  Service: Cardiovascular;; ost RCA 80%- cutting balloon PTCA-> DES PCI (2.75 x 12 Promus DES - 3.1 mm); pRCA (covering tandem 100% -7-%) DES PCI (Promus DES 2.5 x 24 --> 2.8 mm)   CARDIAC CATHETERIZATION  01/11/2015   Procedure: Coronary Balloon Angioplasty;  Surgeon: Alm LELON Clay, MD;  Location: [facility] INVASIVE CV LAB;  Service: Cardiovascular;;PTCA of dRCA 90% --> 10%.   CARDIAC CATHETERIZATION N/A 01/12/2015   Procedure: Left Heart Cath and Coronary Angiography;  Surgeon: Victory LELON Sharps, MD;  Location: [facility] INVASIVE CV LAB;  Service: Cardiovascular;; Relook Cath for recurrent CP post STEMI PCI --> difficult to engage ostial RCA stent (unable to wire) - plaque protrusion in pRCA stent --> Rx with Aggrastat  infusion.   CARDIAC CATHETERIZATION N/A 11/10/2015   Procedure: Left Heart Cath and Coronary Angiography;  Surgeon: Debby DELENA Sor, MD;  Location: [facility] INVASIVE CV LAB;  Service: Cardiovascular; patent RCA stents. Ostial LM 30%, ostial LAD 40% - FFR negative. Moderate-severe LV dysfunction with hypo-kinesis involving the anterior wall to the apex. EF suggested 30%. (? Takotsubo)   CARDIAC CATHETERIZATION N/A 11/10/2015   Procedure: Intravascular Pressure Wire/FFR Study;  Surgeon: Debby DELENA Sor, MD;  Location: [facility] INVASIVE CV LAB;  Service: Cardiovascular;  FFR of LAD negative.   CHOLECYSTECTOMY N/A 07/03/2023   Procedure: LAPAROSCOPIC CHOLECYSTECTOMY;  Surgeon: Rubin Calamity, MD;  Location: [facility] OR;  Service: General;  Laterality: N/A;   DILATION AND CURETTAGE OF UTERUS  1979   S/P miscarriage   INCISION AND DRAINAGE FOOT  Right    spider bite   NOSE SURGERY  X 2   to remove genetic bone spur   TRANSTHORACIC ECHOCARDIOGRAM  11/11/2015   Normal LV size and function. Vigorous function with EF 6570%. GR 1 DD. Mild-moderate MR.   TRANSTHORACIC ECHOCARDIOGRAM  11/09/2022   EF 60 to 65%.  No RWMA AOV sclerosis with no stenosis.  Mean gradient 6.5.  Severe AI.  Normal RV size and function.  Normal PAP. (Images reviewed with Dr. Francyne.  He believes that the pressure half-time does not correlate with severe AI even though the images appear to be relatively severe.  Likely moderate to severe.  LV size is normal as is EF.)   Social History:  reports that she has quit smoking. Her smoking use included cigarettes. She has a 0.2 pack-year smoking history. She has never used smokeless tobacco. She reports current alcohol use of about 20.0 standard drinks of alcohol per week. She reports that she does not use drugs.  Allergies  Allergen Reactions  Cholinesterase Inhibitors Other (See Comments)    Enzyme deficiency which causes muscle paralyses after receiving anesthesia.   Other Other (See Comments) and Rash    Sensitive to the electrodes Tears skin off, and paper tape also   Adhesive [Tape] Other (See Comments)    Tears skin off, and paper tape also   Alpha-Gal Other (See Comments)    No longer has this    Shellfish-Derived Products Swelling    Crabs, lobster, crayfish; tissues in joints swelled   Chlorhexidine  Gluconate Rash    CHG contains glycerin which patient has an allergy to.   Chlorhexidine  Gluconate Dermatitis    CHG contains glycerin which patient has an allergy to.   Epinephrine  Palpitations    Palpitations and involuntary body jerking   Vancomycin Hives and Rash    severe     Family History  Problem Relation Age of Onset   Peripheral vascular disease Mother    Hyperlipidemia Mother    Alzheimer's disease Father    Mental illness Sister    Heart Problems Sister    Schizophrenia Sister     Hypotension Sister    Hyperlipidemia Brother    Heart disease Maternal Grandmother    Breast cancer Maternal Aunt 60   Breast cancer Maternal Aunt    Heart attack Neg Hx     Prior to Admission medications   Medication Sig Start Date End Date Taking? Authorizing Provider  acetaminophen  (TYLENOL ) 500 MG tablet Take 1,500 mg by mouth every 6 (six) hours as needed for headache, mild pain (pain score 1-3) or moderate pain (pain score 4-6).   Yes [provider]  ALPRAZolam  (XANAX ) 0.25 MG tablet Take 0.5 mg by mouth at bedtime. 11/12/15  Yes [provider]  amLODipine  (NORVASC ) 10 MG tablet TAKE 1 TABLET BY MOUTH EVERY DAY 01/29/24  Yes Anner Alm ORN, MD  aspirin  EC 81 MG tablet Take 1 tablet (81 mg total) by mouth daily. 10/30/19  Yes Anner Alm ORN, MD  BIOTIN PO Take 1 tablet by mouth at bedtime.   Yes [provider]  CALCIUM  PO Take 1 tablet by mouth daily.   Yes [provider]  cephALEXin  (KEFLEX ) 500 MG capsule Take 1 capsule (500 mg total) by mouth 2 (two) times daily. Patient taking differently: Take 500 mg by mouth 4 (four) times daily. 02/02/23  Yes Cleaver, Josefa HERO, NP  cholecalciferol  (VITAMIN D ) 1000 UNITS tablet Take 1,000 Units by mouth daily.   Yes [provider]  EPINEPHrine  0.3 mg/0.3 mL IJ SOAJ injection Inject 0.3 mg into the muscle as needed for anaphylaxis. 07/25/21  Yes [provider]  furosemide  (LASIX ) 20 MG tablet Take 1 tablet (20 mg total) by mouth as needed. Patient taking differently: Take 20 mg by mouth as needed for edema or fluid. 02/01/23  Yes Anner Alm ORN, MD  metoprolol  succinate (TOPROL -XL) 25 MG 24 hr tablet TAKE 1/2 TABLET BY MOUTH EVERY DAY 02/11/24  Yes Anner Alm ORN, MD  mupirocin ointment (BACTROBAN) 2 % Apply 1 Application topically daily as needed (issues on feet). 11/10/23  Yes [provider]  nitroGLYCERIN  (NITROSTAT ) 0.4 MG SL tablet Place 1 tablet (0.4 mg total) under the  tongue every 5 (five) minutes x 3 doses as needed for chest pain. 01/10/17  Yes Strader, Laymon HERO, PA-C  pantoprazole  (PROTONIX ) 40 MG tablet TAKE 1 TABLET BY MOUTH DAILY AT 12 NOON. Patient taking differently: Take 40 mg by mouth 2 (two) times daily. 12/09/19  Yes Anner Alm ORN, MD  rosuvastatin  (CRESTOR ) 40 MG tablet TAKE 1 TABLET(40 MG) BY MOUTH DAILY 09/13/23  Yes Anner Alm ORN, MD  sulfamethoxazole-trimethoprim (BACTRIM DS) 800-160 MG tablet Take 160 mg of trimethoprim by mouth 2 (two) times daily. 04/13/24 04/24/24 Yes [provider]  triamcinolone cream (KENALOG) 0.1 % Apply 1 Application topically at bedtime.   Yes [provider]  valACYclovir (VALTREX) 500 MG tablet Take 500 mg by mouth 2 (two) times daily as needed (blisters). 09/13/23  Yes [provider]    Physical Exam: Vitals:   04/15/24 2108 04/16/24 0219 04/16/24 0537 04/16/24 0908  BP: 128/83 (!) 138/48 (!) 146/52 (!) 131/43  Pulse: 72 61 74 63  Resp: 18 16 16 18   Temp: 98.3 F (36.8 C) 98.4 F (36.9 C) 97.9 F (36.6 C) 97.8 F (36.6 C)  TempSrc:  Oral  Oral  SpO2: 99% 98% 99% 100%  Weight:      Height:       General: Alert, oriented x3, resting comfortably in no acute distress HEENT: EOMI, oropharynx clear, moist mucous membranes, hearing intact Neck: Trachea midline and no gross thyromegaly Respiratory: Lungs clear to auscultation bilaterally with normal respiratory effort; no w/r/r Cardiovascular: Regular rate and rhythm w/o m/r/g Abdomen: Soft, nontender, nondistended. Positive bowel sounds MSK: No obvious joint deformities or swelling Skin: No obvious rashes or lesions Neurologic: Awake, alert, spontaneously moves all extremities, strength intact Psychiatric: Appropriate mood and affect, conversational and cooperative   Data Reviewed:  Lab Results  Component Value Date   WBC 5.4 04/16/2024   HGB 12.1 04/16/2024   HCT 35.6 (L) 04/16/2024   MCV 95.7 04/16/2024   PLT 205  04/16/2024   Lab Results  Component Value Date   GLUCOSE 101 (H) 04/15/2024   CALCIUM  10.0 04/15/2024   NA 139 04/15/2024   K 4.0 04/15/2024   CO2 16 (L) 04/15/2024   CL 111 04/15/2024   BUN 20 04/15/2024   CREATININE 1.64 (H) 04/16/2024   Lab Results  Component Value Date   ALT 48 (H) 04/15/2024   AST 43 (H) 04/15/2024   ALKPHOS 59 04/15/2024   BILITOT 0.6 04/15/2024   Lab Results  Component Value Date   INR 0.97 11/18/2015   INR 0.97 11/09/2015   INR 5.39 (HH) 01/11/2015   Radiology: DG Foot 2 Views Left Result Date: 04/16/2024 EXAM: 2 VIEW(S) XRAY OF THE LEFT FOOT 04/16/2024 08:44:41 AM COMPARISON: None available. CLINICAL HISTORY: Cellulitis, swelling. Per chart - Pt came in d/t bitten by bugs while camping last week \\T \ a bite that was in between her toes on the Lt foot became infected. Has took oral ABT for it \\T \ yesterday seen a doctor \\T \ advised her to come into ED d/t worsening infection/Cellulitis \\T \ the need for IV ABT (per pt). Lt foot is swollen, red \\T \ warm to the touch. A/Ox4, rates her pain 1/10 does throb at times, denies any drainage. FINDINGS: BONES AND JOINTS: No acute fracture. No focal osseous lesion. No joint dislocation. Midfoot degenerative changes with dorsal osteophytes. SOFT TISSUES: Forefoot dorsal soft tissue swelling. IMPRESSION: 1. Left forefoot dorsal soft tissue swelling, consistent with cellulitis. 2. Left midfoot degenerative changes with dorsal osteophytes. Electronically signed by: Waddell Calk MD 04/16/2024 09:13 AM EDT RP Workstation: [id number]    Assessment and Plan: 61F  h/o STEMI (01/2015, s/p RCA PCI) with post MI angina (?spasm), HFpEF (EF 60-65% in 11/2023), HTN, HLD, depression, anxiety, and alpha-gal allergy p/w L foot cellulitis.  L foot cellulitis Failed OP keflex  and bactrim; will need to exclude OM -IV linezolid  600mg  BID and IV cefepime  per pharmacy protocol -Tylenol  and oxycodone  prn for pain -F/u ESR/CRP -F/u MRI L  foot -F/u blood cultures  HFpEF BLE 2+ pitting edema to shins; BNP pending -PTA metoprolol  XL 12.5mg  daily -IV lasix  20mg   BID  HTN -PTA amlodipine  10mg  daily  HLD -PTA Crestor     Advance Care Planning:   Code Status: Full Code   Consults: N/A  Family Communication: Husband  Severity of Illness: The appropriate patient status for this patient is INPATIENT. Inpatient status is judged to be reasonable and necessary in order to provide the required intensity of service to ensure the patient's safety. The patient's presenting symptoms, physical exam findings, and initial radiographic and laboratory data in the context of their chronic comorbidities is felt to place them at high risk for further clinical deterioration. Furthermore, it is not anticipated that the patient will be medically stable for discharge from the hospital within 2 midnights of admission.   * I certify that at the point of admission it is my clinical judgment that the patient will require inpatient hospital care spanning beyond 2 midnights from the point of admission due to high intensity of service, high risk for further deterioration and high frequency of surveillance required.*   ------- I spent 60 minutes reviewing previous notes, at the bedside counseling/discussing the treatment plan, and performing clinical documentation.  Author: Marsha Ada, MD 04/16/2024 12:43 PM  For on call review [email or web address].

## 2024-04-16 NOTE — Progress Notes (Signed)
 Patient transported to the MRI via wheel chair

## 2024-04-16 NOTE — Progress Notes (Signed)
 Pharmacy Antibiotic Note  Laura Herrera is a 73 y.o. female admitted on 04/15/2024 with wound infection.  Pharmacy has been consulted for cefepime  dosing.  Plan: Cefepime  2G IV q12 hours  Height: 5' 8 (172.7 cm) Weight: 68.5 kg (151 lb) IBW/kg (Calculated) : 63.9  Temp (24hrs), Avg:98.2 F (36.8 C), Min:97.8 F (36.6 C), Max:98.4 F (36.9 C)  Recent Labs  Lab 04/15/24 1615 04/15/24 1626 04/16/24 0941  WBC 5.9  --  5.4  CREATININE 1.79*  --  1.64*  LATICACIDVEN  --  1.2  --     Estimated Creatinine Clearance: 31.3 mL/min (A) (by C-G formula based on SCr of 1.64 mg/dL (H)).    Allergies  Allergen Reactions   Cholinesterase Inhibitors Other (See Comments)    Enzyme deficiency which causes muscle paralyses after receiving anesthesia.   Other Other (See Comments) and Rash    Sensitive to the electrodes Tears skin off, and paper tape also   Adhesive [Tape] Other (See Comments)    Tears skin off, and paper tape also   Alpha-Gal Other (See Comments)    No longer has this    Shellfish-Derived Products Swelling    Crabs, lobster, crayfish; tissues in joints swelled   Chlorhexidine  Gluconate Rash    CHG contains glycerin which patient has an allergy to.   Chlorhexidine  Gluconate Dermatitis    CHG contains glycerin which patient has an allergy to.   Epinephrine  Palpitations    Palpitations and involuntary body jerking   Vancomycin Hives and Rash    severe     Thank you for allowing pharmacy to be a part of this patient's care.  Koren CROME Tomasita Beevers 04/16/2024 1:10 PM

## 2024-04-17 ENCOUNTER — Inpatient Hospital Stay (HOSPITAL_COMMUNITY)

## 2024-04-17 DIAGNOSIS — M7989 Other specified soft tissue disorders: Secondary | ICD-10-CM | POA: Diagnosis not present

## 2024-04-17 DIAGNOSIS — L03116 Cellulitis of left lower limb: Secondary | ICD-10-CM

## 2024-04-17 LAB — CBC
HCT: 34.1 % — ABNORMAL LOW (ref 36.0–46.0)
Hemoglobin: 11.6 g/dL — ABNORMAL LOW (ref 12.0–15.0)
MCH: 32.2 pg (ref 26.0–34.0)
MCHC: 34 g/dL (ref 30.0–36.0)
MCV: 94.7 fL (ref 80.0–100.0)
Platelets: 184 K/uL (ref 150–400)
RBC: 3.6 MIL/uL — ABNORMAL LOW (ref 3.87–5.11)
RDW: 12.2 % (ref 11.5–15.5)
WBC: 4.7 K/uL (ref 4.0–10.5)
nRBC: 0 % (ref 0.0–0.2)

## 2024-04-17 LAB — BASIC METABOLIC PANEL WITH GFR
Anion gap: 9 (ref 5–15)
BUN: 21 mg/dL (ref 8–23)
CO2: 21 mmol/L — ABNORMAL LOW (ref 22–32)
Calcium: 9.3 mg/dL (ref 8.9–10.3)
Chloride: 109 mmol/L (ref 98–111)
Creatinine, Ser: 1.53 mg/dL — ABNORMAL HIGH (ref 0.44–1.00)
GFR, Estimated: 36 mL/min — ABNORMAL LOW (ref >60)
Glucose, Bld: 101 mg/dL — ABNORMAL HIGH (ref 70–99)
Potassium: 4.8 mmol/L (ref 3.5–5.1)
Sodium: 139 mmol/L (ref 135–145)

## 2024-04-17 NOTE — TOC CM/SW Note (Signed)
 Transition of Care [facility]) - Inpatient Brief Assessment   Patient Details  Name: Laura Herrera MRN: [id number] Date of Birth: 23-Oct-1950  Transition of Care [facility]) CM/SW Contact:    Lauraine FORBES Saa, LCSWA Phone Number: 04/17/2024, 9:08 AM   Clinical Narrative:  9:08 AM Per chart review, patient resides at home with significant other. Patient has a PCP and insurance. Patient does not have SNF/HH/DME history. Patient's preferred pharmacy is [facility] [phone] [facility]. No TOC needs identified at this time. TOC will continue to follow and be available to assist.  Transition of Care Asessment: Insurance and Status: Insurance coverage has been reviewed Patient has primary care physician: Yes Home environment has been reviewed: Private Residence Prior level of function:: N/A Prior/Current Home Services: No current home services Social Drivers of Health Review: SDOH reviewed no interventions necessary Readmission risk has been reviewed: Yes (Currently Green 11%) Transition of care needs: no transition of care needs at this time

## 2024-04-17 NOTE — Progress Notes (Signed)
 PROGRESS NOTE    Laura Herrera  FMW:[id number] DOB: 11/07/1950 DOA: 04/15/2024 PCP: Regino Slater, MD   Brief Narrative:   73 y.o. female with medical history significant of STEMI (01/2015, s/p RCA PCI) with post MI angina (?spasm), HTN, HLD, depression, anxiety, and alpha-gal allergy p/w L foot cellulitis. Started on IV antibiotics and symptoms are improving. No evidence of OM on MRI left foot.   Assessment & Plan:  Principal Problem:   Cellulitis   Left foot cellulitis without osteomyelitis,POA: -She Failed OP keflex  and bactrim -IV linezolid  600mg  BID and IV cefepime  per pharmacy protocol -Tylenol  and oxycodone  prn for pain -F/u ESR/CRP-elevated -F/u MRI L foot-No evidence of OM -F/u blood cultures-NGTD -will likely transition to oral antibiotics on 9/12   HFpEF,POA: not in exacerbation BLE 2+ pitting edema to shins; BNP is 53 -continue metoprolol  XL 12.5mg  daily -IV lasix  20mg   BID -LE edema may be in the setting of amlodipine  use   HTN -continue amlodipine  10mg  daily   HLD -On Crestor   Disposition: Home. She is IADL.  DVT prophylaxis: enoxaparin  (LOVENOX ) injection 40 mg Start: 04/16/24 1400     Code Status: Full Code Family Communication:  None at the bedside Status is: Inpatient Remains inpatient appropriate because: Left foot cellulitis    Subjective:  She feels much better. Left foot pain has resolved and redness is improved. She lives at home with her significant other.  Examination:  General exam: Appears calm and comfortable  Respiratory system: Clear to auscultation. Respiratory effort normal. Cardiovascular system: S1 & S2 heard, RRR. No JVD, murmurs, rubs, gallops or clicks. No pedal edema. Gastrointestinal system: Abdomen is nondistended, soft and nontender. No organomegaly or masses felt. Normal bowel sounds heard. Central nervous system: Alert and oriented. No focal neurological deficits. Extremities: Symmetric 5 x 5 power. Skin:Left  foot is slightly swollen and erythematous, no tenderness Psychiatry: Judgement and insight appear normal. Mood & affect appropriate.       Diet Orders (From admission, onward)     Start     Ordered   04/16/24 0943  Diet Carb Modified Fluid consistency: Thin  Diet effective now       Question Answer Comment  Calorie Level Medium 1600-2000   Fluid consistency: Thin      04/16/24 0942            Objective: Vitals:   04/16/24 1530 04/16/24 2300 04/16/24 2306 04/17/24 0936  BP: 113/80 (!) 118/102  (!) 123/53  Pulse: 75 60 60 (!) 58  Resp:  20  18  Temp: 98.1 F (36.7 C) 97.9 F (36.6 C)  98.2 F (36.8 C)  TempSrc: Oral Oral  Oral  SpO2: 96% 100%  100%  Weight:      Height:        Intake/Output Summary (Last 24 hours) at 04/17/2024 1222 Last data filed at 04/17/2024 0233 Gross per 24 hour  Intake 1055.23 ml  Output --  Net 1055.23 ml   Filed Weights   04/15/24 1612  Weight: 68.5 kg    Scheduled Meds:  ALPRAZolam   0.5 mg Oral QHS   amLODipine   10 mg Oral Daily   aspirin  EC  81 mg Oral Daily   enoxaparin  (LOVENOX ) injection  40 mg Subcutaneous Q24H   furosemide   20 mg Intravenous BID   metoprolol  succinate  12.5 mg Oral Daily   pantoprazole   40 mg Oral BID   rosuvastatin   40 mg Oral Daily   Continuous Infusions:  ceFEPime  (MAXIPIME )  IV 2 g (04/17/24 1047)   linezolid  (ZYVOX ) IV 600 mg (04/17/24 1045)    Nutritional status     Body mass index is 22.96 kg/m.  Data Reviewed:   CBC: Recent Labs  Lab 04/15/24 1615 04/16/24 0941 04/17/24 0507  WBC 5.9 5.4 4.7  NEUTROABS 3.3  --   --   HGB 13.5 12.1 11.6*  HCT 40.9 35.6* 34.1*  MCV 96.9 95.7 94.7  PLT 211 205 184   Basic Metabolic Panel: Recent Labs  Lab 04/15/24 1615 04/16/24 0941 04/17/24 0507  NA 139  --  139  K 4.0  --  4.8  CL 111  --  109  CO2 16*  --  21*  GLUCOSE 101*  --  101*  BUN 20  --  21  CREATININE 1.79* 1.64* 1.53*  CALCIUM  10.0  --  9.3   GFR: Estimated Creatinine  Clearance: 33.5 mL/min (A) (by C-G formula based on SCr of 1.53 mg/dL (H)). Liver Function Tests: Recent Labs  Lab 04/15/24 1615  AST 43*  ALT 48*  ALKPHOS 59  BILITOT 0.6  PROT 7.2  ALBUMIN 4.3   No results for input(s): LIPASE, AMYLASE in the last 168 hours. No results for input(s): AMMONIA in the last 168 hours. Coagulation Profile: No results for input(s): INR, PROTIME in the last 168 hours. Cardiac Enzymes: No results for input(s): CKTOTAL, CKMB, CKMBINDEX, TROPONINI in the last 168 hours. BNP (last 3 results) No results for input(s): PROBNP in the last 8760 hours. HbA1C: No results for input(s): HGBA1C in the last 72 hours. CBG: No results for input(s): GLUCAP in the last 168 hours. Lipid Profile: No results for input(s): CHOL, HDL, LDLCALC, TRIG, CHOLHDL, LDLDIRECT in the last 72 hours. Thyroid  Function Tests: No results for input(s): TSH, T4TOTAL, FREET4, T3FREE, THYROIDAB in the last 72 hours. Anemia Panel: No results for input(s): VITAMINB12, FOLATE, FERRITIN, TIBC, IRON, RETICCTPCT in the last 72 hours. Sepsis Labs: Recent Labs  Lab 04/15/24 1626  LATICACIDVEN 1.2    Recent Results (from the past 240 hours)  Culture, blood (Routine X 2) w Reflex to ID Panel     Status: None (Preliminary result)   Collection Time: 04/16/24  7:29 PM   Specimen: BLOOD LEFT HAND  Result Value Ref Range Status   Specimen Description BLOOD LEFT HAND  Final   Special Requests   Final    BOTTLES DRAWN AEROBIC AND ANAEROBIC Blood Culture adequate volume   Culture   Final    NO GROWTH < 12 HOURS Performed at [facility], [address] [place], [place] [address]    Report Status PENDING  Incomplete  Culture, blood (Routine X 2) w Reflex to ID Panel     Status: None (Preliminary result)   Collection Time: 04/16/24  7:33 PM   Specimen: BLOOD LEFT ARM  Result Value Ref Range Status   Specimen Description BLOOD LEFT  ARM  Final   Special Requests   Final    BOTTLES DRAWN AEROBIC AND ANAEROBIC Blood Culture adequate volume   Culture   Final    NO GROWTH < 12 HOURS Performed at [facility], [address], [place], [place] [address]    Report Status PENDING  Incomplete         Radiology Studies: MR FOOT LEFT W WO CONTRAST Result Date: 04/16/2024 CLINICAL DATA:  Osteomyelitis of the foot EXAM: MRI OF THE LEFT FOREFOOT WITHOUT AND WITH CONTRAST TECHNIQUE: Multiplanar, multisequence MR imaging  of the left foot from the midfoot through the toes was performed both before and after administration of intravenous contrast. CONTRAST:  6mL GADAVIST  GADOBUTROL  1 MMOL/ML IV SOLN COMPARISON:  Radiographs 04/16/2024 FINDINGS: Bones/Joint/Cartilage Edema distally in the medial sesamoid of the first digit suggesting mild sesamoiditis. Low-grade dorsal marrow edema at the articulation of the middle cuneiform and the second metatarsal favoring arthropathy. Mild degenerative subcortical cyst dorsally along the articulation of the cuboid adjacent to the fourth metatarsal base. No specific indicators of osteomyelitis. Ligaments Lisfranc ligament appears intact. Muscles and Tendons Trace flexor peritendinitis, image 36 series 5. Soft tissues Dorsal subcutaneous edema in the forefoot extending into the base of the toes, nonspecific for cellulitis versus sterile edema. IMPRESSION: 1. No specific indicators of osteomyelitis. 2. Dorsal subcutaneous edema in the forefoot extending into the base of the toes, nonspecific for cellulitis versus sterile edema. 3. Trace flexor peritendinitis. 4. Mild sesamoiditis of the medial sesamoid of the first digit. 5. Mild degenerative findings in the midfoot. Electronically Signed   By: Ryan Salvage M.D.   On: 04/16/2024 17:53   DG Foot 2 Views Left Result Date: 04/16/2024 EXAM: 2 VIEW(S) XRAY OF THE LEFT FOOT 04/16/2024 08:44:41 AM COMPARISON: None available. CLINICAL HISTORY: Cellulitis,  swelling. Per chart - Pt came in d/t bitten by bugs while camping last week \\T \ a bite that was in between her toes on the Lt foot became infected. Has took oral ABT for it \\T \ yesterday seen a doctor \\T \ advised her to come into ED d/t worsening infection/Cellulitis \\T \ the need for IV ABT (per pt). Lt foot is swollen, red \\T \ warm to the touch. A/Ox4, rates her pain 1/10 does throb at times, denies any drainage. FINDINGS: BONES AND JOINTS: No acute fracture. No focal osseous lesion. No joint dislocation. Midfoot degenerative changes with dorsal osteophytes. SOFT TISSUES: Forefoot dorsal soft tissue swelling. IMPRESSION: 1. Left forefoot dorsal soft tissue swelling, consistent with cellulitis. 2. Left midfoot degenerative changes with dorsal osteophytes. Electronically signed by: Waddell Calk MD 04/16/2024 09:13 AM EDT RP Workstation: [id number]        LOS: 1 day   Time spent= 40 mins    Deliliah Room, MD [facility]  If 7PM-7AM, please contact night-coverage  04/17/2024, 12:22 PM

## 2024-04-17 NOTE — Plan of Care (Signed)

## 2024-04-18 ENCOUNTER — Telehealth (HOSPITAL_COMMUNITY): Payer: Self-pay | Admitting: Pharmacy Technician

## 2024-04-18 ENCOUNTER — Other Ambulatory Visit (HOSPITAL_COMMUNITY): Payer: Self-pay

## 2024-04-18 DIAGNOSIS — L03116 Cellulitis of left lower limb: Secondary | ICD-10-CM | POA: Diagnosis not present

## 2024-04-18 MED ORDER — LINEZOLID 600 MG PO TABS
600.0000 mg | ORAL_TABLET | Freq: Two times a day (BID) | ORAL | 0 refills | Status: AC
  Filled 2024-04-18: qty 10, 5d supply, fill #0

## 2024-04-18 MED ORDER — LINEZOLID 600 MG PO TABS
600.0000 mg | ORAL_TABLET | Freq: Two times a day (BID) | ORAL | Status: DC
  Administered 2024-04-18: 600 mg via ORAL
  Filled 2024-04-18: qty 1

## 2024-04-18 NOTE — TOC Transition Note (Signed)
 Transition of Care [facility]) - Discharge Note   Patient Details  Name: Laura Herrera MRN: [id number] Date of Birth: 1950/11/28  [facility] CM/SW Contact:  Roxie KANDICE Stain, RN Phone Number: 04/18/2024, 12:04 PM   Clinical Narrative:    Laura Herrera is stable to discharge home. Follow up apt on [facility]S. No ICM (Inpatient Care Management) needs at this time.    Final next level of care: Home/Self Care Barriers to Discharge: Barriers Resolved   Patient Goals and CMS Choice Patient states their goals for this hospitalization and ongoing recovery are:: return home          Discharge Placement                 home      Discharge Plan and Services Additional resources added to the After Visit Summary for                                       Social Drivers of Health (SDOH) Interventions SDOH Screenings   Food Insecurity: No Food Insecurity (04/16/2024)  Housing: Low Risk  (04/16/2024)  Transportation Needs: No Transportation Needs (04/16/2024)  Utilities: Not At Risk (04/16/2024)  Social Connections: Unknown (04/17/2024)  Tobacco Use: Medium Risk (04/15/2024)     Readmission Risk Interventions    04/18/2024   12:04 PM  Readmission Risk Prevention Plan  Post Dischage Appt Complete  Medication Screening Complete  Transportation Screening Complete

## 2024-04-18 NOTE — Telephone Encounter (Signed)
 Patient Product/process development scientist completed.    The patient is insured through [facility] [employer]. Patient has Medicare and is not eligible for a copay card, but may be able to apply for patient assistance or Medicare RX Payment Plan (Patient Must reach out to their plan, if eligible for payment plan), if available.    Ran test claim for linezolid  600 mg tablets and Requires Prior Authorization   This test claim was processed through [facility]- copay amounts may vary at other pharmacies due to [employer], or as the patient moves through the different stages of their insurance plan.     Reyes Sharps, CPHT Pharmacy Technician III Certified Patient Advocate [facility] Patient Advocate Team Direct Number: [phone]  Fax: [phone]

## 2024-04-18 NOTE — Plan of Care (Signed)
  Problem: Education: Goal: Knowledge of General Education information will improve Description: Including pain rating scale, medication(s)/side effects and non-pharmacologic comfort measures Outcome: Progressing   Problem: Clinical Measurements: Goal: Will remain free from infection Outcome: Progressing   Problem: Clinical Measurements: Goal: Diagnostic test results will improve Outcome: Progressing   Problem: Coping: Goal: Level of anxiety will decrease Outcome: Progressing   Problem: Elimination: Goal: Will not experience complications related to bowel motility Outcome: Progressing Goal: Will not experience complications related to urinary retention Outcome: Progressing   Problem: Safety: Goal: Ability to remain free from injury will improve Outcome: Progressing

## 2024-04-18 NOTE — Telephone Encounter (Signed)
 Pharmacy Patient Advocate Encounter  Received notification from [facility] that Prior Authorization for Linezolid  600MG  tablets  has been APPROVED from 04/18/2024 to 07/18/2024. Ran test claim, Copay is $27.14. This test claim was processed through [facility]- copay amounts may vary at other pharmacies due to pharmacy/plan contracts, or as the patient moves through the different stages of their insurance plan.   PA #/Case ID/Reference #: [id number]

## 2024-04-18 NOTE — Telephone Encounter (Signed)
 Pharmacy Patient Advocate Encounter   Received notification from Inpatient Request that prior authorization for Linezolid  600MG  tablets  is required/requested.   Insurance verification completed.   The patient is insured through [facility] .   Per test claim: PA required; PA submitted to above mentioned insurance via Latent Key/confirmation #/EOC [id number] Status is pending

## 2024-04-18 NOTE — Discharge Summary (Addendum)
 Physician Discharge Summary   Patient: Laura Herrera MRN: [id number] DOB: 04-21-1951  Admit date:     04/15/2024  Discharge date: 04/18/24  Discharge Physician: Deliliah Room   PCP: Regino Slater, MD   Recommendations at discharge:    Follow up with your PCP in one week.  Discharge Diagnoses: Principal Problem:   Cellulitis   Hospital Course:  73 y.o. female with medical history significant of STEMI (01/2015, s/p RCA PCI) with post MI angina (?spasm), HTN, HLD, depression, anxiety, and alpha-gal allergy p/w L foot cellulitis. Started on IV antibiotics and symptoms improved. No evidence of OM on MRI left foot. Discharged on 5 days of oral zyvox . Pharmacy got the pre-auth on it and copay is $27. She will follow up with her PCP in one week.      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   Other Other (See Comments), Rash   Sensitive to the electrodes Tears skin off, and paper tape also   Adhesive [tape] Other (See Comments)   Tears skin off, and paper tape also   Alpha-gal Other (See Comments)   No longer has this    Shellfish-derived Products Swelling   Crabs, lobster, crayfish; tissues in joints swelled   Chlorhexidine  Gluconate Rash   CHG contains glycerin which patient has an allergy to.   Chlorhexidine  Gluconate Dermatitis   CHG contains glycerin which patient has an allergy to.   Epinephrine  Palpitations   Palpitations and involuntary body jerking   Vancomycin Hives, Rash   severe         Medication List     STOP taking these medications    cephALEXin  500 MG capsule Commonly known as: Keflex    sulfamethoxazole-trimethoprim 800-160 MG tablet Commonly known as: BACTRIM DS       TAKE these  medications    acetaminophen  500 MG tablet Commonly known as: TYLENOL  Take 1,500 mg by mouth every 6 (six) hours as needed for headache, mild pain (pain score 1-3) or moderate pain (pain score 4-6).   ALPRAZolam  0.25 MG tablet Commonly known as: XANAX  Take 0.5 mg by mouth at bedtime.   amLODipine  10 MG tablet Commonly known as: NORVASC  TAKE 1 TABLET BY MOUTH EVERY DAY   aspirin  EC 81 MG tablet Take 1 tablet (81 mg total) by mouth daily.   BIOTIN PO Take 1 tablet by mouth at bedtime.   CALCIUM  PO Take 1 tablet by mouth daily.   cholecalciferol  1000 units tablet Commonly known as: VITAMIN D  Take 1,000 Units by mouth daily.   EPINEPHrine  0.3 mg/0.3 mL Soaj injection Commonly known as: EPI-PEN Inject 0.3 mg into the muscle as needed for anaphylaxis.   furosemide  20 MG tablet Commonly known as: LASIX  Take 1 tablet (20 mg total) by mouth as needed. What changed: reasons to take this   linezolid  600 MG tablet Commonly known as: ZYVOX  Take 1 tablet (600 mg total) by mouth every 12 (twelve) hours.   metoprolol  succinate 25 MG 24 hr tablet Commonly known as: TOPROL -XL TAKE 1/2 TABLET BY MOUTH EVERY DAY   mupirocin ointment 2 % Commonly known as: BACTROBAN Apply 1 Application topically daily as needed (issues on feet).   nitroGLYCERIN  0.4 MG SL tablet Commonly known as: NITROSTAT  Place 1 tablet (0.4 mg total)  under the tongue every 5 (five) minutes x 3 doses as needed for chest pain.   pantoprazole  40 MG tablet Commonly known as: PROTONIX  TAKE 1 TABLET BY MOUTH DAILY AT 12 NOON. What changed:  how much to take how to take this when to take this additional instructions   rosuvastatin  40 MG tablet Commonly known as: CRESTOR  TAKE 1 TABLET(40 MG) BY MOUTH DAILY   triamcinolone cream 0.1 % Commonly known as: KENALOG Apply 1 Application topically at bedtime.   valACYclovir 500 MG tablet Commonly known as: VALTREX Take 500 mg by mouth 2 (two) times daily as needed  (blisters).        Follow-up Information     Koirala, Dibas, MD. Schedule an appointment as soon as possible for a visit in 1 week(s).   Specialty: Family Medicine Contact information: [address] [place] [address] [phone]                Discharge Exam: Fredricka Weights   04/15/24 1612  Weight: 68.5 kg   Constitutional: NAD, calm, comfortable Eyes: PERRL, lids and conjunctivae normal ENMT: Mucous membranes are moist. Posterior pharynx clear of any exudate or lesions.Normal dentition.  Neck: normal, supple, no masses, no thyromegaly Respiratory: clear to auscultation bilaterally, no wheezing, no crackles. Normal respiratory effort. No accessory muscle use.  Cardiovascular: Regular rate and rhythm, no murmurs / rubs / gallops. No extremity edema. 2+ pedal pulses. No carotid bruits.  Abdomen: no tenderness, no masses palpated. No hepatosplenomegaly. Bowel sounds positive.  Skin: Left foot swelling has resolved, erythema has significantly decreased, no tenderness Neurologic: CN 2-12 grossly intact. Sensation intact, DTR normal. Strength 5/5 x all 4 extremities.  Psychiatric: Normal judgment and insight. Alert and oriented x 3. Normal mood.    Condition at discharge: good  The results of significant diagnostics from this hospitalization (including imaging, microbiology, ancillary and laboratory) are listed below for reference.   Imaging Studies: DG Elbow 2 Views Left Result Date: 04/17/2024 EXAM: 1 or 2 VIEW(S) XRAY OF THE LEFT ELBOW COMPARISON: None available. CLINICAL HISTORY: 13689 Swelling [id number]. Lt elbow swelling. 13689 Swelling 13689. Lt elbow swelling. FINDINGS: BONES AND JOINTS: No acute fracture. No focal osseous lesion. No joint dislocation. No joint effusion. SOFT TISSUES: The soft tissues are unremarkable. IMPRESSION: 1. No acute abnormality. Electronically signed by: Norman Gatlin MD 04/17/2024 10:29 PM EDT RP Workstation: [id number]    MR FOOT LEFT W WO CONTRAST Result Date: 04/16/2024 CLINICAL DATA:  Osteomyelitis of the foot EXAM: MRI OF THE LEFT FOREFOOT WITHOUT AND WITH CONTRAST TECHNIQUE: Multiplanar, multisequence MR imaging of the left foot from the midfoot through the toes was performed both before and after administration of intravenous contrast. CONTRAST:  6mL GADAVIST  GADOBUTROL  1 MMOL/ML IV SOLN COMPARISON:  Radiographs 04/16/2024 FINDINGS: Bones/Joint/Cartilage Edema distally in the medial sesamoid of the first digit suggesting mild sesamoiditis. Low-grade dorsal marrow edema at the articulation of the middle cuneiform and the second metatarsal favoring arthropathy. Mild degenerative subcortical cyst dorsally along the articulation of the cuboid adjacent to the fourth metatarsal base. No specific indicators of osteomyelitis. Ligaments Lisfranc ligament appears intact. Muscles and Tendons Trace flexor peritendinitis, image 36 series 5. Soft tissues Dorsal subcutaneous edema in the forefoot extending into the base of the toes, nonspecific for cellulitis versus sterile edema. IMPRESSION: 1. No specific indicators of osteomyelitis. 2. Dorsal subcutaneous edema in the forefoot extending into the base of the toes, nonspecific for cellulitis versus sterile edema. 3. Trace flexor peritendinitis.  4. Mild sesamoiditis of the medial sesamoid of the first digit. 5. Mild degenerative findings in the midfoot. Electronically Signed   By: Ryan Salvage M.D.   On: 04/16/2024 17:53   DG Foot 2 Views Left Result Date: 04/16/2024 EXAM: 2 VIEW(S) XRAY OF THE LEFT FOOT 04/16/2024 08:44:41 AM COMPARISON: None available. CLINICAL HISTORY: Cellulitis, swelling. Per chart - Pt came in d/t bitten by bugs while camping last week \\T \ a bite that was in between her toes on the Lt foot became infected. Has took oral ABT for it \\T \ yesterday seen a doctor \\T \ advised her to come into ED d/t worsening infection/Cellulitis \\T \ the need for IV ABT (per pt).  Lt foot is swollen, red \\T \ warm to the touch. A/Ox4, rates her pain 1/10 does throb at times, denies any drainage. FINDINGS: BONES AND JOINTS: No acute fracture. No focal osseous lesion. No joint dislocation. Midfoot degenerative changes with dorsal osteophytes. SOFT TISSUES: Forefoot dorsal soft tissue swelling. IMPRESSION: 1. Left forefoot dorsal soft tissue swelling, consistent with cellulitis. 2. Left midfoot degenerative changes with dorsal osteophytes. Electronically signed by: Waddell Calk MD 04/16/2024 09:13 AM EDT RP Workstation: [id number]   VAS US  CAROTID Result Date: 03/26/2024 Carotid Arterial Duplex Study Patient Name:  AMBERT VIRRUETA  Date of Exam:   03/25/2024 Medical Rec #: [id number]         Accession #:    [id number] Date of Birth: 1950/09/22         Patient Gender: F Patient Age:   89 years Exam Location:  [address] Procedure:      VAS US  CAROTID Referring Phys: DAVID HARDING --------------------------------------------------------------------------------  Indications:       Left bruit, Carotid artery disease and patient denies any                    cerebrovascular symptoms. Risk Factors:      Hypertension, hyperlipidemia, past history of smoking, prior                    MI, coronary artery disease. Comparison Study:  On 12/21/2022, a carotid duplex showed velocities of 92/12                    cm/s in the RICA and 188/20 cm/s in the LICA. Performing Technologist: Nanetta Shad RVT  Examination Guidelines: A complete evaluation includes B-mode imaging, spectral Doppler, color Doppler, and power Doppler as needed of all accessible portions of each vessel. Bilateral testing is considered an integral part of a complete examination. Limited examinations for reoccurring indications may be performed as noted.  Right Carotid Findings: +----------+--------+--------+--------+-----------------+----------------------+           PSV cm/sEDV cm/sStenosisPlaque           Comments                                                  Description                             +----------+--------+--------+--------+-----------------+----------------------+ CCA Prox  132     0                                tortuous and  turbulent +----------+--------+--------+--------+-----------------+----------------------+ CCA Distal85      10                                                      +----------+--------+--------+--------+-----------------+----------------------+ ICA Prox  122     10      1-39%   heterogenous                            +----------+--------+--------+--------+-----------------+----------------------+ ICA Distal70      12                                                      +----------+--------+--------+--------+-----------------+----------------------+ ECA       310     0       >50%    heterogenous                            +----------+--------+--------+--------+-----------------+----------------------+ +----------+--------+-------+----------------------+-------------------+           PSV cm/sEDV cmsDescribe              Arm Pressure (mmHG) +----------+--------+-------+----------------------+-------------------+ [id number]     0      Stenotic and Turbulent103                 +----------+--------+-------+----------------------+-------------------+ +---------+--------+--+--------+-+----------------------+ VertebralPSV cm/s74EDV cm/s7Antegrade and Atypical +---------+--------+--+--------+-+----------------------+  Left Carotid Findings: +----------+--------+--------+--------+---------------------+------------------+           PSV cm/sEDV cm/sStenosisPlaque Description   Comments           +----------+--------+--------+--------+---------------------+------------------+ CCA Prox  63      5                                                       +----------+--------+--------+--------+---------------------+------------------+ CCA  Distal103     10                                                      +----------+--------+--------+--------+---------------------+------------------+ ICA Prox  193     9               heterogenous and                                                          irregular                               +----------+--------+--------+--------+---------------------+------------------+ ICA Mid   203     22      40-59%  heterogenous and     stenosis based on  irregular            peak systolic                                                             velocities and                                                            plaque formation   +----------+--------+--------+--------+---------------------+------------------+ ICA Distal126     18                                   tortuous           +----------+--------+--------+--------+---------------------+------------------+ ECA       378     0       >50%    heterogenous                            +----------+--------+--------+--------+---------------------+------------------+ +----------+--------+--------+----------------+-------------------+           PSV cm/sEDV cm/sDescribe        Arm Pressure (mmHG) +----------+--------+--------+----------------+-------------------+ [id number]     0       Multiphasic, [id number]                 +----------+--------+--------+----------------+-------------------+ +---------+--------+--+--------+--+---------+ VertebralPSV cm/s69EDV cm/s13Antegrade +---------+--------+--+--------+--+---------+   Summary: Right Carotid: Velocities in the right ICA are consistent with a 1-39% stenosis.                Non-hemodynamically significant plaque <50% noted in the CCA. The                ECA appears >50% stenosed. Left Carotid: Velocities in the left ICA are consistent with a 40-59% stenosis.               Non-hemodynamically significant  plaque <50% noted in the CCA. The               ECA appears >50% stenosed. LICA stenosis based on peak systolic               velocities and plaque formation. Vertebrals: Left vertebral artery demonstrates antegrade flow. Atypical             antegrade flow in the right vertebral artery. *See table(s) above for measurements and observations. Suggest follow up study in 12 months. Electronically signed by Evalene Lunger MD on 03/26/2024 at 3:00:27 PM.    Final     Microbiology: Results for orders placed or performed during the hospital encounter of 04/15/24  Culture, blood (Routine X 2) w Reflex to ID Panel     Status: None (Preliminary result)   Collection Time: 04/16/24  7:29 PM   Specimen: BLOOD LEFT HAND  Result Value Ref Range Status   Specimen Description BLOOD LEFT HAND  Final   Special Requests   Final    BOTTLES DRAWN AEROBIC AND ANAEROBIC Blood Culture adequate volume   Culture   Final    NO GROWTH 2 DAYS Performed at [facility]  [facility], [address] [place], [place] [address]    Report Status PENDING  Incomplete  Culture, blood (Routine X 2) w Reflex to ID Panel     Status: None (Preliminary result)   Collection Time: 04/16/24  7:33 PM   Specimen: BLOOD LEFT ARM  Result Value Ref Range Status   Specimen Description BLOOD LEFT ARM  Final   Special Requests   Final    BOTTLES DRAWN AEROBIC AND ANAEROBIC Blood Culture adequate volume   Culture   Final    NO GROWTH 2 DAYS Performed at [facility], [address], [place], [place] [address]    Report Status PENDING  Incomplete    Labs: CBC: Recent Labs  Lab 04/15/24 1615 04/16/24 0941 04/17/24 0507  WBC 5.9 5.4 4.7  NEUTROABS 3.3  --   --   HGB 13.5 12.1 11.6*  HCT 40.9 35.6* 34.1*  MCV 96.9 95.7 94.7  PLT 211 205 184   Basic Metabolic Panel: Recent Labs  Lab 04/15/24 1615 04/16/24 0941 04/17/24 0507  NA 139  --  139  K 4.0  --  4.8  CL 111  --  109  CO2 16*  --  21*  GLUCOSE 101*  --  101*  BUN 20   --  21  CREATININE 1.79* 1.64* 1.53*  CALCIUM  10.0  --  9.3   Liver Function Tests: Recent Labs  Lab 04/15/24 1615  AST 43*  ALT 48*  ALKPHOS 59  BILITOT 0.6  PROT 7.2  ALBUMIN 4.3   CBG: No results for input(s): GLUCAP in the last 168 hours.  Discharge time spent: 42 minutes.  Signed: Deliliah Room, MD [facility] 04/18/2024

## 2024-04-21 LAB — CULTURE, BLOOD (ROUTINE X 2)
Culture: NO GROWTH
Culture: NO GROWTH
Special Requests: ADEQUATE
Special Requests: ADEQUATE

## 2024-04-25 DIAGNOSIS — L03116 Cellulitis of left lower limb: Secondary | ICD-10-CM | POA: Diagnosis not present

## 2024-04-25 DIAGNOSIS — I10 Essential (primary) hypertension: Secondary | ICD-10-CM | POA: Diagnosis not present

## 2024-04-27 ENCOUNTER — Encounter: Payer: Self-pay | Admitting: Cardiology

## 2024-04-30 ENCOUNTER — Telehealth: Payer: Self-pay | Admitting: Cardiology

## 2024-04-30 MED ORDER — NITROGLYCERIN 0.4 MG SL SUBL: 0.4000 mg | SUBLINGUAL_TABLET | SUBLINGUAL | 6 refills | Status: AC | PRN

## 2024-04-30 NOTE — Telephone Encounter (Signed)
 Pt's medication was sent to pt's pharmacy as requested. Confirmation received.

## 2024-04-30 NOTE — Telephone Encounter (Signed)
*  STAT* If patient is at the pharmacy, call can be transferred to refill team.   1. Which medications need to be refilled? (please list name of each medication and dose if known)   nitroGLYCERIN  (NITROSTAT ) 0.4 MG SL tablet   2. Would you like to learn more about the convenience, safety, & potential cost savings by using the [facility]?   3. Are you open to using the [facility] (Type [facility]. ).  4. Which pharmacy/location (including street and city if local pharmacy) is medication to be sent to?  [id number] - [place], [place] - [address] AT [address]   5. Do they need a 30 day or 90 day supply?   Patient stated her prescription is very old.

## 2024-05-14 DIAGNOSIS — K2950 Unspecified chronic gastritis without bleeding: Secondary | ICD-10-CM | POA: Diagnosis not present

## 2024-05-14 DIAGNOSIS — Z1211 Encounter for screening for malignant neoplasm of colon: Secondary | ICD-10-CM | POA: Diagnosis not present

## 2024-05-16 ENCOUNTER — Other Ambulatory Visit: Payer: Medicare Other

## 2024-05-16 DIAGNOSIS — Z79899 Other long term (current) drug therapy: Secondary | ICD-10-CM | POA: Diagnosis not present

## 2024-05-16 DIAGNOSIS — Z0001 Encounter for general adult medical examination with abnormal findings: Secondary | ICD-10-CM | POA: Diagnosis not present

## 2024-05-16 DIAGNOSIS — I10 Essential (primary) hypertension: Secondary | ICD-10-CM | POA: Diagnosis not present

## 2024-05-16 DIAGNOSIS — R7301 Impaired fasting glucose: Secondary | ICD-10-CM | POA: Diagnosis not present

## 2024-05-16 DIAGNOSIS — Z1331 Encounter for screening for depression: Secondary | ICD-10-CM | POA: Diagnosis not present

## 2024-05-16 DIAGNOSIS — E7800 Pure hypercholesterolemia, unspecified: Secondary | ICD-10-CM | POA: Diagnosis not present

## 2024-05-19 ENCOUNTER — Other Ambulatory Visit: Payer: Self-pay | Admitting: Family Medicine

## 2024-05-19 DIAGNOSIS — R911 Solitary pulmonary nodule: Secondary | ICD-10-CM

## 2024-05-20 ENCOUNTER — Ambulatory Visit (HOSPITAL_BASED_OUTPATIENT_CLINIC_OR_DEPARTMENT_OTHER)
Admission: RE | Admit: 2024-05-20 | Discharge: 2024-05-20 | Disposition: A | Discharge status: Home / Self Care | Source: Ambulatory Visit | Attending: Family Medicine | Admitting: Family Medicine

## 2024-05-20 DIAGNOSIS — E2839 Other primary ovarian failure: Secondary | ICD-10-CM | POA: Insufficient documentation

## 2024-05-20 DIAGNOSIS — M8589 Other specified disorders of bone density and structure, multiple sites: Secondary | ICD-10-CM | POA: Diagnosis not present

## 2024-05-20 DIAGNOSIS — Z780 Asymptomatic menopausal state: Secondary | ICD-10-CM | POA: Diagnosis not present

## 2024-05-21 ENCOUNTER — Inpatient Hospital Stay: Admission: RE | Admit: 2024-05-21 | Discharge: 2024-05-21 | Attending: Family Medicine | Admitting: Family Medicine

## 2024-05-21 DIAGNOSIS — R911 Solitary pulmonary nodule: Secondary | ICD-10-CM

## 2024-06-05 DIAGNOSIS — H43812 Vitreous degeneration, left eye: Secondary | ICD-10-CM | POA: Diagnosis not present

## 2024-06-12 DIAGNOSIS — N83292 Other ovarian cyst, left side: Secondary | ICD-10-CM | POA: Diagnosis not present

## 2024-06-16 DIAGNOSIS — M79644 Pain in right finger(s): Secondary | ICD-10-CM | POA: Diagnosis not present

## 2024-06-16 DIAGNOSIS — M79645 Pain in left finger(s): Secondary | ICD-10-CM | POA: Diagnosis not present

## 2024-06-20 DIAGNOSIS — K6289 Other specified diseases of anus and rectum: Secondary | ICD-10-CM | POA: Diagnosis not present

## 2024-06-20 DIAGNOSIS — Z1211 Encounter for screening for malignant neoplasm of colon: Secondary | ICD-10-CM | POA: Diagnosis not present

## 2024-06-20 DIAGNOSIS — K5730 Diverticulosis of large intestine without perforation or abscess without bleeding: Secondary | ICD-10-CM | POA: Diagnosis not present

## 2024-07-07 DIAGNOSIS — M79645 Pain in left finger(s): Secondary | ICD-10-CM | POA: Diagnosis not present

## 2024-07-07 DIAGNOSIS — M180 Bilateral primary osteoarthritis of first carpometacarpal joints: Secondary | ICD-10-CM | POA: Diagnosis not present

## 2024-07-08 DIAGNOSIS — K080 Exfoliation of teeth due to systemic causes: Secondary | ICD-10-CM | POA: Diagnosis not present

## 2024-07-15 DIAGNOSIS — N83202 Unspecified ovarian cyst, left side: Secondary | ICD-10-CM | POA: Diagnosis not present

## 2024-07-16 DIAGNOSIS — M13841 Other specified arthritis, right hand: Secondary | ICD-10-CM | POA: Diagnosis not present
